# Patient Record
Sex: Male | Born: 1960 | Race: White | Hispanic: No | Smoking: Current every day smoker
Health system: Southern US, Community
[De-identification: ages and names within clinical notes are randomized; demographics above are authoritative.]

## PROBLEM LIST (undated history)

## (undated) DIAGNOSIS — A6920 Lyme disease, unspecified: Secondary | ICD-10-CM

## (undated) DIAGNOSIS — A770 Spotted fever due to Rickettsia rickettsii: Secondary | ICD-10-CM

## (undated) DIAGNOSIS — K7460 Unspecified cirrhosis of liver: Secondary | ICD-10-CM

## (undated) DIAGNOSIS — I8500 Esophageal varices without bleeding: Secondary | ICD-10-CM

## (undated) DIAGNOSIS — E119 Type 2 diabetes mellitus without complications: Secondary | ICD-10-CM

## (undated) DIAGNOSIS — F419 Anxiety disorder, unspecified: Secondary | ICD-10-CM

## (undated) DIAGNOSIS — E7800 Pure hypercholesterolemia, unspecified: Secondary | ICD-10-CM

## (undated) HISTORY — DX: Type 2 diabetes mellitus without complications: E11.9

## (undated) HISTORY — DX: Esophageal varices without bleeding: I85.00

## (undated) HISTORY — DX: Unspecified cirrhosis of liver: K74.60

## (undated) HISTORY — DX: Spotted fever due to Rickettsia rickettsii: A77.0

## (undated) HISTORY — DX: Anxiety disorder, unspecified: F41.9

## (undated) HISTORY — PX: HERNIA REPAIR: SHX51

## (undated) HISTORY — DX: Pure hypercholesterolemia, unspecified: E78.00

## (undated) HISTORY — DX: Lyme disease, unspecified: A69.20

## (undated) HISTORY — PX: ESOPHAGEAL VARICE LIGATION: SHX625

---

## 2013-06-09 ENCOUNTER — Telehealth: Payer: Self-pay | Admitting: Family Medicine

## 2013-06-09 NOTE — Telephone Encounter (Signed)
APPT MADE

## 2013-06-13 ENCOUNTER — Ambulatory Visit (INDEPENDENT_AMBULATORY_CARE_PROVIDER_SITE_OTHER): Payer: BC Managed Care – PPO | Admitting: Family Medicine

## 2013-06-13 ENCOUNTER — Encounter: Payer: Self-pay | Admitting: Family Medicine

## 2013-06-13 ENCOUNTER — Encounter (INDEPENDENT_AMBULATORY_CARE_PROVIDER_SITE_OTHER): Payer: Self-pay

## 2013-06-13 VITALS — BP 106/59 | HR 81 | Temp 97.7°F | Ht 69.0 in | Wt 183.0 lb

## 2013-06-13 DIAGNOSIS — K7460 Unspecified cirrhosis of liver: Secondary | ICD-10-CM

## 2013-06-13 NOTE — Patient Instructions (Signed)
Cirrhosis  Cirrhosis is a condition of scarring of the liver which is caused when the liver has tried repairing itself following damage. This damage may come from a previous infection such as one of the forms of hepatitis (usually hepatitis C), or the damage may come from being injured by toxins. The main toxin that causes this damage is alcohol. The scarring of the liver from use of alcohol is irreversible. That means the liver cannot return to normal even though alcohol is not used any more. The main danger of hepatitis C infection is that it may cause long-lasting (chronic) liver disease, and this also may lead to cirrhosis. This complication is progressive and irreversible.  CAUSES   Prior to available blood tests, hepatitis C could be contracted by blood transfusions. Since testing of blood has improved, this is now unlikely. This infection can also be contracted through intravenous drug use and the sharing of needles. It can also be contracted through sexual relationships. The injury caused by alcohol comes from too much use. It is not a few drinks that poison the liver, but years of misuse. Usually there will be some signs and symptoms early with scarring of the liver that suggest the development of better habits. Alcohol should never be used while using acetaminophen. A small dose of both taken together may cause irreversible damage to the liver.  HOME CARE INSTRUCTIONS   There is no specific treatment for cirrhosis. However, there are things you can do to avoid making the condition worse.  · Rest as needed.  · Eat a well-balanced diet. Your caregiver can help you with suggestions.  · Vitamin supplements including vitamins A, K, D, and thiamine can help.  · A low-salt diet, water restriction, or diuretic medicine may be needed to reduce fluid retention.  · Avoid alcohol. This can be extremely toxic if combined with acetaminophen.  · Avoid drugs which are toxic to the liver. Some of these include isoniazid,  methyldopa, acetaminophen, anabolic steroids (muscle-building drugs), erythromycin, and oral contraceptives (birth control pills). Check with your caregiver to make sure medicines you are presently taking will not be harmful.  · Periodic blood tests may be required. Follow your caregiver's advice regarding the timing of these.  · Milk thistle is an herbal remedy which does protect the liver against toxins. However, it will not help once the liver has been scarred.  SEEK MEDICAL CARE IF:  · You have increasing fatigue or weakness.  · You develop swelling of the hands, feet, legs, or face.  · You vomit bright red blood, or a coffee ground appearing material.  · You have blood in your stools, or the stools turn black and tarry.  · You have a fever.  · You develop loss of appetite, or have nausea and vomiting.  · You develop jaundice.  · You develop easy bruising or bleeding.  · You have worsening of any of the problems you are concerned about.  Document Released: 05/01/2005 Document Revised: 07/24/2011 Document Reviewed: 12/18/2007  ExitCare® Patient Information ©2014 [employer].

## 2013-06-13 NOTE — Progress Notes (Signed)
   Subjective:    Patient ID: Spencer Stanley, male    DOB: 08-03-1960, 53 y.o.   MRN: [id number]  HPI This 53 y.o. male presents for evaluation of establishment of care.  He has hx of cirrhosis. He was living in [place]. And has moved to [facility] and was being seen by GI in [place] and He states he was told to have his care with GI transferred to [facility].  He states his MELD numbers Are 8 and improved after doing TIPS surgery to bypass liver and decrease hepatic portal tension   Review of Systems No chest pain, SOB, HA, dizziness, vision change, N/V, diarrhea, constipation, dysuria, urinary urgency or frequency, myalgias, arthralgias or rash.     Objective:   Physical Exam Vital signs noted  Well developed well nourished male.  HEENT - Head atraumatic Normocephalic                Eyes - PERRLA, Conjuctiva - clear Sclera- Clear EOMI                Ears - EAC's Wnl TM's Wnl Gross Hearing WNL                Nose - Nares patent                 Throat - oropharanx wnl Respiratory - Lungs CTA bilateral Cardiac - RRR S1 and S2 without murmur GI - Abdomen soft Nontender and bowel sounds active x 4 Extremities - No edema. Neuro - Grossly intact.       Assessment & Plan:  Cirrhosis of liver without mention of alcohol - Plan: Ambulatory referral to Gastroenterology  Refer to GI at [facility] and follow up in 3 months  Lysbeth Penner FNP

## 2013-06-18 ENCOUNTER — Other Ambulatory Visit (INDEPENDENT_AMBULATORY_CARE_PROVIDER_SITE_OTHER): Payer: BC Managed Care – PPO

## 2013-06-18 DIAGNOSIS — K7200 Acute and subacute hepatic failure without coma: Secondary | ICD-10-CM

## 2013-06-18 NOTE — Progress Notes (Signed)
Fax results to [place] [facility]   [phone]

## 2013-06-19 LAB — CMP14+EGFR
ALT: 22 IU/L (ref 0–44)
AST: 30 IU/L (ref 0–40)
Albumin/Globulin Ratio: 1.4 (ref 1.1–2.5)
Albumin: 4 g/dL (ref 3.5–5.5)
Alkaline Phosphatase: 174 IU/L — ABNORMAL HIGH (ref 39–117)
BUN/Creatinine Ratio: 13 (ref 9–20)
BUN: 12 mg/dL (ref 6–24)
CO2: 20 mmol/L (ref 18–29)
Calcium: 9.5 mg/dL (ref 8.7–10.2)
Chloride: 105 mmol/L (ref 97–108)
Creatinine, Ser: 0.93 mg/dL (ref 0.76–1.27)
GFR calc Af Amer: 108 mL/min/{1.73_m2} (ref >59)
GFR calc non Af Amer: 93 mL/min/{1.73_m2} (ref >59)
Globulin, Total: 2.9 g/dL (ref 1.5–4.5)
Glucose: 111 mg/dL — ABNORMAL HIGH (ref 65–99)
Potassium: 4.5 mmol/L (ref 3.5–5.2)
Sodium: 139 mmol/L (ref 134–144)
Total Bilirubin: 0.9 mg/dL (ref 0.0–1.2)
Total Protein: 6.9 g/dL (ref 6.0–8.5)

## 2013-06-19 LAB — CBC WITH DIFFERENTIAL
Basophils Absolute: 0 10*3/uL (ref 0.0–0.2)
Basos: 1 %
Eos: 4 %
Eosinophils Absolute: 0.2 10*3/uL (ref 0.0–0.4)
HCT: 35.1 % — ABNORMAL LOW (ref 37.5–51.0)
Hemoglobin: 11.3 g/dL — ABNORMAL LOW (ref 12.6–17.7)
Immature Grans (Abs): 0 10*3/uL (ref 0.0–0.1)
Immature Granulocytes: 0 %
Lymphocytes Absolute: 1.2 10*3/uL (ref 0.7–3.1)
Lymphs: 25 %
MCH: 24.3 pg — ABNORMAL LOW (ref 26.6–33.0)
MCHC: 32.2 g/dL (ref 31.5–35.7)
MCV: 76 fL — ABNORMAL LOW (ref 79–97)
Monocytes Absolute: 0.5 10*3/uL (ref 0.1–0.9)
Monocytes: 10 %
Neutrophils Absolute: 3 10*3/uL (ref 1.4–7.0)
Neutrophils Relative %: 60 %
Platelets: 77 10*3/uL — CL (ref 150–379)
RBC: 4.65 x10E6/uL (ref 4.14–5.80)
RDW: 19.3 % — ABNORMAL HIGH (ref 12.3–15.4)
WBC: 4.9 10*3/uL (ref 3.4–10.8)

## 2013-06-19 LAB — PROTIME-INR
INR: 1.3 — ABNORMAL HIGH (ref 0.8–1.2)
Prothrombin Time: 12.8 s — ABNORMAL HIGH (ref 9.1–12.0)

## 2013-08-27 ENCOUNTER — Encounter: Payer: Self-pay | Admitting: *Deleted

## 2013-09-10 ENCOUNTER — Encounter: Payer: Self-pay | Admitting: Family Medicine

## 2013-09-10 ENCOUNTER — Ambulatory Visit (INDEPENDENT_AMBULATORY_CARE_PROVIDER_SITE_OTHER): Payer: BC Managed Care – PPO | Admitting: Family Medicine

## 2013-09-10 VITALS — BP 108/67 | HR 61 | Temp 97.2°F | Ht 69.0 in | Wt 191.0 lb

## 2013-09-10 DIAGNOSIS — G4700 Insomnia, unspecified: Secondary | ICD-10-CM

## 2013-09-10 DIAGNOSIS — F32A Depression, unspecified: Secondary | ICD-10-CM

## 2013-09-10 DIAGNOSIS — K7460 Unspecified cirrhosis of liver: Secondary | ICD-10-CM

## 2013-09-10 DIAGNOSIS — F3289 Other specified depressive episodes: Secondary | ICD-10-CM

## 2013-09-10 DIAGNOSIS — K219 Gastro-esophageal reflux disease without esophagitis: Secondary | ICD-10-CM

## 2013-09-10 DIAGNOSIS — M1990 Unspecified osteoarthritis, unspecified site: Secondary | ICD-10-CM

## 2013-09-10 DIAGNOSIS — F329 Major depressive disorder, single episode, unspecified: Secondary | ICD-10-CM

## 2013-09-10 MED ORDER — MELOXICAM 15 MG PO TABS: 15.0000 mg | ORAL_TABLET | Freq: Every day | ORAL | Status: DC

## 2013-09-10 MED ORDER — FLUOXETINE HCL 40 MG PO CAPS: 40.0000 mg | ORAL_CAPSULE | Freq: Every day | ORAL | Status: DC

## 2013-09-10 MED ORDER — ZOLPIDEM TARTRATE 10 MG PO TABS: 10.0000 mg | ORAL_TABLET | Freq: Every evening | ORAL | Status: DC | PRN

## 2013-09-10 MED ORDER — LACTULOSE 10 GM/15ML PO SOLN: 30.0000 g | Freq: Three times a day (TID) | ORAL | Status: DC

## 2013-09-10 MED ORDER — PANTOPRAZOLE SODIUM 40 MG PO TBEC: 40.0000 mg | DELAYED_RELEASE_TABLET | Freq: Every day | ORAL | Status: DC

## 2013-09-10 NOTE — Progress Notes (Signed)
   Subjective:    Patient ID: Spencer Stanley, male    DOB: 28-Jul-1960, 53 y.o.   MRN: [id number]  HPI This 53 y.o. male presents for evaluation of follow up.  He has hx of cirrhosis and he has an appointment with his hepatologist. He has hx of insomnia, gerd, and OA.  He needs refills.   Review of Systems    No chest pain, SOB, HA, dizziness, vision change, N/V, diarrhea, constipation, dysuria, urinary urgency or frequency, myalgias, arthralgias or rash.  Objective:   Physical Exam  Vital signs noted  Well developed well nourished male.  HEENT - Head atraumatic Normocephalic                Eyes - PERRLA, Conjuctiva - clear Sclera- Clear EOMI                Ears - EAC's Wnl TM's Wnl Gross Hearing WNL                Nose - Nares patent                 Throat - oropharanx wnl Respiratory - Lungs CTA bilateral Cardiac - RRR S1 and S2 without murmur GI - Abdomen soft Nontender and bowel sounds active x 4 Extremities - No edema. Neuro - Grossly intact.      Assessment & Plan:  Cirrhosis - Plan: lactulose (CHRONULAC) 10 GM/15ML solution, CMP14+EGFR, CBC With differential/Platelet, CANCELED: POCT CBC  Insomnia - Plan: zolpidem (AMBIEN) 10 MG tablet, CBC With differential/Platelet  GERD (gastroesophageal reflux disease) - Plan: pantoprazole (PROTONIX) 40 MG tablet, CBC With differential/Platelet  Depression - Plan: FLUoxetine (PROZAC) 40 MG capsule, CBC With differential/Platelet  OA (osteoarthritis) - Plan: meloxicam (MOBIC) 15 MG tablet, CBC With differential/Platelet  Lysbeth Penner FNP

## 2013-09-11 ENCOUNTER — Ambulatory Visit: Payer: BC Managed Care – PPO | Admitting: Family Medicine

## 2013-09-12 ENCOUNTER — Other Ambulatory Visit (INDEPENDENT_AMBULATORY_CARE_PROVIDER_SITE_OTHER): Payer: BC Managed Care – PPO

## 2013-09-12 DIAGNOSIS — R7989 Other specified abnormal findings of blood chemistry: Secondary | ICD-10-CM

## 2013-09-12 LAB — SPECIMEN STATUS REPORT

## 2013-09-12 NOTE — Progress Notes (Signed)
Pt came in for labs only 

## 2013-09-13 LAB — CBC WITH DIFFERENTIAL
Basophils Absolute: 0 10*3/uL (ref 0.0–0.2)
Basos: 1 %
Eos: 4 %
Eosinophils Absolute: 0.2 10*3/uL (ref 0.0–0.4)
HCT: 35.2 % — ABNORMAL LOW (ref 37.5–51.0)
Hemoglobin: 11 g/dL — ABNORMAL LOW (ref 12.6–17.7)
Immature Grans (Abs): 0 10*3/uL (ref 0.0–0.1)
Immature Granulocytes: 0 %
Lymphocytes Absolute: 0.9 10*3/uL (ref 0.7–3.1)
Lymphs: 19 %
MCH: 24.4 pg — ABNORMAL LOW (ref 26.6–33.0)
MCHC: 31.3 g/dL — ABNORMAL LOW (ref 31.5–35.7)
MCV: 78 fL — ABNORMAL LOW (ref 79–97)
Monocytes Absolute: 0.6 10*3/uL (ref 0.1–0.9)
Monocytes: 12 %
Neutrophils Absolute: 3 10*3/uL (ref 1.4–7.0)
Neutrophils Relative %: 64 %
Platelets: 91 10*3/uL — CL (ref 150–379)
RBC: 4.5 x10E6/uL (ref 4.14–5.80)
RDW: 18.1 % — ABNORMAL HIGH (ref 12.3–15.4)
WBC: 4.8 10*3/uL (ref 3.4–10.8)

## 2013-09-15 LAB — CBC WITH DIFFERENTIAL

## 2013-09-16 LAB — CMP14+EGFR
ALT: 19 IU/L (ref 0–44)
AST: 29 IU/L (ref 0–40)
Albumin/Globulin Ratio: 1.4 (ref 1.1–2.5)
Albumin: 3.8 g/dL (ref 3.5–5.5)
Alkaline Phosphatase: 172 IU/L — ABNORMAL HIGH (ref 39–117)
BUN/Creatinine Ratio: 16 (ref 9–20)
BUN: 13 mg/dL (ref 6–24)
CO2: 24 mmol/L (ref 18–29)
Calcium: 8.9 mg/dL (ref 8.7–10.2)
Chloride: 104 mmol/L (ref 97–108)
Creatinine, Ser: 0.83 mg/dL (ref 0.76–1.27)
GFR calc Af Amer: 116 mL/min/{1.73_m2} (ref >59)
GFR calc non Af Amer: 100 mL/min/{1.73_m2} (ref >59)
Globulin, Total: 2.8 g/dL (ref 1.5–4.5)
Glucose: 113 mg/dL — ABNORMAL HIGH (ref 65–99)
Potassium: 4.3 mmol/L (ref 3.5–5.2)
Sodium: 140 mmol/L (ref 134–144)
Total Bilirubin: 0.9 mg/dL (ref 0.0–1.2)
Total Protein: 6.6 g/dL (ref 6.0–8.5)

## 2013-09-16 LAB — SPECIMEN STATUS REPORT

## 2013-09-22 ENCOUNTER — Telehealth: Payer: Self-pay | Admitting: Family Medicine

## 2013-09-22 NOTE — Telephone Encounter (Signed)
Message copied by Waverly Ferrari on Mon Sep 22, 2013 10:11 AM ------      Message from: Lysbeth Penner      Created: Tue Sep 16, 2013  7:14 PM       Platelets low due to cirrhosis ------

## 2013-09-26 DIAGNOSIS — Z9889 Other specified postprocedural states: Secondary | ICD-10-CM | POA: Insufficient documentation

## 2013-09-26 DIAGNOSIS — K7460 Unspecified cirrhosis of liver: Secondary | ICD-10-CM | POA: Insufficient documentation

## 2013-10-14 ENCOUNTER — Telehealth: Payer: Self-pay | Admitting: Family Medicine

## 2013-10-14 DIAGNOSIS — K219 Gastro-esophageal reflux disease without esophagitis: Secondary | ICD-10-CM

## 2013-10-14 DIAGNOSIS — F329 Major depressive disorder, single episode, unspecified: Secondary | ICD-10-CM

## 2013-10-14 DIAGNOSIS — F32A Depression, unspecified: Secondary | ICD-10-CM

## 2013-10-14 MED ORDER — FLUOXETINE HCL 40 MG PO CAPS: 40.0000 mg | ORAL_CAPSULE | Freq: Every day | ORAL | Status: DC

## 2013-10-14 MED ORDER — PANTOPRAZOLE SODIUM 40 MG PO TBEC: 40.0000 mg | DELAYED_RELEASE_TABLET | Freq: Every day | ORAL | Status: DC

## 2013-10-14 NOTE — Telephone Encounter (Signed)
This has been done and pt aware

## 2013-11-17 ENCOUNTER — Telehealth: Payer: Self-pay | Admitting: Family Medicine

## 2013-11-18 ENCOUNTER — Other Ambulatory Visit: Payer: Self-pay | Admitting: Family Medicine

## 2013-11-18 MED ORDER — RIFAXIMIN 550 MG PO TABS: 550.0000 mg | ORAL_TABLET | Freq: Two times a day (BID) | ORAL | Status: DC

## 2013-11-18 MED ORDER — SPIRONOLACTONE 25 MG PO TABS: 25.0000 mg | ORAL_TABLET | Freq: Two times a day (BID) | ORAL | Status: DC

## 2013-11-18 NOTE — Telephone Encounter (Signed)
meds sent to pharm

## 2013-12-12 ENCOUNTER — Encounter: Payer: Self-pay | Admitting: Family Medicine

## 2013-12-12 ENCOUNTER — Ambulatory Visit (INDEPENDENT_AMBULATORY_CARE_PROVIDER_SITE_OTHER): Payer: BC Managed Care – PPO | Admitting: Family Medicine

## 2013-12-12 VITALS — BP 103/65 | HR 78 | Temp 97.5°F | Ht 69.0 in | Wt 193.4 lb

## 2013-12-12 DIAGNOSIS — K7460 Unspecified cirrhosis of liver: Secondary | ICD-10-CM

## 2013-12-12 DIAGNOSIS — R5383 Other fatigue: Secondary | ICD-10-CM

## 2013-12-12 DIAGNOSIS — T148 Other injury of unspecified body region: Secondary | ICD-10-CM

## 2013-12-12 DIAGNOSIS — R5381 Other malaise: Secondary | ICD-10-CM

## 2013-12-12 DIAGNOSIS — W57XXXA Bitten or stung by nonvenomous insect and other nonvenomous arthropods, initial encounter: Secondary | ICD-10-CM

## 2013-12-12 LAB — POCT CBC
Granulocyte percent: 64.6 %G (ref 37–80)
HCT, POC: 40.5 % — AB (ref 43.5–53.7)
Hemoglobin: 12.6 g/dL — AB (ref 14.1–18.1)
Lymph, poc: 1.3 (ref 0.6–3.4)
MCH, POC: 26.4 pg — AB (ref 27–31.2)
MCHC: 31.1 g/dL — AB (ref 31.8–35.4)
MCV: 84.9 fL (ref 80–97)
MPV: 8.8 fL (ref 0–99.8)
POC Granulocyte: 3.2 (ref 2–6.9)
POC LYMPH PERCENT: 26.2 %L (ref 10–50)
Platelet Count, POC: 80 10*3/uL — AB (ref 142–424)
RBC: 4.8 M/uL (ref 4.69–6.13)
RDW, POC: 18.5 %
WBC: 5 10*3/uL (ref 4.6–10.2)

## 2013-12-12 NOTE — Progress Notes (Signed)
   Subjective:    Patient ID: Spencer Stanley, male    DOB: [birth year], 53 y.o.   MRN: [id number]  HPI This 53 y.o. male presents for evaluation of routine follow up.  He has been seeing GI for cirrhosis and he was recently referred to [facility].  He has been fatigued.  He has been taking xifaxan and lactulose. He has recently quit smoking as advised so he can get liver transplant.  He has been bitten by a tick And does not have sx's.   Review of Systems C/o fatigue and tick bite No chest pain, SOB, HA, dizziness, vision change, N/V, diarrhea, constipation, dysuria, urinary urgency or frequency, myalgias, arthralgias or rash.     Objective:   Physical Exam  Vital signs noted  Well developed well nourished male.  HEENT - Head atraumatic Normocephalic                Eyes - PERRLA, Conjuctiva - clear Sclera- Clear EOMI                Ears - EAC's Wnl TM's Wnl Gross Hearing WNL                Throat - oropharanx wnl Respiratory - Lungs CTA bilateral Cardiac - RRR S1 and S2 without murmur GI - Abdomen soft Nontender and bowel sounds active x 4 Extremities - No edema. Neuro - Grossly intact.      Assessment & Plan:  Cirrhosis of liver without mention of alcohol - Plan: CMP14+EGFR, POCT CBC, Vitamin B12, TSH, Vit D  25 hydroxy (rtn osteoporosis monitoring)  Other malaise and fatigue - Plan: CMP14+EGFR, POCT CBC, Vitamin B12, TSH, Vit D  25 hydroxy (rtn osteoporosis monitoring)  Tick bite - Plan: Lyme Ab/Western Blot Reflex, [place] spotted fvr abs pnl(IgG+IgM)  Follow up in 4 months  Lysbeth Penner FNP

## 2013-12-17 ENCOUNTER — Other Ambulatory Visit: Payer: Self-pay | Admitting: Family Medicine

## 2013-12-17 LAB — LYME, WESTERN BLOT, SERUM (REFLEXED)
IgG P18 Ab.: ABSENT
IgG P23 Ab.: ABSENT
IgG P28 Ab.: ABSENT
IgG P30 Ab.: ABSENT
IgG P39 Ab.: ABSENT
IgG P41 Ab.: ABSENT
IgG P45 Ab.: ABSENT
IgG P58 Ab.: ABSENT
IgG P66 Ab.: ABSENT
IgG P93 Ab.: ABSENT
IgM P39 Ab.: ABSENT
IgM P41 Ab.: ABSENT
Lyme IgG Wb: NEGATIVE
Lyme IgM Wb: NEGATIVE

## 2013-12-17 LAB — CMP14+EGFR
ALT: 22 IU/L (ref 0–44)
AST: 31 IU/L (ref 0–40)
Albumin/Globulin Ratio: 1.4 (ref 1.1–2.5)
Albumin: 3.9 g/dL (ref 3.5–5.5)
Alkaline Phosphatase: 151 IU/L — ABNORMAL HIGH (ref 39–117)
BUN/Creatinine Ratio: 13 (ref 9–20)
BUN: 11 mg/dL (ref 6–24)
CO2: 24 mmol/L (ref 18–29)
Calcium: 9.4 mg/dL (ref 8.7–10.2)
Chloride: 103 mmol/L (ref 97–108)
Creatinine, Ser: 0.88 mg/dL (ref 0.76–1.27)
GFR calc Af Amer: 113 mL/min/{1.73_m2} (ref >59)
GFR calc non Af Amer: 97 mL/min/{1.73_m2} (ref >59)
Globulin, Total: 2.7 g/dL (ref 1.5–4.5)
Glucose: 162 mg/dL — ABNORMAL HIGH (ref 65–99)
Potassium: 4.2 mmol/L (ref 3.5–5.2)
Sodium: 140 mmol/L (ref 134–144)
Total Bilirubin: 0.8 mg/dL (ref 0.0–1.2)
Total Protein: 6.6 g/dL (ref 6.0–8.5)

## 2013-12-17 LAB — TSH: TSH: 1.89 u[IU]/mL (ref 0.450–4.500)

## 2013-12-17 LAB — ROCKY MTN SPOTTED FVR ABS PNL(IGG+IGM)
RMSF IgG: NEGATIVE
RMSF IgM: 1.2 index — ABNORMAL HIGH (ref 0.00–0.89)

## 2013-12-17 LAB — LYME AB/WESTERN BLOT REFLEX
LYME DISEASE AB, QUANT, IGM: 1.18 index — ABNORMAL HIGH (ref 0.00–0.79)
Lyme IgG/IgM Ab: <0.91 {ISR} (ref 0.00–0.90)

## 2013-12-17 LAB — VITAMIN D 25 HYDROXY (VIT D DEFICIENCY, FRACTURES): Vit D, 25-Hydroxy: 59.1 ng/mL (ref 30.0–100.0)

## 2013-12-17 LAB — VITAMIN B12: Vitamin B-12: 937 pg/mL (ref 211–946)

## 2013-12-17 MED ORDER — DOXYCYCLINE HYCLATE 100 MG PO TABS: 100.0000 mg | ORAL_TABLET | Freq: Two times a day (BID) | ORAL | Status: DC

## 2013-12-23 ENCOUNTER — Encounter: Payer: Self-pay | Admitting: Family Medicine

## 2014-04-15 ENCOUNTER — Telehealth: Payer: Self-pay | Admitting: Family Medicine

## 2014-04-17 NOTE — Telephone Encounter (Signed)
Please respond

## 2014-04-18 ENCOUNTER — Other Ambulatory Visit: Payer: Self-pay | Admitting: Family Medicine

## 2014-04-18 DIAGNOSIS — K769 Liver disease, unspecified: Secondary | ICD-10-CM

## 2014-04-18 NOTE — Telephone Encounter (Signed)
Referral put in.

## 2014-05-11 ENCOUNTER — Ambulatory Visit: Payer: BC Managed Care – PPO | Admitting: Family Medicine

## 2014-05-25 ENCOUNTER — Telehealth: Payer: Self-pay | Admitting: Family Medicine

## 2014-06-19 ENCOUNTER — Ambulatory Visit (INDEPENDENT_AMBULATORY_CARE_PROVIDER_SITE_OTHER): Payer: Commercial Managed Care - HMO | Admitting: Family Medicine

## 2014-06-19 ENCOUNTER — Encounter: Payer: Self-pay | Admitting: Family Medicine

## 2014-06-19 VITALS — BP 108/65 | HR 101 | Temp 97.0°F | Ht 69.0 in | Wt 193.0 lb

## 2014-06-19 DIAGNOSIS — R5382 Chronic fatigue, unspecified: Secondary | ICD-10-CM | POA: Diagnosis not present

## 2014-06-19 DIAGNOSIS — Z23 Encounter for immunization: Secondary | ICD-10-CM

## 2014-06-19 DIAGNOSIS — K7030 Alcoholic cirrhosis of liver without ascites: Secondary | ICD-10-CM

## 2014-06-19 MED ORDER — MINOCYCLINE HCL 100 MG PO CAPS: 100.0000 mg | ORAL_CAPSULE | Freq: Two times a day (BID) | ORAL | Status: DC

## 2014-06-19 NOTE — Progress Notes (Signed)
Patient ID: Spencer Stanley, male    DOB: 11-22-1960  Age: 54 y.o. MRN: [id number]    Subjective:   HPI Spencer Stanley presents for  Chief Complaint  Patient presents with  . Medication Management    approved for Medicare as of 06/15/14. Xifaxan is going to be too expensive under his new coverage.   . Abscess    multiple abscesses on back  . Tongue pain    Pain at base of tongue. It has improved some since he d/c smoking.    patient has been treated for cirrhosis and is trying to obtain referral to [facility] for evaluation for this for liver transplant. His condition is considered not severe enough for that at this time. 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 He suffers severe nausea and has been treated with Xifaxan but can no longer afford that due to insurance blocking payment. His wife tells me the cost of the Xifaxan alone is to be over $500. He has had multiple episodes of esophageal bleed due to varices and multiple transfusions from the bleeding. He suffers from chronic fatigue since his past infection with [facility] spotted fever and Lyme's disease. Due to all of the above he suffers now chronic anxiety which has been controlled well with current medication.  Review of Systems  Constitutional: Negative for fever, chills, diaphoresis and unexpected weight change.  HENT: Negative for congestion, hearing loss, rhinorrhea, sore throat and trouble swallowing.   Respiratory: Negative for cough, chest tightness, shortness of breath and wheezing.   Gastrointestinal: Negative for nausea, vomiting, abdominal pain, diarrhea, constipation and abdominal distention.  Endocrine: Negative for cold intolerance and heat intolerance.  Genitourinary: Negative for dysuria, hematuria and flank pain.  Musculoskeletal: Negative for joint swelling and arthralgias.  Skin: Negative for rash.    Neurological: Negative for dizziness and headaches.  Psychiatric/Behavioral: Negative for dysphoric mood, decreased concentration and agitation. The patient is not nervous/anxious.     History Spencer Stanley has a past medical history of Esophageal varices; Cirrhosis; Anxiety; Lyme disease; and [facility] spotted fever.   He has past surgical history that includes Hernia repair and Esophageal varice ligation.   Hisfamily history includes Aneurysm in his mother; Cancer in his father and mother; Dementia in his father; Diabetes in his sister; Heart failure in his father.He reports that he has been smoking.  He does not have any smokeless tobacco history on file. He reports that he does not drink alcohol or use illicit drugs.  Current Outpatient Prescriptions on File Prior to Visit  Medication Sig Dispense Refill  . albuterol (PROVENTIL HFA;VENTOLIN HFA) 108 (90 BASE) MCG/ACT inhaler Inhale 2 puffs into the lungs every 6 (six) hours as needed for wheezing or shortness of breath.    Marland Kitchen FLUoxetine (PROZAC) 40 MG capsule Take 1 capsule (40 mg total) by mouth daily. 30 capsule 5  . multivitamin (ONE-A-DAY MEN'S) TABS tablet Take 1 tablet by mouth daily.    . pantoprazole (PROTONIX) 40 MG tablet Take 1 tablet (40 mg total) by mouth daily. 30 tablet 5  . rifaximin (XIFAXAN) 550 MG TABS tablet Take 1 tablet (550 mg total) by mouth 2 (two) times daily. 60 tablet 5  . spironolactone (ALDACTONE) 25 MG tablet Take 1 tablet (25 mg total) by mouth 2 (two) times daily. 60 tablet 5  . zolpidem (AMBIEN) 10 MG tablet Take 1 tablet (10 mg total)  by mouth at bedtime as needed for sleep. 30 tablet 3  . lactulose (CHRONULAC) 10 GM/15ML solution Take 45 mLs (30 g total) by mouth 3 (three) times daily. 240 mL 11  . propranolol (INDERAL) 60 MG tablet Take 60 mg by mouth.      No current facility-administered medications on file prior to visit.    Objective:  Physical Exam  Constitutional: He is oriented to person,  place, and time. He appears well-developed and well-nourished. No distress.  HENT:  Head: Normocephalic and atraumatic.  Right Ear: External ear normal.  Left Ear: External ear normal.  Nose: Nose normal.  Mouth/Throat: Oropharynx is clear and moist.  Eyes: Conjunctivae and EOM are normal. Pupils are equal, round, and reactive to light.  Neck: Normal range of motion. Neck supple. No thyromegaly present.  Cardiovascular: Normal rate, regular rhythm and normal heart sounds.   No murmur heard. Pulmonary/Chest: Effort normal and breath sounds normal. No respiratory distress. He has no wheezes. He has no rales.  Abdominal: Soft. Bowel sounds are normal. He exhibits no distension. There is no tenderness.  Lymphadenopathy:    He has no cervical adenopathy.  Neurological: He is alert and oriented to person, place, and time. He has normal reflexes.  Skin: Skin is warm and dry.  Psychiatric: He has a normal mood and affect. His behavior is normal. Judgment and thought content normal.   BP 108/65 mmHg  Pulse 101  Temp(Src) 97 F (36.1 C) (Oral)  Ht '5\' 9"'  (1.753 m)  Wt 193 lb (87.544 kg)  BMI 28.49 kg/m2 Wt Readings from Last 3 Encounters:  06/19/14 193 lb (87.544 kg)  12/12/13 193 lb 6.4 oz (87.726 kg)  09/10/13 191 lb (86.637 kg)     Lab Results  Component Value Date   WBC 5.0 12/12/2013   HGB 12.6* 12/12/2013   HCT 40.5* 12/12/2013   PLT 91* 09/12/2013   GLUCOSE 162* 12/12/2013   ALT 22 12/12/2013   AST 31 12/12/2013   NA 140 12/12/2013   K 4.2 12/12/2013   CL 103 12/12/2013   CREATININE 0.88 12/12/2013   BUN 11 12/12/2013   CO2 24 12/12/2013   TSH 1.890 12/12/2013   INR 1.3* 06/18/2013    Patient was never admitted.  Assessment & Plan:   Spencer Stanley was seen today for medication management, abscess and tongue pain.  Diagnoses and associated orders for this visit:  Chronic fatigue - Lyme Ab/Western Blot Reflex - Thyroid Panel With TSH - Testosterone,Free and  Total  Alcoholic cirrhosis of liver without ascites - Ammonia - POCT CBC - CMP14+EGFR  Other Orders - Discontinue: minocycline (MINOCIN) 100 MG capsule; Take 1 capsule (100 mg total) by mouth 2 (two) times daily. - minocycline (MINOCIN) 100 MG capsule; Take 1 capsule (100 mg total) by mouth 2 (two) times daily.    I have discontinued Mr. Hurn Calcium Carb-Cholecalciferol and doxycycline. I have also changed his minocycline. Additionally, I am having him maintain his multivitamin, albuterol, lactulose, zolpidem, FLUoxetine, pantoprazole, rifaximin, spironolactone, propranolol, and CALCIUM-MAGNESIUM-ZINC PO.  Meds ordered this encounter  Medications  . CALCIUM-MAGNESIUM-ZINC PO    Sig: Take 1 capsule by mouth 2 (two) times daily.  Marland Kitchen DISCONTD: minocycline (MINOCIN) 100 MG capsule    Sig: Take 1 capsule (100 mg total) by mouth 2 (two) times daily.    Dispense:  60 capsule    Refill:  5  . minocycline (MINOCIN) 100 MG capsule    Sig: Take 1 capsule (100 mg total)  by mouth 2 (two) times daily.    Dispense:  60 capsule    Refill:  5    Follow-up: Return in about 6 months (around 12/18/2014) for cirrhosis.  Claretta Fraise, M.D.

## 2014-06-19 NOTE — Progress Notes (Signed)
Tdap cost is $62 today. He will wait on this.

## 2014-06-20 ENCOUNTER — Other Ambulatory Visit: Payer: Commercial Managed Care - HMO

## 2014-06-20 DIAGNOSIS — K7030 Alcoholic cirrhosis of liver without ascites: Secondary | ICD-10-CM | POA: Diagnosis not present

## 2014-06-20 DIAGNOSIS — R5382 Chronic fatigue, unspecified: Secondary | ICD-10-CM | POA: Diagnosis not present

## 2014-06-20 DIAGNOSIS — R5383 Other fatigue: Secondary | ICD-10-CM | POA: Diagnosis not present

## 2014-06-22 LAB — THYROID PANEL WITH TSH
Free Thyroxine Index: 1.6 (ref 1.2–4.9)
T3 UPTAKE RATIO: 26 % (ref 24–39)
T4, Total: 6 ug/dL (ref 4.5–12.0)
TSH: 1.26 u[IU]/mL (ref 0.450–4.500)

## 2014-06-22 LAB — CMP14+EGFR
A/G RATIO: 1.2 (ref 1.1–2.5)
ALBUMIN: 3.7 g/dL (ref 3.5–5.5)
ALT: 19 IU/L (ref 0–44)
AST: 29 IU/L (ref 0–40)
Alkaline Phosphatase: 152 IU/L — ABNORMAL HIGH (ref 39–117)
BILIRUBIN TOTAL: 0.9 mg/dL (ref 0.0–1.2)
BUN/Creatinine Ratio: 7 — ABNORMAL LOW (ref 9–20)
BUN: 5 mg/dL — ABNORMAL LOW (ref 6–24)
CALCIUM: 9.2 mg/dL (ref 8.7–10.2)
CHLORIDE: 101 mmol/L (ref 97–108)
CO2: 22 mmol/L (ref 18–29)
Creatinine, Ser: 0.69 mg/dL — ABNORMAL LOW (ref 0.76–1.27)
GFR calc Af Amer: 124 mL/min/{1.73_m2} (ref >59)
GFR calc non Af Amer: 108 mL/min/{1.73_m2} (ref >59)
Globulin, Total: 3.2 g/dL (ref 1.5–4.5)
Glucose: 293 mg/dL — ABNORMAL HIGH (ref 65–99)
Potassium: 3.6 mmol/L (ref 3.5–5.2)
SODIUM: 143 mmol/L (ref 134–144)
Total Protein: 6.9 g/dL (ref 6.0–8.5)

## 2014-06-22 LAB — LYME AB/WESTERN BLOT REFLEX: Lyme IgG/IgM Ab: <0.91 {ISR} (ref 0.00–0.90)

## 2014-06-22 LAB — AMMONIA: Ammonia: 149 ug/dL (ref 27–102)

## 2014-06-22 LAB — TESTOSTERONE,FREE AND TOTAL
TESTOSTERONE FREE: 9.6 pg/mL (ref 7.2–24.0)
TESTOSTERONE: 913 ng/dL (ref 348–1197)

## 2014-06-30 DIAGNOSIS — K7030 Alcoholic cirrhosis of liver without ascites: Secondary | ICD-10-CM | POA: Diagnosis not present

## 2014-06-30 DIAGNOSIS — I8390 Asymptomatic varicose veins of unspecified lower extremity: Secondary | ICD-10-CM | POA: Diagnosis not present

## 2014-06-30 DIAGNOSIS — Z716 Tobacco abuse counseling: Secondary | ICD-10-CM | POA: Diagnosis not present

## 2014-06-30 DIAGNOSIS — R609 Edema, unspecified: Secondary | ICD-10-CM | POA: Diagnosis not present

## 2014-06-30 DIAGNOSIS — Z9889 Other specified postprocedural states: Secondary | ICD-10-CM | POA: Diagnosis not present

## 2014-07-02 ENCOUNTER — Other Ambulatory Visit: Payer: Self-pay

## 2014-07-02 DIAGNOSIS — F329 Major depressive disorder, single episode, unspecified: Secondary | ICD-10-CM

## 2014-07-02 DIAGNOSIS — F32A Depression, unspecified: Secondary | ICD-10-CM

## 2014-07-02 MED ORDER — SPIRONOLACTONE 25 MG PO TABS: 25.0000 mg | ORAL_TABLET | Freq: Two times a day (BID) | ORAL | Status: DC

## 2014-07-02 MED ORDER — PANTOPRAZOLE SODIUM 40 MG PO TBEC: 40.0000 mg | DELAYED_RELEASE_TABLET | Freq: Every day | ORAL | Status: DC

## 2014-07-02 MED ORDER — FLUOXETINE HCL 40 MG PO CAPS: 40.0000 mg | ORAL_CAPSULE | Freq: Every day | ORAL | Status: DC

## 2014-07-03 ENCOUNTER — Other Ambulatory Visit: Payer: Self-pay | Admitting: *Deleted

## 2014-07-03 DIAGNOSIS — G4700 Insomnia, unspecified: Secondary | ICD-10-CM

## 2014-07-03 NOTE — Telephone Encounter (Signed)
Last seen 07/02/14, wants printed, he will pick up, call when ready

## 2014-07-08 ENCOUNTER — Other Ambulatory Visit: Payer: Self-pay | Admitting: *Deleted

## 2014-07-08 DIAGNOSIS — F329 Major depressive disorder, single episode, unspecified: Secondary | ICD-10-CM

## 2014-07-08 DIAGNOSIS — F32A Depression, unspecified: Secondary | ICD-10-CM

## 2014-07-08 MED ORDER — SPIRONOLACTONE 25 MG PO TABS: 25.0000 mg | ORAL_TABLET | Freq: Two times a day (BID) | ORAL | Status: DC

## 2014-07-08 MED ORDER — FLUOXETINE HCL 40 MG PO CAPS: 40.0000 mg | ORAL_CAPSULE | Freq: Every day | ORAL | Status: DC

## 2014-07-08 MED ORDER — PANTOPRAZOLE SODIUM 40 MG PO TBEC: 40.0000 mg | DELAYED_RELEASE_TABLET | Freq: Every day | ORAL | Status: DC

## 2014-07-08 NOTE — Telephone Encounter (Signed)
Please review and advise.

## 2014-07-09 MED ORDER — ZOLPIDEM TARTRATE 10 MG PO TABS: 10.0000 mg | ORAL_TABLET | Freq: Every evening | ORAL | Status: DC | PRN

## 2014-07-09 NOTE — Telephone Encounter (Signed)
Pt wants a 60 day written RX to mail in for mail order Please review and advise

## 2014-07-10 NOTE — Telephone Encounter (Signed)
Written prescription okay. I will sign it when I return on Monday or have the Saturday provider sign it

## 2014-07-13 MED ORDER — ZOLPIDEM TARTRATE 10 MG PO TABS
10.0000 mg | ORAL_TABLET | Freq: Every evening | ORAL | Status: DC | PRN
Start: 2014-07-13 — End: 2014-12-22

## 2014-07-13 NOTE — Telephone Encounter (Signed)
RX printed for Dr Livia Snellen to sign

## 2014-07-13 NOTE — Addendum Note (Signed)
Addended by: Marin Olp on: 07/13/2014 07:26 PM   Modules accepted: Orders

## 2014-07-21 DIAGNOSIS — K7030 Alcoholic cirrhosis of liver without ascites: Secondary | ICD-10-CM | POA: Diagnosis not present

## 2014-08-05 ENCOUNTER — Ambulatory Visit: Payer: Commercial Managed Care - HMO | Admitting: Family Medicine

## 2014-08-24 ENCOUNTER — Encounter: Payer: Self-pay | Admitting: Family Medicine

## 2014-08-24 ENCOUNTER — Ambulatory Visit (INDEPENDENT_AMBULATORY_CARE_PROVIDER_SITE_OTHER): Payer: Commercial Managed Care - HMO | Admitting: Family Medicine

## 2014-08-24 VITALS — BP 121/76 | HR 91 | Temp 98.3°F | Ht 69.0 in | Wt 190.0 lb

## 2014-08-24 DIAGNOSIS — R7309 Other abnormal glucose: Secondary | ICD-10-CM

## 2014-08-24 DIAGNOSIS — R739 Hyperglycemia, unspecified: Secondary | ICD-10-CM

## 2014-08-24 DIAGNOSIS — E785 Hyperlipidemia, unspecified: Secondary | ICD-10-CM | POA: Diagnosis not present

## 2014-08-24 LAB — GLUCOSE, POCT (MANUAL RESULT ENTRY): POC Glucose: 339 mg/dl — AB (ref 70–99)

## 2014-08-24 LAB — POCT GLYCOSYLATED HEMOGLOBIN (HGB A1C): Hemoglobin A1C: 12

## 2014-08-24 LAB — POCT UA - MICROALBUMIN: MICROALBUMIN (UR) POC: 100 mg/L

## 2014-08-24 MED ORDER — METFORMIN HCL ER 750 MG PO TB24: 1500.0000 mg | ORAL_TABLET | Freq: Every day | ORAL | Status: DC

## 2014-08-24 MED ORDER — BLOOD GLUCOSE MONITOR KIT: PACK | Status: DC

## 2014-08-24 NOTE — Progress Notes (Signed)
Subjective:  Patient ID: Spencer Stanley, male    DOB: 1960/08/22  Age: 54 y.o. MRN: [id number]  CC: Insomnia; increased thirst; and Urinary Frequency   HPI [name] presents for symptoms noted above. Of note is that he has not had a diagnosis of diabetes but has had elevated blood sugar on 2 occasions. He is having polyuria as well as polydipsia. No vision changes reported. Symptoms onset over the last 2-3 weeks.  History [name] has a past medical history of Esophageal varices; Cirrhosis; Anxiety; Lyme disease; and [facility] spotted fever.   He has past surgical history that includes Hernia repair and Esophageal varice ligation.   His family history includes Aneurysm in his mother; Cancer in his father and mother; Dementia in his father; Diabetes in his sister; Heart failure in his father.He reports that he has been smoking.  He does not have any smokeless tobacco history on file. He reports that he does not drink alcohol or use illicit drugs.  Current Outpatient Prescriptions on File Prior to Visit  Medication Sig Dispense Refill  . albuterol (PROVENTIL HFA;VENTOLIN HFA) 108 (90 BASE) MCG/ACT inhaler Inhale 2 puffs into the lungs every 6 (six) hours as needed for wheezing or shortness of breath.    Marland Kitchen CALCIUM-MAGNESIUM-ZINC PO Take 1 capsule by mouth 2 (two) times daily.    Marland Kitchen FLUoxetine (PROZAC) 40 MG capsule Take 1 capsule (40 mg total) by mouth daily. 90 capsule 1  . lactulose (CHRONULAC) 10 GM/15ML solution Take 45 mLs (30 g total) by mouth 3 (three) times daily. 240 mL 11  . minocycline (MINOCIN) 100 MG capsule Take 1 capsule (100 mg total) by mouth 2 (two) times daily. 60 capsule 5  . multivitamin (ONE-A-DAY MEN'S) TABS tablet Take 1 tablet by mouth daily.    . pantoprazole (PROTONIX) 40 MG tablet Take 1 tablet (40 mg total) by mouth daily. 90 tablet 1  . propranolol (INDERAL) 60 MG tablet Take 60 mg by mouth.     . rifaximin (XIFAXAN) 550 MG TABS tablet Take 1 tablet (550 mg  total) by mouth 2 (two) times daily. 60 tablet 5  . spironolactone (ALDACTONE) 25 MG tablet Take 1 tablet (25 mg total) by mouth 2 (two) times daily. 180 tablet 1  . zolpidem (AMBIEN) 10 MG tablet Take 1 tablet (10 mg total) by mouth at bedtime as needed for sleep. 90 tablet 1   No current facility-administered medications on file prior to visit.    ROS Review of Systems  Objective:  BP 121/76 mmHg  Pulse 91  Temp(Src) 98.3 F (36.8 C) (Oral)  Ht '5\' 9"'  (1.753 m)  Wt 190 lb (86.183 kg)  BMI 28.05 kg/m2  BP Readings from Last 3 Encounters:  08/24/14 121/76  06/19/14 108/65  12/12/13 103/65    Wt Readings from Last 3 Encounters:  08/24/14 190 lb (86.183 kg)  06/19/14 193 lb (87.544 kg)  12/12/13 193 lb 6.4 oz (87.726 kg)     Physical Exam  Lab Results  Component Value Date   HGBA1C 12.0% 08/24/2014    Lab Results  Component Value Date   WBC 5.0 12/12/2013   HGB 12.6* 12/12/2013   HCT 40.5* 12/12/2013   PLT 91* 09/12/2013   GLUCOSE 293* 06/20/2014   ALT 19 06/20/2014   AST 29 06/20/2014   NA 143 06/20/2014   K 3.6 06/20/2014   CL 101 06/20/2014   CREATININE 0.69* 06/20/2014   BUN 5* 06/20/2014   CO2 22 06/20/2014  TSH 1.260 06/20/2014   INR 1.3* 06/18/2013   HGBA1C 12.0% 08/24/2014    Patient was never admitted.  Assessment & Plan:   Mavrick was seen today for insomnia, increased thirst and urinary frequency.  Diagnoses and all orders for this visit:  Elevated blood sugar Orders: -     POCT glycosylated hemoglobin (Hb A1C); Standing -     POCT UA - Microalbumin -     CMP14+EGFR; Standing -     POCT glycosylated hemoglobin (Hb A1C) -     POCT glucose (manual entry) -     CMP14+EGFR -     Microalbumin, urine  Hyperlipemia Orders: -     Lipid panel; Standing -     NMR, lipoprofile -     Microalbumin, urine  Other orders -     blood glucose meter kit and supplies KIT; Dispense based on patient and insurance preference. Use up to four times  daily as directed. (FOR ICD-9 250.00, 250.01). -     metFORMIN (GLUCOPHAGE-XR) 750 MG 24 hr tablet; Take 2 tablets (1,500 mg total) by mouth daily with breakfast.   I am having Mr. Archuletta start on blood glucose meter kit and supplies and metFORMIN. I am also having him maintain his multivitamin, albuterol, lactulose, rifaximin, propranolol, CALCIUM-MAGNESIUM-ZINC PO, minocycline, FLUoxetine, pantoprazole, spironolactone, and zolpidem.  Meds ordered this encounter  Medications  . blood glucose meter kit and supplies KIT    Sig: Dispense based on patient and insurance preference. Use up to four times daily as directed. (FOR ICD-9 250.00, 250.01).    Dispense:  1 each    Refill:  0    Order Specific Question:  Number of strips    Answer:  125    Order Specific Question:  Number of lancets    Answer:  125  . metFORMIN (GLUCOPHAGE-XR) 750 MG 24 hr tablet    Sig: Take 2 tablets (1,500 mg total) by mouth daily with breakfast.    Dispense:  60 tablet    Refill:  5   Approximately 45 minutes was spent in this evaluation over half of which was in consultation regarding the diagnosis of diabetes monitoring blood sugar appropriate diet etc. Pt given handouts for the following topics: Carb counting for diabetes, hypoglycemia, diabetic foot care and diabetes and exercise.  A monitor was dispensed to the patient along with instruction and hands-on demonstration of proper blood sugar monitoring both fasting and postprandial. Follow-up: Return in about 1 month (around 09/23/2014) for diabetes.  Claretta Fraise, M.D.

## 2014-08-24 NOTE — Patient Instructions (Signed)
Hypoglycemia Hypoglycemia occurs when the glucose in your blood is too low. Glucose is a type of sugar that is your body's main energy source. Hormones, such as insulin and glucagon, control the level of glucose in the blood. Insulin lowers blood glucose and glucagon increases blood glucose. Having too much insulin in your blood stream, or not eating enough food containing sugar, can result in hypoglycemia. Hypoglycemia can happen to people with or without diabetes. It can develop quickly and can be a medical emergency.  CAUSES  Missing or delaying meals. Not eating enough carbohydrates at meals. Taking too much diabetes medicine. Not timing your oral diabetes medicine or insulin doses with meals, snacks, and exercise. Nausea and vomiting. Certain medicines. Severe illnesses, such as hepatitis, kidney disorders, and certain eating disorders. Increased activity or exercise without eating something extra or adjusting medicines. Drinking too much alcohol. A nerve disorder that affects body functions like your heart rate, blood pressure, and digestion (autonomic neuropathy). A condition where the stomach muscles do not function properly (gastroparesis). Therefore, medicines and food may not absorb properly. Rarely, a tumor of the pancreas can produce too much insulin. SYMPTOMS  Hunger. Sweating (diaphoresis). Change in body temperature. Shakiness. Headache. Anxiety. Lightheadedness. Irritability. Difficulty concentrating. Dry mouth. Tingling or numbness in the hands or feet. Restless sleep or sleep disturbances. Altered speech and coordination. Change in mental status. Seizures or prolonged convulsions. Combativeness. Drowsiness (lethargic). Weakness. Increased heart rate or palpitations. Confusion. Pale, gray skin color. Blurred or double vision. Fainting. DIAGNOSIS  A physical exam and medical history will be performed. Your caregiver may make a diagnosis based on your symptoms.  Blood tests and other lab tests may be performed to confirm a diagnosis. Once the diagnosis is made, your caregiver will see if your signs and symptoms go away once your blood glucose is raised.  TREATMENT  Usually, you can easily treat your hypoglycemia when you notice symptoms. Check your blood glucose. If it is less than 70 mg/dl, take one of the following:  3-4 glucose tablets.   cup juice.   cup regular soda.  1 cup skim milk.  -1 tube of glucose gel.  5-6 hard candies.  Avoid high-fat drinks or food that may delay a rise in blood glucose levels. Do not take more than the recommended amount of sugary foods, drinks, gel, or tablets. Doing so will cause your blood glucose to go too high.  Wait 10-15 minutes and recheck your blood glucose. If it is still less than 70 mg/dl or below your target range, repeat treatment.  Eat a snack if it is more than 1 hour until your next meal.  There may be a time when your blood glucose may go so low that you are unable to treat yourself at home when you start to notice symptoms. You may need someone to help you. You may even faint or be unable to swallow. If you cannot treat yourself, someone will need to bring you to the hospital.  HOME CARE INSTRUCTIONS If you have diabetes, follow your diabetes management plan by: Taking your medicines as directed. Following your exercise plan. Following your meal plan. Do not skip meals. Eat on time. Testing your blood glucose regularly. Check your blood glucose before and after exercise. If you exercise longer or different than usual, be sure to check blood glucose more frequently. Wearing your medical alert jewelry that says you have diabetes. Identify the cause of your hypoglycemia. Then, develop ways to prevent the recurrence of hypoglycemia.   Do not take a hot bath or shower right after an insulin shot. Always carry treatment with you. Glucose tablets are the easiest to carry. If you are going to  drink alcohol, drink it only with meals. Tell friends or family members ways to keep you safe during a seizure. This may include removing hard or sharp objects from the area or turning you on your side. Maintain a healthy weight. SEEK MEDICAL CARE IF:  You are having problems keeping your blood glucose in your target range. You are having frequent episodes of hypoglycemia. You feel you might be having side effects from your medicines. You are not sure why your blood glucose is dropping so low. You notice a change in vision or a new problem with your vision. SEEK IMMEDIATE MEDICAL CARE IF:  Confusion develops. A change in mental status occurs. The inability to swallow develops. Fainting occurs. Document Released: 05/01/2005 Document Revised: 05/06/2013 Document Reviewed: 08/28/2011 ExitCare Patient Information 2015 [employer]. This information is not intended to replace advice given to you by your health care provider. Make sure you discuss any questions you have with your health care provider. Diabetes and Foot Care Diabetes may cause you to have problems because of poor blood supply (circulation) to your feet and legs. This may cause the skin on your feet to become thinner, break easier, and heal more slowly. Your skin may become dry, and the skin may peel and crack. You may also have nerve damage in your legs and feet causing decreased feeling in them. You may not notice minor injuries to your feet that could lead to infections or more serious problems. Taking care of your feet is one of the most important things you can do for yourself.  HOME CARE INSTRUCTIONS  Wear shoes at all times, even in the house. Do not go barefoot. Bare feet are easily injured.  Check your feet daily for blisters, cuts, and redness. If you cannot see the bottom of your feet, use a mirror or ask someone for help.  Wash your feet with warm water (do not use hot water) and mild soap. Then pat your feet and the  areas between your toes until they are completely dry. Do not soak your feet as this can dry your skin.  Apply a moisturizing lotion or petroleum jelly (that does not contain alcohol and is unscented) to the skin on your feet and to dry, brittle toenails. Do not apply lotion between your toes.  Trim your toenails straight across. Do not dig under them or around the cuticle. File the edges of your nails with an emery board or nail file.  Do not cut corns or calluses or try to remove them with medicine.  Wear clean socks or stockings every day. Make sure they are not too tight. Do not wear knee-high stockings since they may decrease blood flow to your legs.  Wear shoes that fit properly and have enough cushioning. To break in new shoes, wear them for just a few hours a day. This prevents you from injuring your feet. Always look in your shoes before you put them on to be sure there are no objects inside.  Do not cross your legs. This may decrease the blood flow to your feet.  If you find a minor scrape, cut, or break in the skin on your feet, keep it and the skin around it clean and dry. These areas may be cleansed with mild soap and water. Do not cleanse the   area with peroxide, alcohol, or iodine.  When you remove an adhesive bandage, be sure not to damage the skin around it.  If you have a wound, look at it several times a day to make sure it is healing.  Do not use heating pads or hot water bottles. They may burn your skin. If you have lost feeling in your feet or legs, you may not know it is happening until it is too late.  Make sure your health care provider performs a complete foot exam at least annually or more often if you have foot problems. Report any cuts, sores, or bruises to your health care provider immediately. SEEK MEDICAL CARE IF:   You have an injury that is not healing.  You have cuts or breaks in the skin.  You have an ingrown nail.  You notice redness on your legs or  feet.  You feel burning or tingling in your legs or feet.  You have pain or cramps in your legs and feet.  Your legs or feet are numb.  Your feet always feel cold. SEEK IMMEDIATE MEDICAL CARE IF:   There is increasing redness, swelling, or pain in or around a wound.  There is a red line that goes up your leg.  Pus is coming from a wound.  You develop a fever or as directed by your health care provider.  You notice a bad smell coming from an ulcer or wound. Document Released: 04/28/2000 Document Revised: 01/01/2013 Document Reviewed: 10/08/2012 ExitCare Patient Information 2015 [employer]. This information is not intended to replace advice given to you by your health care provider. Make sure you discuss any questions you have with your health care provider. Diabetes and Exercise Exercising regularly is important. It is not just about losing weight. It has many health benefits, such as:  Improving your overall fitness, flexibility, and endurance.  Increasing your bone density.  Helping with weight control.  Decreasing your body fat.  Increasing your muscle strength.  Reducing stress and tension.  Improving your overall health. People with diabetes who exercise gain additional benefits because exercise:  Reduces appetite.  Improves the body's use of blood sugar (glucose).  Helps lower or control blood glucose.  Decreases blood pressure.  Helps control blood lipids (such as cholesterol and triglycerides).  Improves the body's use of the hormone insulin by:  Increasing the body's insulin sensitivity.  Reducing the body's insulin needs.  Decreases the risk for heart disease because exercising:  Lowers cholesterol and triglycerides levels.  Increases the levels of good cholesterol (such as high-density lipoproteins [HDL]) in the body.  Lowers blood glucose levels. YOUR ACTIVITY PLAN  Choose an activity that you enjoy and set realistic goals. Your health  care provider or diabetes educator can help you make an activity plan that works for you. Exercise regularly as directed by your health care provider. This includes:  Performing resistance training twice a week such as push-ups, sit-ups, lifting weights, or using resistance bands.  Performing 150 minutes of cardio exercises each week such as walking, running, or playing sports.  Staying active and spending no more than 90 minutes at one time being inactive. Even short bursts of exercise are good for you. Three 10-minute sessions spread throughout the day are just as beneficial as a single 30-minute session. Some exercise ideas include:  Taking the dog for a walk.  Taking the stairs instead of the elevator.  Dancing to your favorite song.  Doing an exercise video.    Doing your favorite exercise with a friend. RECOMMENDATIONS FOR EXERCISING WITH TYPE 1 OR TYPE 2 DIABETES   Check your blood glucose before exercising. If blood glucose levels are greater than 240 mg/dL, check for urine ketones. Do not exercise if ketones are present.  Avoid injecting insulin into areas of the body that are going to be exercised. For example, avoid injecting insulin into:  The arms when playing tennis.  The legs when jogging.  Keep a record of:  Food intake before and after you exercise.  Expected peak times of insulin action.  Blood glucose levels before and after you exercise.  The type and amount of exercise you have done.  Review your records with your health care provider. Your health care provider will help you to develop guidelines for adjusting food intake and insulin amounts before and after exercising.  If you take insulin or oral hypoglycemic agents, watch for signs and symptoms of hypoglycemia. They include:  Dizziness.  Shaking.  Sweating.  Chills.  Confusion.  Drink plenty of water while you exercise to prevent dehydration or heat stroke. Body water is lost during exercise  and must be replaced.  Talk to your health care provider before starting an exercise program to make sure it is safe for you. Remember, almost any type of activity is better than none. Document Released: 07/22/2003 Document Revised: 09/15/2013 Document Reviewed: 10/08/2012 ExitCare Patient Information 2015 [employer]. This information is not intended to replace advice given to you by your health care provider. Make sure you discuss any questions you have with your health care provider. Basic Carbohydrate Counting for Diabetes Mellitus Carbohydrate counting is a method for keeping track of the amount of carbohydrates you eat. Eating carbohydrates naturally increases the level of sugar (glucose) in your blood, so it is important for you to know the amount that is okay for you to have in every meal. Carbohydrate counting helps keep the level of glucose in your blood within normal limits. The amount of carbohydrates allowed is different for every person. A dietitian can help you calculate the amount that is right for you. Once you know the amount of carbohydrates you can have, you can count the carbohydrates in the foods you want to eat. Carbohydrates are found in the following foods:  Grains, such as breads and cereals.  Dried beans and soy products.  Starchy vegetables, such as potatoes, peas, and corn.  Fruit and fruit juices.  Milk and yogurt.  Sweets and snack foods, such as cake, cookies, candy, chips, soft drinks, and fruit drinks. CARBOHYDRATE COUNTING There are two ways to count the carbohydrates in your food. You can use either of the methods or a combination of both. Reading the "Nutrition Facts" on Packaged Food The "Nutrition Facts" is an area that is included on the labels of almost all packaged food and beverages in the [place]. It includes the serving size of that food or beverage and information about the nutrients in each serving of the food, including the grams (g) of  carbohydrate per serving.  Decide the number of servings of this food or beverage that you will be able to eat or drink. Multiply that number of servings by the number of grams of carbohydrate that is listed on the label for that serving. The total will be the amount of carbohydrates you will be having when you eat or drink this food or beverage. Learning Standard Serving Sizes of Food When you eat food that is not packaged   or does not include "Nutrition Facts" on the label, you need to measure the servings in order to count the amount of carbohydrates.A serving of most carbohydrate-rich foods contains about 15 g of carbohydrates. The following list includes serving sizes of carbohydrate-rich foods that provide 15 g ofcarbohydrate per serving:   1 slice of bread (1 oz) or 1 six-inch tortilla.    of a hamburger bun or English muffin.  4-6 crackers.   cup unsweetened dry cereal.    cup hot cereal.   cup rice or pasta.    cup mashed potatoes or  of a large baked potato.  1 cup fresh fruit or one small piece of fruit.    cup canned or frozen fruit or fruit juice.  1 cup milk.   cup plain fat-free yogurt or yogurt sweetened with artificial sweeteners.   cup cooked dried beans or starchy vegetable, such as peas, corn, or potatoes.  Decide the number of standard-size servings that you will eat. Multiply that number of servings by 15 (the grams of carbohydrates in that serving). For example, if you eat 2 cups of strawberries, you will have eaten 2 servings and 30 g of carbohydrates (2 servings x 15 g = 30 g). For foods such as soups and casseroles, in which more than one food is mixed in, you will need to count the carbohydrates in each food that is included. EXAMPLE OF CARBOHYDRATE COUNTING Sample Dinner  3 oz chicken breast.   cup of brown rice.   cup of corn.  1 cup milk.   1 cup strawberries with sugar-free whipped topping.  Carbohydrate Calculation Step 1:  Identify the foods that contain carbohydrates:   Rice.   Corn.   Milk.   Strawberries. Step 2:Calculate the number of servings eaten of each:   2 servings of rice.   1 serving of corn.   1 serving of milk.   1 serving of strawberries. Step 3: Multiply each of those number of servings by 15 g:   2 servings of rice x 15 g = 30 g.   1 serving of corn x 15 g = 15 g.   1 serving of milk x 15 g = 15 g.   1 serving of strawberries x 15 g = 15 g. Step 4: Add together all of the amounts to find the total grams of carbohydrates eaten: 30 g + 15 g + 15 g + 15 g = 75 g. Document Released: 05/01/2005 Document Revised: 09/15/2013 Document Reviewed: 03/28/2013 ExitCare Patient Information 2015 [employer]. This information is not intended to replace advice given to you by your health care provider. Make sure you discuss any questions you have with your health care provider.  

## 2014-08-25 ENCOUNTER — Other Ambulatory Visit: Payer: Self-pay | Admitting: Family Medicine

## 2014-08-25 ENCOUNTER — Telehealth: Payer: Self-pay | Admitting: Family Medicine

## 2014-08-25 LAB — NMR, LIPOPROFILE
Cholesterol: 164 mg/dL (ref 100–199)
HDL CHOLESTEROL BY NMR: 25 mg/dL — AB (ref >39)
HDL PARTICLE NUMBER: 17.5 umol/L — AB (ref >=30.5)
LDL Particle Number: 1877 nmol/L — ABNORMAL HIGH (ref <1000)
LDL Size: 19.6 nm (ref >20.5)
LDL-C: 95 mg/dL (ref 0–99)
LP-IR SCORE: 67 — AB (ref <=45)
SMALL LDL PARTICLE NUMBER: 1651 nmol/L — AB (ref <=527)
Triglycerides by NMR: 222 mg/dL — ABNORMAL HIGH (ref 0–149)

## 2014-08-25 LAB — CMP14+EGFR
ALT: 23 IU/L (ref 0–44)
AST: 30 IU/L (ref 0–40)
Albumin/Globulin Ratio: 1.2 (ref 1.1–2.5)
Albumin: 3.8 g/dL (ref 3.5–5.5)
Alkaline Phosphatase: 176 IU/L — ABNORMAL HIGH (ref 39–117)
BUN/Creatinine Ratio: 11 (ref 9–20)
BUN: 10 mg/dL (ref 6–24)
Bilirubin Total: 1.4 mg/dL — ABNORMAL HIGH (ref 0.0–1.2)
CO2: 24 mmol/L (ref 18–29)
CREATININE: 0.91 mg/dL (ref 0.76–1.27)
Calcium: 9 mg/dL (ref 8.7–10.2)
Chloride: 94 mmol/L — ABNORMAL LOW (ref 97–108)
GFR calc Af Amer: 110 mL/min/{1.73_m2} (ref >59)
GFR calc non Af Amer: 95 mL/min/{1.73_m2} (ref >59)
GLOBULIN, TOTAL: 3.1 g/dL (ref 1.5–4.5)
Glucose: 349 mg/dL — ABNORMAL HIGH (ref 65–99)
POTASSIUM: 4.6 mmol/L (ref 3.5–5.2)
Sodium: 134 mmol/L (ref 134–144)
Total Protein: 6.9 g/dL (ref 6.0–8.5)

## 2014-08-25 LAB — MICROALBUMIN, URINE: Microalbumin, Urine: 43.7 ug/mL — ABNORMAL HIGH (ref 0.0–17.0)

## 2014-08-25 MED ORDER — ATORVASTATIN CALCIUM 40 MG PO TABS: 40.0000 mg | ORAL_TABLET | Freq: Every day | ORAL | Status: DC

## 2014-08-25 MED ORDER — LISINOPRIL 20 MG PO TABS: 20.0000 mg | ORAL_TABLET | Freq: Every day | ORAL | Status: DC

## 2014-08-26 NOTE — Telephone Encounter (Signed)
Labs faxed electronically today 08/26/14 per patient's request.

## 2014-08-28 ENCOUNTER — Other Ambulatory Visit: Payer: Self-pay | Admitting: *Deleted

## 2014-08-28 MED ORDER — ATORVASTATIN CALCIUM 40 MG PO TABS: 40.0000 mg | ORAL_TABLET | Freq: Every day | ORAL | Status: DC

## 2014-08-28 MED ORDER — LISINOPRIL 20 MG PO TABS: 20.0000 mg | ORAL_TABLET | Freq: Every day | ORAL | Status: DC

## 2014-08-28 NOTE — Telephone Encounter (Signed)
Patient needed his lipitor and atorvastatin switched to mail order. These rx's have been sent to [facility].

## 2014-09-01 ENCOUNTER — Telehealth: Payer: Self-pay | Admitting: Family Medicine

## 2014-09-02 NOTE — Telephone Encounter (Signed)
Samples given and order cleared

## 2014-09-23 ENCOUNTER — Ambulatory Visit (INDEPENDENT_AMBULATORY_CARE_PROVIDER_SITE_OTHER): Payer: Commercial Managed Care - HMO | Admitting: Family Medicine

## 2014-09-23 ENCOUNTER — Encounter: Payer: Self-pay | Admitting: Family Medicine

## 2014-09-23 VITALS — BP 97/55 | HR 81 | Temp 97.1°F | Ht 69.0 in | Wt 185.0 lb

## 2014-09-23 DIAGNOSIS — E1165 Type 2 diabetes mellitus with hyperglycemia: Secondary | ICD-10-CM

## 2014-09-23 DIAGNOSIS — K7030 Alcoholic cirrhosis of liver without ascites: Secondary | ICD-10-CM | POA: Diagnosis not present

## 2014-09-23 DIAGNOSIS — E785 Hyperlipidemia, unspecified: Secondary | ICD-10-CM

## 2014-09-23 DIAGNOSIS — IMO0001 Reserved for inherently not codable concepts without codable children: Secondary | ICD-10-CM

## 2014-09-23 MED ORDER — METFORMIN HCL ER 750 MG PO TB24: 1500.0000 mg | ORAL_TABLET | Freq: Every day | ORAL | Status: DC

## 2014-09-23 NOTE — Progress Notes (Signed)
Subjective:  Patient ID: Spencer Stanley, male    DOB: 1961/05/11  Age: 54 y.o. MRN: [id number]  CC: Diabetes   HPI Jerrod Damiano presents for follow-up of elevated cholesterol. Doing well without complaints on current medication. Denies side effects of statin including myalgia and arthralgia and nausea. Also in today for liver function testing. Currently no chest pain, shortness of breath or other cardiovascular related symptoms noted.  Follow-up of diabetes. Patient does check blood sugar at home. Log reviewed showing that the sugar has dropped from averaging in the mid 200s to 300 at times to in the last 2 weeks it has been in the 100-125 range most days fasting and in the 130 to 150 range most days postprandial. See attached log sheet. Patient denies symptoms such as polyuria, polydipsia, excessive hunger, nausea No significant hypoglycemic spells noted. Medications as noted below. Taking them regularly without complication/adverse reaction being reported today.   Patient has been able to get his Xifaxan. His co-pay is $500 a month. He is working on a plan with the company that makes it to help provided but in the meantime he continues to have significant diarrhea. He is seeing a GI specialist. They are monitoring his liver functions. He is asking today that we do the blood work and send it to GI at [place] has a past medical history of Esophageal varices; Cirrhosis; Anxiety; Lyme disease; and [facility] spotted fever.   He has past surgical history that includes Hernia repair and Esophageal varice ligation.   His family history includes Aneurysm in his mother; Cancer in his father and mother; Dementia in his father; Diabetes in his sister; Heart failure in his father.He reports that he has been smoking.  He does not have any smokeless tobacco history on file. He reports that he does not drink alcohol or use illicit drugs.  Current Outpatient Prescriptions on File Prior to Visit   Medication Sig Dispense Refill  . albuterol (PROVENTIL HFA;VENTOLIN HFA) 108 (90 BASE) MCG/ACT inhaler Inhale 2 puffs into the lungs every 6 (six) hours as needed for wheezing or shortness of breath.    Marland Kitchen atorvastatin (LIPITOR) 40 MG tablet Take 1 tablet (40 mg total) by mouth daily. For cholesterol 90 tablet 3  . blood glucose meter kit and supplies KIT Dispense based on patient and insurance preference. Use up to four times daily as directed. (FOR ICD-9 250.00, 250.01). 1 each 0  . CALCIUM-MAGNESIUM-ZINC PO Take 1 capsule by mouth 2 (two) times daily.    Marland Kitchen FLUoxetine (PROZAC) 40 MG capsule Take 1 capsule (40 mg total) by mouth daily. 90 capsule 1  . lactulose (CHRONULAC) 10 GM/15ML solution Take 45 mLs (30 g total) by mouth 3 (three) times daily. 240 mL 11  . lisinopril (PRINIVIL,ZESTRIL) 20 MG tablet Take 1 tablet (20 mg total) by mouth daily. To protect kidneys from potential diabetes damage. 90 tablet 3  . minocycline (MINOCIN) 100 MG capsule Take 1 capsule (100 mg total) by mouth 2 (two) times daily. 60 capsule 5  . multivitamin (ONE-A-DAY MEN'S) TABS tablet Take 1 tablet by mouth daily.    . pantoprazole (PROTONIX) 40 MG tablet Take 1 tablet (40 mg total) by mouth daily. 90 tablet 1  . rifaximin (XIFAXAN) 550 MG TABS tablet Take 1 tablet (550 mg total) by mouth 2 (two) times daily. 60 tablet 5  . spironolactone (ALDACTONE) 25 MG tablet Take 1 tablet (25 mg total) by mouth 2 (two) times daily. 180 tablet  1  . zolpidem (AMBIEN) 10 MG tablet Take 1 tablet (10 mg total) by mouth at bedtime as needed for sleep. 90 tablet 1   No current facility-administered medications on file prior to visit.    ROS Review of Systems  Constitutional: Negative for fever, chills and diaphoresis.  HENT: Negative for congestion, rhinorrhea and sore throat.   Respiratory: Negative for cough, shortness of breath and wheezing.   Cardiovascular: Negative for chest pain.  Gastrointestinal: Positive for nausea,  abdominal pain and diarrhea. Negative for vomiting, constipation and abdominal distention.  Genitourinary: Negative for dysuria and frequency.  Musculoskeletal: Negative for joint swelling and arthralgias.  Skin: Negative for rash.  Neurological: Negative for headaches.    Objective:  BP 97/55 mmHg  Pulse 81  Temp(Src) 97.1 F (36.2 C) (Oral)  Ht _0  (1.753 m)  Wt 185 lb (83.915 kg)  BMI 27.31 kg/m2  BP Readings from Last 3 Encounters:  09/23/14 97/55  08/24/14 121/76  06/19/14 108/65    Wt Readings from Last 3 Encounters:  09/23/14 185 lb (83.915 kg)  08/24/14 190 lb (86.183 kg)  06/19/14 193 lb (87.544 kg)     Physical Exam  Constitutional: He is oriented to person, place, and time. He appears well-developed and well-nourished. No distress.  HENT:  Head: Normocephalic and atraumatic.  Right Ear: External ear normal.  Left Ear: External ear normal.  Nose: Nose normal.  Mouth/Throat: Oropharynx is clear and moist.  Eyes: Conjunctivae and EOM are normal. Pupils are equal, round, and reactive to light.  Neck: Normal range of motion. Neck supple. No thyromegaly present.  Cardiovascular: Normal rate, regular rhythm and normal heart sounds.   No murmur heard. Pulmonary/Chest: Effort normal and breath sounds normal. No respiratory distress. He has no wheezes. He has no rales.  Abdominal: Soft. Bowel sounds are normal. He exhibits distension. He exhibits no mass. There is tenderness. There is no rebound and no guarding.  Lymphadenopathy:    He has no cervical adenopathy.  Neurological: He is alert and oriented to person, place, and time. He has normal reflexes.  Skin: Skin is warm and dry.  Psychiatric: He has a normal mood and affect. His behavior is normal. Judgment and thought content normal.    Lab Results  Component Value Date   HGBA1C 12.0% 08/24/2014    Lab Results  Component Value Date   WBC 5.0 12/12/2013   HGB 12.6* 12/12/2013   HCT 40.5* 12/12/2013    PLT 91* 09/12/2013   GLUCOSE 349* 08/24/2014   CHOL 164 08/24/2014   TRIG 222* 08/24/2014   HDL 25* 08/24/2014   ALT 23 08/24/2014   AST 30 08/24/2014   NA 134 08/24/2014   K 4.6 08/24/2014   CL 94* 08/24/2014   CREATININE 0.91 08/24/2014   BUN 10 08/24/2014   CO2 24 08/24/2014   TSH 1.260 06/20/2014   INR 1.3* 06/18/2013   HGBA1C 12.0% 08/24/2014    Patient was never admitted.  Assessment & Plan:   Danial was seen today for diabetes.  Diagnoses and all orders for this visit:  Hyperlipemia Orders: -     Lipid panel -     IPJ82+NKNL  Alcoholic cirrhosis of liver without ascites Orders: -     CMP14+EGFR -     Ammonia  Diabetes mellitus type 2, uncontrolled, without complications Orders: -     CMP14+EGFR  Other orders -     metFORMIN (GLUCOPHAGE-XR) 750 MG 24 hr tablet; Take 2 tablets (1,500  mg total) by mouth daily with breakfast.   I have discontinued Mr. Dorko propranolol. I am also having him maintain his multivitamin, albuterol, lactulose, rifaximin, CALCIUM-MAGNESIUM-ZINC PO, minocycline, FLUoxetine, pantoprazole, spironolactone, zolpidem, blood glucose meter kit and supplies, lisinopril, atorvastatin, and metFORMIN.  Meds ordered this encounter  Medications  . metFORMIN (GLUCOPHAGE-XR) 750 MG 24 hr tablet    Sig: Take 2 tablets (1,500 mg total) by mouth daily with breakfast.    Dispense:  180 tablet    Refill:  3     Follow-up: Return in about 3 months (around 12/24/2014).  Claretta Fraise, M.D.

## 2014-09-24 LAB — CMP14+EGFR
A/G RATIO: 1.4 (ref 1.1–2.5)
ALBUMIN: 3.8 g/dL (ref 3.5–5.5)
ALT: 23 IU/L (ref 0–44)
AST: 35 IU/L (ref 0–40)
Alkaline Phosphatase: 142 IU/L — ABNORMAL HIGH (ref 39–117)
BUN / CREAT RATIO: 14 (ref 9–20)
BUN: 11 mg/dL (ref 6–24)
Bilirubin Total: 1.2 mg/dL (ref 0.0–1.2)
CALCIUM: 9.5 mg/dL (ref 8.7–10.2)
CHLORIDE: 103 mmol/L (ref 97–108)
CO2: 22 mmol/L (ref 18–29)
Creatinine, Ser: 0.78 mg/dL (ref 0.76–1.27)
GFR, EST AFRICAN AMERICAN: 118 mL/min/{1.73_m2} (ref >59)
GFR, EST NON AFRICAN AMERICAN: 102 mL/min/{1.73_m2} (ref >59)
GLOBULIN, TOTAL: 2.8 g/dL (ref 1.5–4.5)
Glucose: 112 mg/dL — ABNORMAL HIGH (ref 65–99)
POTASSIUM: 4.6 mmol/L (ref 3.5–5.2)
SODIUM: 140 mmol/L (ref 134–144)
TOTAL PROTEIN: 6.6 g/dL (ref 6.0–8.5)

## 2014-09-24 LAB — LIPID PANEL
CHOLESTEROL TOTAL: 91 mg/dL — AB (ref 100–199)
Chol/HDL Ratio: 2.4 ratio units (ref 0.0–5.0)
HDL: 38 mg/dL — AB (ref >39)
LDL CALC: 35 mg/dL (ref 0–99)
TRIGLYCERIDES: 91 mg/dL (ref 0–149)
VLDL Cholesterol Cal: 18 mg/dL (ref 5–40)

## 2014-09-24 LAB — AMMONIA: Ammonia: 148 ug/dL (ref 27–102)

## 2014-10-06 DIAGNOSIS — Z79899 Other long term (current) drug therapy: Secondary | ICD-10-CM | POA: Diagnosis not present

## 2014-10-06 DIAGNOSIS — K7460 Unspecified cirrhosis of liver: Secondary | ICD-10-CM | POA: Diagnosis not present

## 2014-10-06 DIAGNOSIS — R600 Localized edema: Secondary | ICD-10-CM | POA: Diagnosis not present

## 2014-10-09 ENCOUNTER — Other Ambulatory Visit: Payer: Self-pay | Admitting: Family Medicine

## 2014-10-09 DIAGNOSIS — E119 Type 2 diabetes mellitus without complications: Secondary | ICD-10-CM

## 2014-10-09 MED ORDER — GLUCOSE BLOOD VI STRP: ORAL_STRIP | Status: DC

## 2014-10-09 NOTE — Telephone Encounter (Signed)
Rx sent in to pharmacy. 

## 2014-12-14 ENCOUNTER — Telehealth: Payer: Self-pay | Admitting: Family Medicine

## 2014-12-14 ENCOUNTER — Other Ambulatory Visit: Payer: Self-pay | Admitting: Family Medicine

## 2014-12-15 MED ORDER — ATORVASTATIN CALCIUM 40 MG PO TABS
40.0000 mg | ORAL_TABLET | Freq: Every day | ORAL | Status: DC
Start: 2014-12-15 — End: 2015-03-26

## 2014-12-15 MED ORDER — LISINOPRIL 20 MG PO TABS: 20.0000 mg | ORAL_TABLET | Freq: Every day | ORAL | Status: DC

## 2014-12-15 NOTE — Telephone Encounter (Signed)
done

## 2014-12-22 ENCOUNTER — Ambulatory Visit (INDEPENDENT_AMBULATORY_CARE_PROVIDER_SITE_OTHER): Payer: Commercial Managed Care - HMO | Admitting: Family Medicine

## 2014-12-22 ENCOUNTER — Encounter: Payer: Self-pay | Admitting: Family Medicine

## 2014-12-22 VITALS — BP 83/54 | HR 75 | Temp 98.8°F | Ht 69.0 in | Wt 182.0 lb

## 2014-12-22 DIAGNOSIS — R7309 Other abnormal glucose: Secondary | ICD-10-CM | POA: Diagnosis not present

## 2014-12-22 DIAGNOSIS — R739 Hyperglycemia, unspecified: Secondary | ICD-10-CM | POA: Insufficient documentation

## 2014-12-22 DIAGNOSIS — I952 Hypotension due to drugs: Secondary | ICD-10-CM

## 2014-12-22 DIAGNOSIS — E785 Hyperlipidemia, unspecified: Secondary | ICD-10-CM | POA: Insufficient documentation

## 2014-12-22 DIAGNOSIS — E119 Type 2 diabetes mellitus without complications: Secondary | ICD-10-CM

## 2014-12-22 DIAGNOSIS — I951 Orthostatic hypotension: Secondary | ICD-10-CM

## 2014-12-22 DIAGNOSIS — K7030 Alcoholic cirrhosis of liver without ascites: Secondary | ICD-10-CM

## 2014-12-22 LAB — POCT GLYCOSYLATED HEMOGLOBIN (HGB A1C): Hemoglobin A1C: 5.7

## 2014-12-22 MED ORDER — PANTOPRAZOLE SODIUM 40 MG PO TBEC: 40.0000 mg | DELAYED_RELEASE_TABLET | Freq: Every day | ORAL | Status: DC

## 2014-12-22 MED ORDER — FLUOXETINE HCL 40 MG PO CAPS: 40.0000 mg | ORAL_CAPSULE | Freq: Every day | ORAL | Status: DC

## 2014-12-22 MED ORDER — SPIRONOLACTONE 25 MG PO TABS: 25.0000 mg | ORAL_TABLET | Freq: Two times a day (BID) | ORAL | Status: DC

## 2014-12-22 MED ORDER — MINOCYCLINE HCL 100 MG PO CAPS: 100.0000 mg | ORAL_CAPSULE | Freq: Two times a day (BID) | ORAL | Status: DC

## 2014-12-22 MED ORDER — ZOLPIDEM TARTRATE 10 MG PO TABS: 10.0000 mg | ORAL_TABLET | Freq: Every evening | ORAL | Status: DC | PRN

## 2014-12-22 NOTE — Progress Notes (Signed)
Subjective:  Patient ID: Spencer Stanley, male    DOB: 02-Mar-1961  Age: 54 y.o. MRN: [id number]  CC: Diabetes; Hyperlipidemia; Gastrophageal Reflux; and Insomnia   HPI Spencer Stanley presents forFollow-up of diabetes. Patient does  check blood sugar at home Patient denies symptoms such as polyuria, polydipsia, excessive hunger, nausea No significant hypoglycemic spells noted. Medications as noted below. Taking them regularly without complication/adverse reaction being reported today.  Patient has had a recentbout of what his wife describes as a combination of dementia and psychosisthatover the years she has discovered is because his ammonia level goes up. She states that it is not being checked by the liver specialist at [facility]. She would like to have that checked She had increased his lactulose and there has been a significant improvement Today he isback to normal.He does state that the source of his cirrhosis is alcoholism However he quit drinking at diagnosis 4 years ago. His chronic diarrhea has been significantly improved with using the Xifaxan.  Patient in for follow-up of elevated cholesterol. Doing well without complaints on current medication. Denies side effects of statin including myalgia and arthralgia and nausea. Also in today for liver function testing. Currently no chest pain, shortness of breath or other cardiovascular related symptoms noted.  History Spencer Stanley has a past medical history of Esophageal varices; Cirrhosis; Anxiety; Lyme disease; and [facility] spotted fever.   He has past surgical history that includes Hernia repair and Esophageal varice ligation.   His family history includes Aneurysm in his mother; Cancer in his father and mother; Dementia in his father; Diabetes in his sister; Heart failure in his father.He reports that he has been smoking.  He does not have any smokeless tobacco history on file. He reports that he does not drink alcohol or use illicit  drugs.  Current Outpatient Prescriptions on File Prior to Visit  Medication Sig Dispense Refill  . atorvastatin (LIPITOR) 40 MG tablet Take 1 tablet (40 mg total) by mouth daily. For cholesterol 90 tablet 1  . CALCIUM-MAGNESIUM-ZINC PO Take 1 capsule by mouth 2 (two) times daily.    Marland Kitchen lactulose (CHRONULAC) 10 GM/15ML solution Take 45 mLs (30 g total) by mouth 3 (three) times daily. 240 mL 11  . lisinopril (PRINIVIL,ZESTRIL) 20 MG tablet Take 1 tablet (20 mg total) by mouth daily. To protect kidneys from potential diabetes damage. 90 tablet 1  . metFORMIN (GLUCOPHAGE-XR) 750 MG 24 hr tablet Take 2 tablets (1,500 mg total) by mouth daily with breakfast. 180 tablet 3  . multivitamin (ONE-A-DAY MEN'S) TABS tablet Take 1 tablet by mouth daily.    . rifaximin (XIFAXAN) 550 MG TABS tablet Take 1 tablet (550 mg total) by mouth 2 (two) times daily. 60 tablet 5  . albuterol (PROVENTIL HFA;VENTOLIN HFA) 108 (90 BASE) MCG/ACT inhaler Inhale 2 puffs into the lungs every 6 (six) hours as needed for wheezing or shortness of breath.     No current facility-administered medications on file prior to visit.    ROS Review of Systems  Constitutional: Negative for fever, chills and diaphoresis.  HENT: Negative for congestion, rhinorrhea and sore throat.   Respiratory: Negative for cough, shortness of breath and wheezing.   Cardiovascular: Negative for chest pain.  Gastrointestinal: Negative for nausea, vomiting, abdominal pain, diarrhea, constipation and abdominal distention.  Genitourinary: Negative for dysuria and frequency.  Musculoskeletal: Negative for joint swelling and arthralgias.  Skin: Negative for rash.  Neurological: Positive for dizziness (Describes orthostasis.Goes dark when he bends over or stands  up). Negative for seizures, numbness and headaches.  Hematological: Does not bruise/bleed easily.  Psychiatric/Behavioral:       See history o present illness    Objective:  BP 83/54 mmHg  Pulse  75  Temp(Src) 98.8 F (37.1 C) (Oral)  Ht '5\' 9"'  (1.753 m)  Wt 182 lb (82.555 kg)  BMI 26.86 kg/m2  BP Readings from Last 3 Encounters:  12/22/14 83/54  09/23/14 97/55  08/24/14 121/76    Wt Readings from Last 3 Encounters:  12/22/14 182 lb (82.555 kg)  09/23/14 185 lb (83.915 kg)  08/24/14 190 lb (86.183 kg)     Physical Exam  Constitutional: He is oriented to person, place, and time. He appears well-developed and well-nourished. No distress.  HENT:  Head: Normocephalic and atraumatic.  Right Ear: External ear normal.  Left Ear: External ear normal.  Nose: Nose normal.  Mouth/Throat: Oropharynx is clear and moist.  Eyes: Conjunctivae and EOM are normal. Pupils are equal, round, and reactive to light.  Neck: Normal range of motion. Neck supple. No thyromegaly present.  Cardiovascular: Normal rate, regular rhythm and normal heart sounds.   No murmur heard. Pulmonary/Chest: Effort normal and breath sounds normal. No respiratory distress. He has no wheezes. He has no rales.  Abdominal: Soft. Bowel sounds are normal. He exhibits no distension. There is no tenderness.  Lymphadenopathy:    He has no cervical adenopathy.  Neurological: He is alert and oriented to person, place, and time. He has normal reflexes.  Skin: Skin is warm and dry.  Psychiatric: He has a normal mood and affect. His behavior is normal. Judgment and thought content normal.    Lab Results  Component Value Date   HGBA1C 5.7 12/22/2014   HGBA1C 12.0% 08/24/2014    Lab Results  Component Value Date   WBC 5.0 12/12/2013   HGB 12.6* 12/12/2013   HCT 40.5* 12/12/2013   PLT 91* 09/12/2013   GLUCOSE 111* 12/22/2014   CHOL 146 12/22/2014   TRIG 151* 12/22/2014   HDL 38* 12/22/2014   LDLCALC 78 12/22/2014   ALT 25 12/22/2014   AST 32 12/22/2014   NA 141 12/22/2014   K 4.5 12/22/2014   CL 106 12/22/2014   CREATININE 0.72* 12/22/2014   BUN 7 12/22/2014   CO2 25 12/22/2014   TSH 0.760 12/22/2014    INR 1.3* 06/18/2013   HGBA1C 5.7 12/22/2014     Assessment & Plan:   Spencer Stanley was seen today for diabetes, hyperlipidemia, gastrophageal reflux and insomnia.  Diagnoses and all orders for this visit:  Alcoholic cirrhosis of liver without ascites -     Ammonia -     Ammonia; Future -     TSH  Elevated blood sugar -     POCT glycosylated hemoglobin (Hb A1C) -     CMP14+EGFR -     TSH  Hyperlipemia -     Lipid panel -     TSH  Type 2 diabetes mellitus without complication -     TSH  Other orders -     FLUoxetine (PROZAC) 40 MG capsule; Take 1 capsule (40 mg total) by mouth daily. -     pantoprazole (PROTONIX) 40 MG tablet; Take 1 tablet (40 mg total) by mouth daily. -     spironolactone (ALDACTONE) 25 MG tablet; Take 1 tablet (25 mg total) by mouth 2 (two) times daily. -     minocycline (MINOCIN) 100 MG capsule; Take 1 capsule (100 mg total) by mouth  2 (two) times daily. -     zolpidem (AMBIEN) 10 MG tablet; Take 1 tablet (10 mg total) by mouth at bedtime as needed for sleep.   I have discontinued Spencer Stanley blood glucose meter kit and supplies and glucose blood. I have also changed his FLUoxetine and pantoprazole. Additionally, I am having him maintain his multivitamin, albuterol, lactulose, rifaximin, CALCIUM-MAGNESIUM-ZINC PO, metFORMIN, atorvastatin, lisinopril, spironolactone, minocycline, and zolpidem.  Meds ordered this encounter  Medications  . FLUoxetine (PROZAC) 40 MG capsule    Sig: Take 1 capsule (40 mg total) by mouth daily.    Dispense:  90 capsule    Refill:  1  . pantoprazole (PROTONIX) 40 MG tablet    Sig: Take 1 tablet (40 mg total) by mouth daily.    Dispense:  90 tablet    Refill:  3  . spironolactone (ALDACTONE) 25 MG tablet    Sig: Take 1 tablet (25 mg total) by mouth 2 (two) times daily.    Dispense:  180 tablet    Refill:  1  . minocycline (MINOCIN) 100 MG capsule    Sig: Take 1 capsule (100 mg total) by mouth 2 (two) times daily.    Dispense:   180 capsule    Refill:  1  . zolpidem (AMBIEN) 10 MG tablet    Sig: Take 1 tablet (10 mg total) by mouth at bedtime as needed for sleep.    Dispense:  90 tablet    Refill:  1   Due to orthostasis consider cutting back on fluid medicines. However consultation with his wife and the patient indicates that his liver specialists have determined his pressure should stay 219 systolic to avoid ruptured varices. The hypotension is tolerable for him at this time. He was reminded to get up slowly and have a support and if he has a syncopal episode he should return for reconsideration.  Follow-up: Return in about 3 months (around 03/24/2015).  Claretta Fraise, M.D.

## 2014-12-23 LAB — CMP14+EGFR
A/G RATIO: 1.4 (ref 1.1–2.5)
ALBUMIN: 3.6 g/dL (ref 3.5–5.5)
ALK PHOS: 125 IU/L — AB (ref 39–117)
ALT: 25 IU/L (ref 0–44)
AST: 32 IU/L (ref 0–40)
BILIRUBIN TOTAL: 0.9 mg/dL (ref 0.0–1.2)
BUN / CREAT RATIO: 10 (ref 9–20)
BUN: 7 mg/dL (ref 6–24)
CO2: 25 mmol/L (ref 18–29)
Calcium: 8.7 mg/dL (ref 8.7–10.2)
Chloride: 106 mmol/L (ref 97–108)
Creatinine, Ser: 0.72 mg/dL — ABNORMAL LOW (ref 0.76–1.27)
GFR calc non Af Amer: 105 mL/min/{1.73_m2} (ref >59)
GFR, EST AFRICAN AMERICAN: 121 mL/min/{1.73_m2} (ref >59)
GLOBULIN, TOTAL: 2.5 g/dL (ref 1.5–4.5)
Glucose: 111 mg/dL — ABNORMAL HIGH (ref 65–99)
Potassium: 4.5 mmol/L (ref 3.5–5.2)
Sodium: 141 mmol/L (ref 134–144)
Total Protein: 6.1 g/dL (ref 6.0–8.5)

## 2014-12-23 LAB — LIPID PANEL
CHOLESTEROL TOTAL: 146 mg/dL (ref 100–199)
Chol/HDL Ratio: 3.8 ratio units (ref 0.0–5.0)
HDL: 38 mg/dL — ABNORMAL LOW (ref >39)
LDL Calculated: 78 mg/dL (ref 0–99)
Triglycerides: 151 mg/dL — ABNORMAL HIGH (ref 0–149)
VLDL CHOLESTEROL CAL: 30 mg/dL (ref 5–40)

## 2014-12-23 LAB — TSH: TSH: 0.76 u[IU]/mL (ref 0.450–4.500)

## 2014-12-28 ENCOUNTER — Telehealth: Payer: Self-pay | Admitting: Family Medicine

## 2015-01-12 DIAGNOSIS — F17210 Nicotine dependence, cigarettes, uncomplicated: Secondary | ICD-10-CM | POA: Diagnosis not present

## 2015-01-12 DIAGNOSIS — R609 Edema, unspecified: Secondary | ICD-10-CM | POA: Diagnosis not present

## 2015-01-12 DIAGNOSIS — K7030 Alcoholic cirrhosis of liver without ascites: Secondary | ICD-10-CM | POA: Diagnosis not present

## 2015-01-12 DIAGNOSIS — K7290 Hepatic failure, unspecified without coma: Secondary | ICD-10-CM | POA: Diagnosis not present

## 2015-02-06 ENCOUNTER — Other Ambulatory Visit: Payer: Self-pay | Admitting: Family Medicine

## 2015-02-11 DIAGNOSIS — K7030 Alcoholic cirrhosis of liver without ascites: Secondary | ICD-10-CM | POA: Diagnosis not present

## 2015-03-26 ENCOUNTER — Ambulatory Visit (INDEPENDENT_AMBULATORY_CARE_PROVIDER_SITE_OTHER): Payer: Commercial Managed Care - HMO | Admitting: Family Medicine

## 2015-03-26 ENCOUNTER — Encounter: Payer: Self-pay | Admitting: Family Medicine

## 2015-03-26 VITALS — BP 92/58 | HR 85 | Temp 97.7°F | Ht 69.0 in | Wt 182.6 lb

## 2015-03-26 DIAGNOSIS — K7030 Alcoholic cirrhosis of liver without ascites: Secondary | ICD-10-CM | POA: Diagnosis not present

## 2015-03-26 DIAGNOSIS — E119 Type 2 diabetes mellitus without complications: Secondary | ICD-10-CM | POA: Diagnosis not present

## 2015-03-26 DIAGNOSIS — E785 Hyperlipidemia, unspecified: Secondary | ICD-10-CM | POA: Diagnosis not present

## 2015-03-26 DIAGNOSIS — Z23 Encounter for immunization: Secondary | ICD-10-CM | POA: Diagnosis not present

## 2015-03-26 DIAGNOSIS — R7309 Other abnormal glucose: Secondary | ICD-10-CM

## 2015-03-26 DIAGNOSIS — R739 Hyperglycemia, unspecified: Secondary | ICD-10-CM

## 2015-03-26 LAB — POCT GLYCOSYLATED HEMOGLOBIN (HGB A1C): HEMOGLOBIN A1C: 6.3

## 2015-03-26 MED ORDER — ZOLPIDEM TARTRATE 10 MG PO TABS: 10.0000 mg | ORAL_TABLET | Freq: Every evening | ORAL | Status: DC | PRN

## 2015-03-26 MED ORDER — SPIRONOLACTONE 25 MG PO TABS: 25.0000 mg | ORAL_TABLET | Freq: Every day | ORAL | Status: DC

## 2015-03-26 MED ORDER — SPIRONOLACTONE 25 MG PO TABS: 25.0000 mg | ORAL_TABLET | Freq: Two times a day (BID) | ORAL | Status: DC

## 2015-03-26 MED ORDER — PANTOPRAZOLE SODIUM 40 MG PO TBEC: 40.0000 mg | DELAYED_RELEASE_TABLET | Freq: Every day | ORAL | Status: DC

## 2015-03-26 MED ORDER — LISINOPRIL 20 MG PO TABS: 20.0000 mg | ORAL_TABLET | Freq: Every day | ORAL | Status: DC

## 2015-03-26 MED ORDER — ATORVASTATIN CALCIUM 40 MG PO TABS: 40.0000 mg | ORAL_TABLET | Freq: Every day | ORAL | Status: DC

## 2015-03-26 MED ORDER — FLUOXETINE HCL 40 MG PO CAPS: 40.0000 mg | ORAL_CAPSULE | Freq: Every day | ORAL | Status: DC

## 2015-03-26 MED ORDER — ALBUTEROL SULFATE HFA 108 (90 BASE) MCG/ACT IN AERS: 2.0000 | INHALATION_SPRAY | Freq: Four times a day (QID) | RESPIRATORY_TRACT | Status: DC | PRN

## 2015-03-26 MED ORDER — MINOCYCLINE HCL 100 MG PO CAPS: 100.0000 mg | ORAL_CAPSULE | Freq: Two times a day (BID) | ORAL | Status: DC

## 2015-03-26 NOTE — Patient Instructions (Signed)

## 2015-03-26 NOTE — Progress Notes (Signed)
Subjective:  Patient ID: Spencer Stanley, male    DOB: [birth year]  Age: 54 y.o. MRN: [id number]  CC: Hyperlipidemia; Diabetes; Insomnia; and Depression   HPI Spencer Stanley presents for  follow-up of hypertension. Patient has no history of headache chest pain or shortness of breath or recent cough.  Patient also  in for Patient in for follow-up of elevated cholesterol. Doing well without complaints on current medication. Denies side effects of statin including myalgia and arthralgia and nausea. Also in today for liver function testing. Currently no chest pain, shortness of breath or other cardiovascular related symptoms noted.  Has hx of cirrhosis and followed by hepatology at [facility]. Trying to get on transplant list. Has chronic diarrhea. Takes xifaxan when he can get it. Lactulose tends to cause diarrhea, but has to take it to control ammonia. Follow-up of diabetes. Patient does not check blood sugar at home. Patient denies symptoms such as polyuria, polydipsia, excessive hunger, nausea No significant hypoglycemic spells noted.  Not sure what to look for. Medications as noted below. Taking them regularly without complication/adverse reaction being reported today.    History Spencer Stanley has a past medical history of Esophageal varices ([place]); Cirrhosis ([place]); Anxiety; Lyme disease; and [facility] spotted fever.   He has past surgical history that includes Hernia repair and Esophageal varice ligation.   His family history includes Aneurysm in his mother; Cancer in his father and mother; Dementia in his father; Diabetes in his sister; Heart failure in his father.He reports that he has been smoking.  He does not have any smokeless tobacco history on file. He reports that he does not drink alcohol or use illicit drugs.  Current Outpatient Prescriptions on File Prior to Visit  Medication Sig Dispense Refill  . CALCIUM-MAGNESIUM-ZINC PO Take 1 capsule by mouth 2 (two) times daily.    Marland Kitchen lactulose  (CHRONULAC) 10 GM/15ML solution Take 45 mLs (30 g total) by mouth 3 (three) times daily. 240 mL 11  . metFORMIN (GLUCOPHAGE-XR) 750 MG 24 hr tablet Take 2 tablets (1,500 mg total) by mouth daily with breakfast. 180 tablet 3  . multivitamin (ONE-A-DAY MEN'S) TABS tablet Take 1 tablet by mouth daily.    . rifaximin (XIFAXAN) 550 MG TABS tablet Take 1 tablet (550 mg total) by mouth 2 (two) times daily. 60 tablet 5   No current facility-administered medications on file prior to visit.    ROS Review of Systems  Constitutional: Negative for fever, chills and diaphoresis.  HENT: Negative for congestion, rhinorrhea and sore throat.   Respiratory: Negative for cough, shortness of breath and wheezing.   Cardiovascular: Negative for chest pain.  Gastrointestinal: Positive for nausea, diarrhea and abdominal distention. Negative for vomiting, abdominal pain and constipation.  Genitourinary: Negative for dysuria and frequency.  Musculoskeletal: Negative for joint swelling and arthralgias.  Skin: Negative for color change, pallor and rash.  Neurological: Negative for headaches.    Objective:  BP 92/58 mmHg  Pulse 85  Temp(Src) 97.7 F (36.5 C) (Oral)  Ht '5\' 9"'  (1.753 m)  Wt 182 lb 9.6 oz (82.827 kg)  BMI 26.95 kg/m2  SpO2 99%  BP Readings from Last 3 Encounters:  03/26/15 92/58  12/22/14 83/54  09/23/14 97/55    Wt Readings from Last 3 Encounters:  03/26/15 182 lb 9.6 oz (82.827 kg)  12/22/14 182 lb (82.555 kg)  09/23/14 185 lb (83.915 kg)     Physical Exam  Constitutional: He is oriented to person, place, and time. He appears well-developed  and well-nourished. No distress.  HENT:  Head: Normocephalic and atraumatic.  Right Ear: External ear normal.  Left Ear: External ear normal.  Nose: Nose normal.  Mouth/Throat: Oropharynx is clear and moist.  Eyes: Conjunctivae and EOM are normal. Pupils are equal, round, and reactive to light.  Neck: Normal range of motion. Neck supple. No  thyromegaly present.  Cardiovascular: Normal rate, regular rhythm and normal heart sounds.   No murmur heard. Pulmonary/Chest: Effort normal and breath sounds normal. No respiratory distress. He has no wheezes. He has no rales.  Abdominal: Soft. Bowel sounds are normal. He exhibits no distension. There is no tenderness.  Lymphadenopathy:    He has no cervical adenopathy.  Neurological: He is alert and oriented to person, place, and time. He has normal reflexes.  Skin: Skin is warm and dry. No rash noted. No pallor.  No jaundice   Psychiatric: He has a normal mood and affect. His behavior is normal. Judgment and thought content normal.    Lab Results  Component Value Date   HGBA1C 6.3 03/26/2015   HGBA1C 5.7 12/22/2014   HGBA1C 12.0% 08/24/2014    Lab Results  Component Value Date   WBC CANCELED 03/26/2015   HGB 12.6* 12/12/2013   HCT CANCELED 03/26/2015   PLT 91* 09/12/2013   GLUCOSE 174* 03/26/2015   CHOL 130 03/26/2015   TRIG 85 03/26/2015   HDL 49 03/26/2015   LDLCALC 64 03/26/2015   ALT 27 03/26/2015   AST 43* 03/26/2015   NA 136 03/26/2015   K 4.5 03/26/2015   CL 102 03/26/2015   CREATININE 0.77 03/26/2015   BUN 8 03/26/2015   CO2 16* 03/26/2015   TSH 0.760 12/22/2014   INR 1.3* 06/18/2013   HGBA1C 6.3 03/26/2015    Patient was never admitted.  Assessment & Plan:   Spencer Stanley was seen today for hyperlipidemia, diabetes, insomnia and depression.  Diagnoses and all orders for this visit:  Type 2 diabetes mellitus without complication, without long-term current use of insulin (HCC) -     CMP14+EGFR; Future -     Ammonia  Elevated blood sugar -     POCT glycosylated hemoglobin (Hb A1C) -     CMP14+EGFR -     CBC with Differential/Platelet -     CMP14+EGFR; Future -     Ammonia  Hyperlipemia -     Lipid panel -     CBC with Differential/Platelet -     CMP14+EGFR; Future -     Ammonia  Encounter for immunization  Alcoholic cirrhosis of liver without  ascites ([place]) -     CMP14+EGFR; Future -     Ammonia  Other orders -     FLUoxetine (PROZAC) 40 MG capsule; Take 1 capsule (40 mg total) by mouth daily. -     lisinopril (PRINIVIL,ZESTRIL) 20 MG tablet; Take 1 tablet (20 mg total) by mouth daily. To protect kidneys from potential diabetes damage. -     atorvastatin (LIPITOR) 40 MG tablet; Take 1 tablet (40 mg total) by mouth daily. For cholesterol -     minocycline (MINOCIN) 100 MG capsule; Take 1 capsule (100 mg total) by mouth 2 (two) times daily. -     pantoprazole (PROTONIX) 40 MG tablet; Take 1 tablet (40 mg total) by mouth daily. -     Discontinue: spironolactone (ALDACTONE) 25 MG tablet; Take 1 tablet (25 mg total) by mouth 2 (two) times daily. -     zolpidem (AMBIEN) 10 MG tablet;  Take 1 tablet (10 mg total) by mouth at bedtime as needed for sleep. -     albuterol (PROVENTIL HFA;VENTOLIN HFA) 108 (90 BASE) MCG/ACT inhaler; Inhale 2 puffs into the lungs every 6 (six) hours as needed for wheezing or shortness of breath. -     Flu Vaccine QUAD 36+ mos IM -     Discontinue: spironolactone (ALDACTONE) 25 MG tablet; Take 1 tablet (25 mg total) by mouth daily. -     spironolactone (ALDACTONE) 25 MG tablet; Take 1 tablet (25 mg total) by mouth daily.   I have changed Mr. Yom FLUoxetine and pantoprazole. I am also having him maintain his multivitamin, lactulose, rifaximin, CALCIUM-MAGNESIUM-ZINC PO, metFORMIN, lisinopril, atorvastatin, minocycline, zolpidem, albuterol, and spironolactone.  Meds ordered this encounter  Medications  . FLUoxetine (PROZAC) 40 MG capsule    Sig: Take 1 capsule (40 mg total) by mouth daily.    Dispense:  90 capsule    Refill:  3  . lisinopril (PRINIVIL,ZESTRIL) 20 MG tablet    Sig: Take 1 tablet (20 mg total) by mouth daily. To protect kidneys from potential diabetes damage.    Dispense:  90 tablet    Refill:  1  . atorvastatin (LIPITOR) 40 MG tablet    Sig: Take 1 tablet (40 mg total) by mouth daily. For  cholesterol    Dispense:  90 tablet    Refill:  3  . minocycline (MINOCIN) 100 MG capsule    Sig: Take 1 capsule (100 mg total) by mouth 2 (two) times daily.    Dispense:  180 capsule    Refill:  1  . pantoprazole (PROTONIX) 40 MG tablet    Sig: Take 1 tablet (40 mg total) by mouth daily.    Dispense:  90 tablet    Refill:  4  . DISCONTD: spironolactone (ALDACTONE) 25 MG tablet    Sig: Take 1 tablet (25 mg total) by mouth 2 (two) times daily.    Dispense:  180 tablet    Refill:  1  . zolpidem (AMBIEN) 10 MG tablet    Sig: Take 1 tablet (10 mg total) by mouth at bedtime as needed for sleep.    Dispense:  90 tablet    Refill:  1  . albuterol (PROVENTIL HFA;VENTOLIN HFA) 108 (90 BASE) MCG/ACT inhaler    Sig: Inhale 2 puffs into the lungs every 6 (six) hours as needed for wheezing or shortness of breath.    Dispense:  18 g    Refill:  3  . DISCONTD: spironolactone (ALDACTONE) 25 MG tablet    Sig: Take 1 tablet (25 mg total) by mouth daily.    Dispense:  90 tablet    Refill:  1  . spironolactone (ALDACTONE) 25 MG tablet    Sig: Take 1 tablet (25 mg total) by mouth daily.    Dispense:  90 tablet    Refill:  1   Reviewed sx of hypoglycemia. Handout given.  Follow-up: Return in about 3 months (around 06/26/2015), or if symptoms worsen or fail to improve.  Claretta Fraise, M.D.

## 2015-03-27 LAB — CMP14+EGFR
ALBUMIN: 3.7 g/dL (ref 3.5–5.5)
ALT: 27 IU/L (ref 0–44)
AST: 43 IU/L — ABNORMAL HIGH (ref 0–40)
Albumin/Globulin Ratio: 1.5 (ref 1.1–2.5)
Alkaline Phosphatase: 117 IU/L (ref 39–117)
BILIRUBIN TOTAL: 0.9 mg/dL (ref 0.0–1.2)
BUN / CREAT RATIO: 10 (ref 9–20)
BUN: 8 mg/dL (ref 6–24)
CHLORIDE: 102 mmol/L (ref 97–106)
CO2: 16 mmol/L — ABNORMAL LOW (ref 18–29)
Calcium: 9 mg/dL (ref 8.7–10.2)
Creatinine, Ser: 0.77 mg/dL (ref 0.76–1.27)
GFR, EST AFRICAN AMERICAN: 118 mL/min/{1.73_m2} (ref >59)
GFR, EST NON AFRICAN AMERICAN: 102 mL/min/{1.73_m2} (ref >59)
GLUCOSE: 174 mg/dL — AB (ref 65–99)
Globulin, Total: 2.5 g/dL (ref 1.5–4.5)
Potassium: 4.5 mmol/L (ref 3.5–5.2)
Sodium: 136 mmol/L (ref 136–144)
TOTAL PROTEIN: 6.2 g/dL (ref 6.0–8.5)

## 2015-03-27 LAB — CBC WITH DIFFERENTIAL/PLATELET

## 2015-03-27 LAB — LIPID PANEL
CHOLESTEROL TOTAL: 130 mg/dL (ref 100–199)
Chol/HDL Ratio: 2.7 ratio units (ref 0.0–5.0)
HDL: 49 mg/dL (ref >39)
LDL Calculated: 64 mg/dL (ref 0–99)
Triglycerides: 85 mg/dL (ref 0–149)
VLDL Cholesterol Cal: 17 mg/dL (ref 5–40)

## 2015-04-06 ENCOUNTER — Telehealth: Payer: Self-pay | Admitting: *Deleted

## 2015-04-06 NOTE — Telephone Encounter (Signed)
Patient needs diabetic eye exam this year. If he has had one then we need the records to scan into the EMR.

## 2015-05-21 ENCOUNTER — Encounter: Payer: Self-pay | Admitting: Family Medicine

## 2015-05-21 ENCOUNTER — Other Ambulatory Visit: Payer: Self-pay | Admitting: Family Medicine

## 2015-05-21 ENCOUNTER — Ambulatory Visit (INDEPENDENT_AMBULATORY_CARE_PROVIDER_SITE_OTHER): Payer: Commercial Managed Care - HMO | Admitting: Family Medicine

## 2015-05-21 VITALS — BP 111/64 | HR 60 | Temp 97.0°F | Ht 69.0 in | Wt 185.0 lb

## 2015-05-21 DIAGNOSIS — B351 Tinea unguium: Secondary | ICD-10-CM | POA: Diagnosis not present

## 2015-05-21 DIAGNOSIS — K7030 Alcoholic cirrhosis of liver without ascites: Secondary | ICD-10-CM

## 2015-05-21 DIAGNOSIS — R7309 Other abnormal glucose: Secondary | ICD-10-CM | POA: Diagnosis not present

## 2015-05-21 DIAGNOSIS — E785 Hyperlipidemia, unspecified: Secondary | ICD-10-CM | POA: Diagnosis not present

## 2015-05-21 DIAGNOSIS — R739 Hyperglycemia, unspecified: Secondary | ICD-10-CM

## 2015-05-21 DIAGNOSIS — G4700 Insomnia, unspecified: Secondary | ICD-10-CM

## 2015-05-21 LAB — POCT GLYCOSYLATED HEMOGLOBIN (HGB A1C): HEMOGLOBIN A1C: 6.3

## 2015-05-21 MED ORDER — CICLOPIROX 8 % EX SOLN: Freq: Every day | CUTANEOUS | Status: DC

## 2015-05-21 MED ORDER — ZOLPIDEM TARTRATE 10 MG PO TABS: 10.0000 mg | ORAL_TABLET | Freq: Every evening | ORAL | Status: DC | PRN

## 2015-05-21 MED ORDER — RIFAXIMIN 550 MG PO TABS: 550.0000 mg | ORAL_TABLET | Freq: Two times a day (BID) | ORAL | Status: AC

## 2015-05-21 NOTE — Progress Notes (Signed)
Subjective:  Patient ID: Spencer Stanley, male    DOB: [birth year]  Age: 55 y.o. MRN: [id number]  CC: Hypertension; Diabetes; Hyperlipidemia; Insomnia; and Back Pain   HPI [name] presents for  follow-up of hypertension. Patient has no history of headache chest pain or shortness of breath or recent cough. Patient also denies symptoms of TIA such as numbness weakness lateralizing. Patient checks  blood pressure at home and has not had any elevated readings recently. Patient denies side effects from his medication. States taking it regularly.  Patient also  in for follow-up of elevated cholesterol. Doing well without complaints on current medication. Denies side effects of statin including myalgia and arthralgia and nausea. Also in today for liver function testing. Currently no chest pain, shortness of breath or other cardiovascular related symptoms noted.  Follow-up of diabetes. Patient does check blood sugar at home. Readings run between 100  and 150 Patient denies symptoms such as polyuria, polydipsia, excessive hunger, nausea No significant hypoglycemic spells noted. Medications as noted below. Taking them regularly without complication/adverse reaction being reported today.   Followed by GI for cirrhosis. Still has loose BMs but better with xifaxan. Can't afford but getting through pt. Assistance program. No abd sx of pain. Denies jaundice.   History [name] has a past medical history of Esophageal varices ([place]); Cirrhosis ([place]); Anxiety; Lyme disease; and [facility] spotted fever.   He has past surgical history that includes Hernia repair and Esophageal varice ligation.   His family history includes Aneurysm in his mother; Cancer in his father and mother; Dementia in his father; Diabetes in his sister; Heart failure in his father.He reports that he has been smoking.  He does not have any smokeless tobacco history on file. He reports that he does not drink alcohol or use illicit  drugs.  Current Outpatient Prescriptions on File Prior to Visit  Medication Sig Dispense Refill  . albuterol (PROVENTIL HFA;VENTOLIN HFA) 108 (90 BASE) MCG/ACT inhaler Inhale 2 puffs into the lungs every 6 (six) hours as needed for wheezing or shortness of breath. 18 g 3  . atorvastatin (LIPITOR) 40 MG tablet Take 1 tablet (40 mg total) by mouth daily. For cholesterol 90 tablet 3  . CALCIUM-MAGNESIUM-ZINC PO Take 1 capsule by mouth 2 (two) times daily.    Marland Kitchen FLUoxetine (PROZAC) 40 MG capsule Take 1 capsule (40 mg total) by mouth daily. 90 capsule 3  . lactulose (CHRONULAC) 10 GM/15ML solution Take 45 mLs (30 g total) by mouth 3 (three) times daily. 240 mL 11  . lisinopril (PRINIVIL,ZESTRIL) 20 MG tablet Take 1 tablet (20 mg total) by mouth daily. To protect kidneys from potential diabetes damage. 90 tablet 1  . minocycline (MINOCIN) 100 MG capsule Take 1 capsule (100 mg total) by mouth 2 (two) times daily. 180 capsule 1  . multivitamin (ONE-A-DAY MEN'S) TABS tablet Take 1 tablet by mouth daily.    . pantoprazole (PROTONIX) 40 MG tablet Take 1 tablet (40 mg total) by mouth daily. 90 tablet 4  . spironolactone (ALDACTONE) 25 MG tablet Take 1 tablet (25 mg total) by mouth daily. 90 tablet 1   No current facility-administered medications on file prior to visit.    ROS Review of Systems  Constitutional: Negative for fever, chills, diaphoresis and unexpected weight change.  HENT: Negative for congestion, hearing loss, rhinorrhea and sore throat.   Eyes: Negative for visual disturbance.  Respiratory: Negative for cough and shortness of breath.   Cardiovascular: Negative for chest pain.  Gastrointestinal: Negative for abdominal pain, diarrhea and constipation.  Genitourinary: Negative for dysuria and flank pain.  Musculoskeletal: Negative for joint swelling and arthralgias.  Skin: Negative for rash.  Neurological: Negative for dizziness and headaches.  Psychiatric/Behavioral: Negative for sleep  disturbance and dysphoric mood.    Objective:  BP 111/64 mmHg  Pulse 60  Temp(Src) 97 F (36.1 C) (Oral)  Ht '5\' 9"'  (1.753 m)  Wt 185 lb (83.915 kg)  BMI 27.31 kg/m2  SpO2 98%  BP Readings from Last 3 Encounters:  05/21/15 111/64  03/26/15 92/58  12/22/14 83/54    Wt Readings from Last 3 Encounters:  05/21/15 185 lb (83.915 kg)  03/26/15 182 lb 9.6 oz (82.827 kg)  12/22/14 182 lb (82.555 kg)     Physical Exam  Constitutional: He is oriented to person, place, and time. He appears well-developed and well-nourished. No distress.  HENT:  Head: Normocephalic and atraumatic.  Right Ear: External ear normal.  Left Ear: External ear normal.  Nose: Nose normal.  Mouth/Throat: Oropharynx is clear and moist.  Eyes: Conjunctivae and EOM are normal. Pupils are equal, round, and reactive to light.  Neck: Normal range of motion. Neck supple. No thyromegaly present.  Cardiovascular: Normal rate, regular rhythm and normal heart sounds.   No murmur heard. Pulmonary/Chest: Effort normal and breath sounds normal. No respiratory distress. He has no wheezes. He has no rales.  Abdominal: Soft. Bowel sounds are normal. He exhibits distension. He exhibits no mass. There is no tenderness. There is no rebound and no guarding.  Lymphadenopathy:    He has no cervical adenopathy.  Neurological: He is alert and oriented to person, place, and time. He has normal reflexes.  Skin: Skin is warm and dry.  Psychiatric: He has a normal mood and affect. His behavior is normal. Judgment and thought content normal.   Diabetic Foot Exam - Simple   Simple Foot Form  Visual Inspection  No deformities, no ulcerations, no other skin breakdown bilaterally:  Yes  See comments:  Yes  Sensation Testing  Intact to touch and monofilament testing bilaterally:  Yes  Pulse Check  Posterior Tibialis and Dorsalis pulse intact bilaterally:  Yes  Comments       Lab Results  Component Value Date   HGBA1C 6.3  05/21/2015   HGBA1C 6.3 03/26/2015   HGBA1C 5.7 12/22/2014    Lab Results  Component Value Date   WBC CANCELED 03/26/2015   HGB 12.6* 12/12/2013   HCT CANCELED 03/26/2015   PLT CANCELED 03/26/2015   GLUCOSE 115* 05/21/2015   CHOL 105 05/21/2015   TRIG 90 05/21/2015   HDL 37* 05/21/2015   LDLCALC 50 05/21/2015   ALT 21 05/21/2015   AST 27 05/21/2015   NA 140 05/21/2015   K 4.3 05/21/2015   CL 107* 05/21/2015   CREATININE 0.79 05/21/2015   BUN 13 05/21/2015   CO2 20 05/21/2015   TSH 0.760 12/22/2014   INR 1.3* 06/18/2013   HGBA1C 6.3 05/21/2015    Patient was never admitted.  Assessment & Plan:   Tyeler was seen today for hypertension, diabetes, hyperlipidemia, insomnia and back pain.  Diagnoses and all orders for this visit:  Hyperlipemia -     Cancel: Lipid panel -     Cancel: Lipid panel -     CMP14+EGFR -     Ammonia  Elevated blood sugar -     Cancel: CMP14+EGFR -     POCT glycosylated hemoglobin (Hb A1C) -  CMP14+EGFR -     Ammonia  Alcoholic cirrhosis of liver without ascites (HCC) -     Cancel: Ammonia -     CMP14+EGFR -     Ammonia  Other orders -     zolpidem (AMBIEN) 10 MG tablet; Take 1 tablet (10 mg total) by mouth at bedtime as needed for sleep. -     rifaximin (XIFAXAN) 550 MG TABS tablet; Take 1 tablet (550 mg total) by mouth 2 (two) times daily. -     ciclopirox (PENLAC) 8 % solution; Apply topically at bedtime. Apply over nail and surrounding skin. Apply daily over previous coat. After seven (7) days, may remove with alcohol and continue cycle. -     Lipid panel   I am having Mr. Gulley start on ciclopirox. I am also having him maintain his multivitamin, lactulose, CALCIUM-MAGNESIUM-ZINC PO, FLUoxetine, lisinopril, atorvastatin, minocycline, pantoprazole, albuterol, spironolactone, zolpidem, and rifaximin.  Meds ordered this encounter  Medications  . zolpidem (AMBIEN) 10 MG tablet    Sig: Take 1 tablet (10 mg total) by mouth at bedtime  as needed for sleep.    Dispense:  90 tablet    Refill:  1  . rifaximin (XIFAXAN) 550 MG TABS tablet    Sig: Take 1 tablet (550 mg total) by mouth 2 (two) times daily.    Dispense:  60 tablet    Refill:  5  . ciclopirox (PENLAC) 8 % solution    Sig: Apply topically at bedtime. Apply over nail and surrounding skin. Apply daily over previous coat. After seven (7) days, may remove with alcohol and continue cycle.    Dispense:  6.6 mL    Refill:  5   Foot care handout given  Follow-up: Return in about 3 months (around 08/19/2015).  Claretta Fraise, M.D.

## 2015-05-21 NOTE — Patient Instructions (Signed)
Diabetes and Foot Care Diabetes may cause you to have problems because of poor blood supply (circulation) to your feet and legs. This may cause the skin on your feet to become thinner, break easier, and heal more slowly. Your skin may become dry, and the skin may peel and crack. You may also have nerve damage in your legs and feet causing decreased feeling in them. You may not notice minor injuries to your feet that could lead to infections or more serious problems. Taking care of your feet is one of the most important things you can do for yourself.  HOME CARE INSTRUCTIONS  Wear shoes at all times, even in the house. Do not go barefoot. Bare feet are easily injured.  Check your feet daily for blisters, cuts, and redness. If you cannot see the bottom of your feet, use a mirror or ask someone for help.  Wash your feet with warm water (do not use hot water) and mild soap. Then pat your feet and the areas between your toes until they are completely dry. Do not soak your feet as this can dry your skin.  Apply a moisturizing lotion or petroleum jelly (that does not contain alcohol and is unscented) to the skin on your feet and to dry, brittle toenails. Do not apply lotion between your toes.  Trim your toenails straight across. Do not dig under them or around the cuticle. File the edges of your nails with an emery board or nail file.  Do not cut corns or calluses or try to remove them with medicine.  Wear clean socks or stockings every day. Make sure they are not too tight. Do not wear knee-high stockings since they may decrease blood flow to your legs.  Wear shoes that fit properly and have enough cushioning. To break in new shoes, wear them for just a few hours a day. This prevents you from injuring your feet. Always look in your shoes before you put them on to be sure there are no objects inside.  Do not cross your legs. This may decrease the blood flow to your feet.  If you find a minor scrape,  cut, or break in the skin on your feet, keep it and the skin around it clean and dry. These areas may be cleansed with mild soap and water. Do not cleanse the area with peroxide, alcohol, or iodine.  When you remove an adhesive bandage, be sure not to damage the skin around it.  If you have a wound, look at it several times a day to make sure it is healing.  Do not use heating pads or hot water bottles. They may burn your skin. If you have lost feeling in your feet or legs, you may not know it is happening until it is too late.  Make sure your health care provider performs a complete foot exam at least annually or more often if you have foot problems. Report any cuts, sores, or bruises to your health care provider immediately. SEEK MEDICAL CARE IF:   You have an injury that is not healing.  You have cuts or breaks in the skin.  You have an ingrown nail.  You notice redness on your legs or feet.  You feel burning or tingling in your legs or feet.  You have pain or cramps in your legs and feet.  Your legs or feet are numb.  Your feet always feel cold. SEEK IMMEDIATE MEDICAL CARE IF:   There is increasing redness,   swelling, or pain in or around a wound.  There is a red line that goes up your leg.  Pus is coming from a wound.  You develop a fever or as directed by your health care provider.  You notice a bad smell coming from an ulcer or wound.   This information is not intended to replace advice given to you by your health care provider. Make sure you discuss any questions you have with your health care provider.   Document Released: 04/28/2000 Document Revised: 01/01/2013 Document Reviewed: 10/08/2012 Elsevier Interactive Patient Education 2016 [employer]  

## 2015-05-22 LAB — CMP14+EGFR
A/G RATIO: 1.5 (ref 1.1–2.5)
ALBUMIN: 3.7 g/dL (ref 3.5–5.5)
ALT: 21 IU/L (ref 0–44)
AST: 27 IU/L (ref 0–40)
Alkaline Phosphatase: 114 IU/L (ref 39–117)
BILIRUBIN TOTAL: 1 mg/dL (ref 0.0–1.2)
BUN / CREAT RATIO: 16 (ref 9–20)
BUN: 13 mg/dL (ref 6–24)
CHLORIDE: 107 mmol/L — AB (ref 96–106)
CO2: 20 mmol/L (ref 18–29)
Calcium: 9.5 mg/dL (ref 8.7–10.2)
Creatinine, Ser: 0.79 mg/dL (ref 0.76–1.27)
GFR calc non Af Amer: 101 mL/min/{1.73_m2} (ref >59)
GFR, EST AFRICAN AMERICAN: 117 mL/min/{1.73_m2} (ref >59)
GLOBULIN, TOTAL: 2.5 g/dL (ref 1.5–4.5)
Glucose: 115 mg/dL — ABNORMAL HIGH (ref 65–99)
POTASSIUM: 4.3 mmol/L (ref 3.5–5.2)
SODIUM: 140 mmol/L (ref 134–144)
TOTAL PROTEIN: 6.2 g/dL (ref 6.0–8.5)

## 2015-05-22 LAB — LIPID PANEL
CHOL/HDL RATIO: 2.8 ratio (ref 0.0–5.0)
Cholesterol, Total: 105 mg/dL (ref 100–199)
HDL: 37 mg/dL — ABNORMAL LOW (ref >39)
LDL CALC: 50 mg/dL (ref 0–99)
Triglycerides: 90 mg/dL (ref 0–149)
VLDL CHOLESTEROL CAL: 18 mg/dL (ref 5–40)

## 2015-05-23 LAB — AMMONIA: AMMONIA: 269 ug/dL — AB (ref 27–102)

## 2015-05-24 ENCOUNTER — Telehealth: Payer: Self-pay | Admitting: Family Medicine

## 2015-05-24 MED ORDER — CICLOPIROX OLAMINE 0.77 % EX CREA: TOPICAL_CREAM | Freq: Two times a day (BID) | CUTANEOUS | Status: DC

## 2015-05-24 MED ORDER — DAKINS (1/2 STRENGTH) 0.25 % EX SOLN: CUTANEOUS | Status: DC

## 2015-05-24 NOTE — Telephone Encounter (Signed)
Patient aware of medication sent to pharmacy.

## 2015-05-24 NOTE — Telephone Encounter (Signed)
Have him soak his feet in dakin's solution daily. (See order. Apply this cream after that as well as one other time daily.

## 2015-06-04 DIAGNOSIS — F17210 Nicotine dependence, cigarettes, uncomplicated: Secondary | ICD-10-CM | POA: Diagnosis not present

## 2015-06-04 DIAGNOSIS — R609 Edema, unspecified: Secondary | ICD-10-CM | POA: Diagnosis not present

## 2015-06-04 DIAGNOSIS — R1310 Dysphagia, unspecified: Secondary | ICD-10-CM | POA: Diagnosis not present

## 2015-06-04 DIAGNOSIS — K7290 Hepatic failure, unspecified without coma: Secondary | ICD-10-CM | POA: Diagnosis not present

## 2015-06-04 DIAGNOSIS — E785 Hyperlipidemia, unspecified: Secondary | ICD-10-CM | POA: Diagnosis not present

## 2015-06-04 DIAGNOSIS — Z79899 Other long term (current) drug therapy: Secondary | ICD-10-CM | POA: Diagnosis not present

## 2015-06-04 DIAGNOSIS — K7030 Alcoholic cirrhosis of liver without ascites: Secondary | ICD-10-CM | POA: Diagnosis not present

## 2015-06-04 DIAGNOSIS — E119 Type 2 diabetes mellitus without complications: Secondary | ICD-10-CM | POA: Diagnosis not present

## 2015-06-04 DIAGNOSIS — Z7984 Long term (current) use of oral hypoglycemic drugs: Secondary | ICD-10-CM | POA: Diagnosis not present

## 2015-06-06 DIAGNOSIS — K7460 Unspecified cirrhosis of liver: Secondary | ICD-10-CM | POA: Insufficient documentation

## 2015-06-06 DIAGNOSIS — K7290 Hepatic failure, unspecified without coma: Secondary | ICD-10-CM | POA: Insufficient documentation

## 2015-06-06 DIAGNOSIS — K7682 Hepatic encephalopathy: Secondary | ICD-10-CM | POA: Insufficient documentation

## 2015-06-16 DIAGNOSIS — H5210 Myopia, unspecified eye: Secondary | ICD-10-CM | POA: Diagnosis not present

## 2015-06-16 DIAGNOSIS — E109 Type 1 diabetes mellitus without complications: Secondary | ICD-10-CM | POA: Diagnosis not present

## 2015-06-16 DIAGNOSIS — H5200 Hypermetropia, unspecified eye: Secondary | ICD-10-CM | POA: Diagnosis not present

## 2015-06-16 LAB — HM DIABETES EYE EXAM

## 2015-06-17 ENCOUNTER — Encounter: Payer: Self-pay | Admitting: *Deleted

## 2015-07-05 ENCOUNTER — Other Ambulatory Visit: Payer: Self-pay | Admitting: Family Medicine

## 2015-07-16 DIAGNOSIS — R7989 Other specified abnormal findings of blood chemistry: Secondary | ICD-10-CM | POA: Diagnosis not present

## 2015-07-16 DIAGNOSIS — F32A Depression, unspecified: Secondary | ICD-10-CM | POA: Insufficient documentation

## 2015-07-16 DIAGNOSIS — F17210 Nicotine dependence, cigarettes, uncomplicated: Secondary | ICD-10-CM | POA: Diagnosis not present

## 2015-07-16 DIAGNOSIS — E860 Dehydration: Secondary | ICD-10-CM | POA: Diagnosis not present

## 2015-07-16 DIAGNOSIS — K219 Gastro-esophageal reflux disease without esophagitis: Secondary | ICD-10-CM | POA: Insufficient documentation

## 2015-07-16 DIAGNOSIS — Z8679 Personal history of other diseases of the circulatory system: Secondary | ICD-10-CM | POA: Diagnosis not present

## 2015-07-16 DIAGNOSIS — R5383 Other fatigue: Secondary | ICD-10-CM | POA: Diagnosis not present

## 2015-07-16 DIAGNOSIS — F329 Major depressive disorder, single episode, unspecified: Secondary | ICD-10-CM | POA: Insufficient documentation

## 2015-07-16 DIAGNOSIS — I950 Idiopathic hypotension: Secondary | ICD-10-CM | POA: Diagnosis not present

## 2015-07-16 DIAGNOSIS — Z8719 Personal history of other diseases of the digestive system: Secondary | ICD-10-CM | POA: Diagnosis not present

## 2015-07-16 DIAGNOSIS — E7220 Disorder of urea cycle metabolism, unspecified: Secondary | ICD-10-CM | POA: Diagnosis not present

## 2015-07-16 DIAGNOSIS — Z8639 Personal history of other endocrine, nutritional and metabolic disease: Secondary | ICD-10-CM | POA: Diagnosis not present

## 2015-07-16 DIAGNOSIS — E119 Type 2 diabetes mellitus without complications: Secondary | ICD-10-CM | POA: Insufficient documentation

## 2015-07-21 DIAGNOSIS — R609 Edema, unspecified: Secondary | ICD-10-CM | POA: Diagnosis not present

## 2015-07-21 DIAGNOSIS — K3189 Other diseases of stomach and duodenum: Secondary | ICD-10-CM | POA: Diagnosis not present

## 2015-07-21 DIAGNOSIS — K2950 Unspecified chronic gastritis without bleeding: Secondary | ICD-10-CM | POA: Diagnosis not present

## 2015-07-21 DIAGNOSIS — E119 Type 2 diabetes mellitus without complications: Secondary | ICD-10-CM | POA: Diagnosis not present

## 2015-07-21 DIAGNOSIS — K7290 Hepatic failure, unspecified without coma: Secondary | ICD-10-CM | POA: Diagnosis not present

## 2015-07-21 DIAGNOSIS — E785 Hyperlipidemia, unspecified: Secondary | ICD-10-CM | POA: Diagnosis not present

## 2015-07-21 DIAGNOSIS — K317 Polyp of stomach and duodenum: Secondary | ICD-10-CM | POA: Diagnosis not present

## 2015-07-21 DIAGNOSIS — K766 Portal hypertension: Secondary | ICD-10-CM | POA: Diagnosis not present

## 2015-07-21 DIAGNOSIS — K210 Gastro-esophageal reflux disease with esophagitis: Secondary | ICD-10-CM | POA: Diagnosis not present

## 2015-07-21 DIAGNOSIS — R1310 Dysphagia, unspecified: Secondary | ICD-10-CM | POA: Diagnosis not present

## 2015-07-21 DIAGNOSIS — K7460 Unspecified cirrhosis of liver: Secondary | ICD-10-CM | POA: Diagnosis not present

## 2015-08-02 DIAGNOSIS — K7290 Hepatic failure, unspecified without coma: Secondary | ICD-10-CM | POA: Diagnosis not present

## 2015-08-02 DIAGNOSIS — F17210 Nicotine dependence, cigarettes, uncomplicated: Secondary | ICD-10-CM | POA: Diagnosis not present

## 2015-08-02 DIAGNOSIS — R1314 Dysphagia, pharyngoesophageal phase: Secondary | ICD-10-CM | POA: Diagnosis not present

## 2015-08-02 DIAGNOSIS — E119 Type 2 diabetes mellitus without complications: Secondary | ICD-10-CM | POA: Diagnosis not present

## 2015-08-02 DIAGNOSIS — K7040 Alcoholic hepatic failure without coma: Secondary | ICD-10-CM | POA: Diagnosis not present

## 2015-08-02 DIAGNOSIS — K7460 Unspecified cirrhosis of liver: Secondary | ICD-10-CM | POA: Diagnosis not present

## 2015-08-02 DIAGNOSIS — K7030 Alcoholic cirrhosis of liver without ascites: Secondary | ICD-10-CM | POA: Diagnosis not present

## 2015-08-02 DIAGNOSIS — Z7984 Long term (current) use of oral hypoglycemic drugs: Secondary | ICD-10-CM | POA: Diagnosis not present

## 2015-08-02 DIAGNOSIS — I8390 Asymptomatic varicose veins of unspecified lower extremity: Secondary | ICD-10-CM | POA: Diagnosis not present

## 2015-08-02 DIAGNOSIS — K449 Diaphragmatic hernia without obstruction or gangrene: Secondary | ICD-10-CM | POA: Diagnosis not present

## 2015-08-02 DIAGNOSIS — E785 Hyperlipidemia, unspecified: Secondary | ICD-10-CM | POA: Diagnosis not present

## 2015-08-18 ENCOUNTER — Other Ambulatory Visit: Payer: Commercial Managed Care - HMO

## 2015-08-18 DIAGNOSIS — K7030 Alcoholic cirrhosis of liver without ascites: Secondary | ICD-10-CM

## 2015-08-18 DIAGNOSIS — R7309 Other abnormal glucose: Secondary | ICD-10-CM | POA: Diagnosis not present

## 2015-08-18 DIAGNOSIS — E119 Type 2 diabetes mellitus without complications: Secondary | ICD-10-CM | POA: Diagnosis not present

## 2015-08-18 DIAGNOSIS — R739 Hyperglycemia, unspecified: Secondary | ICD-10-CM

## 2015-08-18 DIAGNOSIS — E785 Hyperlipidemia, unspecified: Secondary | ICD-10-CM

## 2015-08-19 LAB — CMP14+EGFR
A/G RATIO: 1.7 (ref 1.2–2.2)
ALK PHOS: 125 IU/L — AB (ref 39–117)
ALT: 26 IU/L (ref 0–44)
AST: 33 IU/L (ref 0–40)
Albumin: 3.8 g/dL (ref 3.5–5.5)
BILIRUBIN TOTAL: 1.2 mg/dL (ref 0.0–1.2)
BUN/Creatinine Ratio: 12 (ref 9–20)
BUN: 8 mg/dL (ref 6–24)
CHLORIDE: 104 mmol/L (ref 96–106)
CO2: 19 mmol/L (ref 18–29)
Calcium: 9 mg/dL (ref 8.7–10.2)
Creatinine, Ser: 0.69 mg/dL — ABNORMAL LOW (ref 0.76–1.27)
GFR calc non Af Amer: 107 mL/min/{1.73_m2} (ref >59)
GFR, EST AFRICAN AMERICAN: 124 mL/min/{1.73_m2} (ref >59)
GLUCOSE: 105 mg/dL — AB (ref 65–99)
Globulin, Total: 2.3 g/dL (ref 1.5–4.5)
POTASSIUM: 4 mmol/L (ref 3.5–5.2)
Sodium: 140 mmol/L (ref 134–144)
Total Protein: 6.1 g/dL (ref 6.0–8.5)

## 2015-08-19 LAB — AMMONIA: AMMONIA: 277 ug/dL — AB (ref 27–102)

## 2015-08-20 ENCOUNTER — Other Ambulatory Visit: Payer: Commercial Managed Care - HMO

## 2015-08-25 ENCOUNTER — Ambulatory Visit (INDEPENDENT_AMBULATORY_CARE_PROVIDER_SITE_OTHER): Payer: Commercial Managed Care - HMO | Admitting: Family Medicine

## 2015-08-25 ENCOUNTER — Encounter: Payer: Self-pay | Admitting: Family Medicine

## 2015-08-25 VITALS — BP 85/57 | HR 83 | Temp 97.6°F | Ht 69.0 in | Wt 182.2 lb

## 2015-08-25 DIAGNOSIS — K7031 Alcoholic cirrhosis of liver with ascites: Secondary | ICD-10-CM | POA: Diagnosis not present

## 2015-08-25 DIAGNOSIS — E1165 Type 2 diabetes mellitus with hyperglycemia: Secondary | ICD-10-CM

## 2015-08-25 DIAGNOSIS — E785 Hyperlipidemia, unspecified: Secondary | ICD-10-CM | POA: Diagnosis not present

## 2015-08-25 DIAGNOSIS — I952 Hypotension due to drugs: Secondary | ICD-10-CM

## 2015-08-25 DIAGNOSIS — IMO0001 Reserved for inherently not codable concepts without codable children: Secondary | ICD-10-CM

## 2015-08-25 LAB — BAYER DCA HB A1C WAIVED: HB A1C: 6.2 % (ref <7.0)

## 2015-08-25 MED ORDER — TADALAFIL 20 MG PO TABS
10.0000 mg | ORAL_TABLET | ORAL | Status: DC | PRN
Start: 2015-08-25 — End: 2015-12-24

## 2015-08-25 MED ORDER — MINOCYCLINE HCL 100 MG PO CAPS: 100.0000 mg | ORAL_CAPSULE | Freq: Two times a day (BID) | ORAL | Status: DC

## 2015-08-25 MED ORDER — SPIRONOLACTONE 25 MG PO TABS: 25.0000 mg | ORAL_TABLET | Freq: Every day | ORAL | Status: DC

## 2015-08-25 MED ORDER — LACTULOSE 10 GM/15ML PO SOLN: 30.0000 g | Freq: Three times a day (TID) | ORAL | Status: DC

## 2015-08-25 MED ORDER — LISINOPRIL 20 MG PO TABS: 10.0000 mg | ORAL_TABLET | Freq: Every day | ORAL | Status: DC

## 2015-08-25 NOTE — Progress Notes (Signed)
Subjective:  Patient ID: Spencer Stanley, male    DOB: 12-19-1960  Age: 55 y.o. MRN: [id number]  CC: Diabetes and Hyperlipidemia   HPI Qwanell Pelegrin presents for  follow-up of hypertension. Patient has no history of headache chest pain or shortness of breath or recent cough. Patient also denies symptoms of TIA such as numbness weakness lateralizing. Patient checks  blood pressure at home and has not had any elevated readings recently. Patient denies side effects from his medication. States taking it regularly.  Patient also  in for follow-up of elevated cholesterol. Doing well without complaints on current medication. Denies side effects of statin including myalgia and arthralgia and nausea. Also in today for liver function testing. Currently no chest pain, shortness of breath or other cardiovascular related symptoms noted.  Follow-up of diabetes. Patient does check blood sugar at home. Readings run between 120 and 170 Patient denies symptoms such as polyuria, polydipsia, excessive hunger, nausea No significant hypoglycemic spells noted. Medications as noted below. Taking them regularly without complication/adverse reaction being reported today.    History Michaeljames has a past medical history of Esophageal varices ([place]); Cirrhosis ([place]); Anxiety; Lyme disease; and [facility] spotted fever.   He has past surgical history that includes Hernia repair and Esophageal varice ligation.   His family history includes Aneurysm in his mother; Cancer in his father and mother; Dementia in his father; Diabetes in his sister; Heart failure in his father.He reports that he has been smoking.  He does not have any smokeless tobacco history on file. He reports that he does not drink alcohol or use illicit drugs.  Current Outpatient Prescriptions on File Prior to Visit  Medication Sig Dispense Refill  . albuterol (PROVENTIL HFA;VENTOLIN HFA) 108 (90 BASE) MCG/ACT inhaler Inhale 2 puffs into the lungs every 6 (six)  hours as needed for wheezing or shortness of breath. 18 g 3  . atorvastatin (LIPITOR) 40 MG tablet Take 1 tablet (40 mg total) by mouth daily. For cholesterol 90 tablet 3  . CALCIUM-MAGNESIUM-ZINC PO Take 1 capsule by mouth 2 (two) times daily.    . ciclopirox (CICLODAN) 0.77 % cream Apply topically 2 (two) times daily. 90 g 2  . FLUoxetine (PROZAC) 40 MG capsule Take 1 capsule (40 mg total) by mouth daily. 90 capsule 3  . metFORMIN (GLUCOPHAGE-XR) 750 MG 24 hr tablet TAKE 2 TABLETS EVERY DAY WITH BREAKFAST 180 tablet 1  . multivitamin (ONE-A-DAY MEN'S) TABS tablet Take 1 tablet by mouth daily.    . pantoprazole (PROTONIX) 40 MG tablet Take 1 tablet (40 mg total) by mouth daily. 90 tablet 4  . rifaximin (XIFAXAN) 550 MG TABS tablet Take 1 tablet (550 mg total) by mouth 2 (two) times daily. 60 tablet 5  . sodium hypochlorite (DAKIN'S 1/2 STRENGTH) external solution Soak feet in a weak solution  With warm water daily for 10 min. Then gently scrape the underside of the nails to remove the loose debris. (Patient not taking: Reported on 08/25/2015) 473 mL 2   No current facility-administered medications on file prior to visit.    ROS Review of Systems  Constitutional: Negative for fever, chills, diaphoresis and unexpected weight change.  HENT: Negative for congestion, hearing loss, rhinorrhea and sore throat.   Eyes: Negative for visual disturbance.  Respiratory: Negative for cough and shortness of breath.   Cardiovascular: Negative for chest pain.  Gastrointestinal: Negative for abdominal pain, diarrhea and constipation.  Genitourinary: Negative for dysuria and flank pain.  Musculoskeletal: Negative for  joint swelling and arthralgias.  Skin: Negative for rash.  Neurological: Negative for dizziness and headaches.  Psychiatric/Behavioral: Negative for sleep disturbance and dysphoric mood.    Objective:  BP 85/57 mmHg  Pulse 83  Temp(Src) 97.6 F (36.4 C) (Oral)  Ht 5\' 9"  (1.753 m)  Wt 182  lb 3.2 oz (82.645 kg)  BMI 26.89 kg/m2  SpO2 99%  BP Readings from Last 3 Encounters:  08/25/15 85/57  05/21/15 111/64  03/26/15 92/58    Wt Readings from Last 3 Encounters:  08/25/15 182 lb 3.2 oz (82.645 kg)  05/21/15 185 lb (83.915 kg)  03/26/15 182 lb 9.6 oz (82.827 kg)     Physical Exam  Constitutional: He is oriented to person, place, and time. He appears well-developed and well-nourished. No distress.  HENT:  Head: Normocephalic and atraumatic.  Right Ear: External ear normal.  Left Ear: External ear normal.  Nose: Nose normal.  Mouth/Throat: Oropharynx is clear and moist.  Eyes: Conjunctivae and EOM are normal. Pupils are equal, round, and reactive to light.  Neck: Normal range of motion. Neck supple. No thyromegaly present.  Cardiovascular: Normal rate, regular rhythm and normal heart sounds.   No murmur heard. Pulmonary/Chest: Effort normal and breath sounds normal. No respiratory distress. He has no wheezes. He has no rales.  Abdominal: Soft. Bowel sounds are normal. He exhibits no distension. There is no tenderness.  Lymphadenopathy:    He has no cervical adenopathy.  Neurological: He is alert and oriented to person, place, and time. He has normal reflexes.  Skin: Skin is warm and dry.  Psychiatric: He has a normal mood and affect. His behavior is normal. Judgment and thought content normal.    Lab Results  Component Value Date   HGBA1C 6.3 05/21/2015   HGBA1C 6.3 03/26/2015   HGBA1C 5.7 12/22/2014    Lab Results  Component Value Date   WBC CANCELED 03/26/2015   HGB 12.6* 12/12/2013   HCT CANCELED 03/26/2015   PLT CANCELED 03/26/2015   GLUCOSE 105* 08/18/2015   CHOL 105 05/21/2015   TRIG 90 05/21/2015   HDL 37* 05/21/2015   LDLCALC 50 05/21/2015   ALT 26 08/18/2015   AST 33 08/18/2015   NA 140 08/18/2015   K 4.0 08/18/2015   CL 104 08/18/2015   CREATININE 0.69* 08/18/2015   BUN 8 08/18/2015   CO2 19 08/18/2015   TSH 0.760 12/22/2014   INR  1.3* 06/18/2013   HGBA1C 6.3 05/21/2015    Patient was never admitted.  Assessment & Plan:   Powell was seen today for diabetes and hyperlipidemia.  Diagnoses and all orders for this visit:  Alcoholic cirrhosis of liver with ascites ([place]) -     lactulose (CHRONULAC) 10 GM/15ML solution; Take 45 mLs (30 g total) by mouth 3 (three) times daily. -     Ammonia; Standing  Hypotension due to drugs  Uncontrolled type 2 diabetes mellitus without complication, without long-term current use of insulin (HCC) -     Bayer DCA Hb A1c Waived  Hyperlipemia  Other orders -     lisinopril (PRINIVIL,ZESTRIL) 20 MG tablet; Take 0.5 tablets (10 mg total) by mouth daily. -     tadalafil (CIALIS) 20 MG tablet; Take 0.5-1 tablets (10-20 mg total) by mouth every other day as needed for erectile dysfunction. -     minocycline (MINOCIN) 100 MG capsule; Take 1 capsule (100 mg total) by mouth 2 (two) times daily. -     spironolactone (ALDACTONE) 25  MG tablet; Take 1 tablet (25 mg total) by mouth daily.   I have discontinued Mr. Parlapiano zolpidem. I have also changed his lisinopril. Additionally, I am having him start on tadalafil. Lastly, I am having him maintain his multivitamin, CALCIUM-MAGNESIUM-ZINC PO, FLUoxetine, atorvastatin, pantoprazole, albuterol, rifaximin, metFORMIN, sodium hypochlorite, ciclopirox, lactulose, minocycline, and spironolactone.  Meds ordered this encounter  Medications  . lactulose (CHRONULAC) 10 GM/15ML solution    Sig: Take 45 mLs (30 g total) by mouth 3 (three) times daily.    Dispense:  240 mL    Refill:  11  . lisinopril (PRINIVIL,ZESTRIL) 20 MG tablet    Sig: Take 0.5 tablets (10 mg total) by mouth daily.    Dispense:  90 tablet    Refill:  1  . tadalafil (CIALIS) 20 MG tablet    Sig: Take 0.5-1 tablets (10-20 mg total) by mouth every other day as needed for erectile dysfunction.    Dispense:  15 tablet    Refill:  4  . minocycline (MINOCIN) 100 MG capsule    Sig: Take  1 capsule (100 mg total) by mouth 2 (two) times daily.    Dispense:  180 capsule    Refill:  1  . spironolactone (ALDACTONE) 25 MG tablet    Sig: Take 1 tablet (25 mg total) by mouth daily.    Dispense:  90 tablet    Refill:  1     Follow-up: Return in about 3 months (around 11/24/2015).  Claretta Fraise, M.D.

## 2015-10-08 ENCOUNTER — Telehealth: Payer: Self-pay | Admitting: Family Medicine

## 2015-10-08 DIAGNOSIS — M545 Low back pain, unspecified: Secondary | ICD-10-CM

## 2015-11-04 DIAGNOSIS — I8390 Asymptomatic varicose veins of unspecified lower extremity: Secondary | ICD-10-CM | POA: Diagnosis not present

## 2015-11-04 DIAGNOSIS — K7030 Alcoholic cirrhosis of liver without ascites: Secondary | ICD-10-CM | POA: Diagnosis not present

## 2015-11-04 DIAGNOSIS — R609 Edema, unspecified: Secondary | ICD-10-CM | POA: Diagnosis not present

## 2015-11-04 DIAGNOSIS — K7290 Hepatic failure, unspecified without coma: Secondary | ICD-10-CM | POA: Diagnosis not present

## 2015-11-04 DIAGNOSIS — F17210 Nicotine dependence, cigarettes, uncomplicated: Secondary | ICD-10-CM | POA: Diagnosis not present

## 2015-11-04 DIAGNOSIS — E119 Type 2 diabetes mellitus without complications: Secondary | ICD-10-CM | POA: Diagnosis not present

## 2015-11-04 DIAGNOSIS — E785 Hyperlipidemia, unspecified: Secondary | ICD-10-CM | POA: Diagnosis not present

## 2015-11-04 DIAGNOSIS — Z79899 Other long term (current) drug therapy: Secondary | ICD-10-CM | POA: Diagnosis not present

## 2015-11-04 DIAGNOSIS — Z7984 Long term (current) use of oral hypoglycemic drugs: Secondary | ICD-10-CM | POA: Diagnosis not present

## 2015-11-11 DIAGNOSIS — I8510 Secondary esophageal varices without bleeding: Secondary | ICD-10-CM | POA: Diagnosis not present

## 2015-11-11 DIAGNOSIS — R748 Abnormal levels of other serum enzymes: Secondary | ICD-10-CM | POA: Diagnosis not present

## 2015-11-11 DIAGNOSIS — D649 Anemia, unspecified: Secondary | ICD-10-CM | POA: Diagnosis not present

## 2015-11-11 DIAGNOSIS — E119 Type 2 diabetes mellitus without complications: Secondary | ICD-10-CM | POA: Diagnosis not present

## 2015-11-11 DIAGNOSIS — K7030 Alcoholic cirrhosis of liver without ascites: Secondary | ICD-10-CM | POA: Diagnosis not present

## 2015-11-11 DIAGNOSIS — R112 Nausea with vomiting, unspecified: Secondary | ICD-10-CM | POA: Diagnosis not present

## 2015-11-11 DIAGNOSIS — D696 Thrombocytopenia, unspecified: Secondary | ICD-10-CM | POA: Insufficient documentation

## 2015-11-11 DIAGNOSIS — K219 Gastro-esophageal reflux disease without esophagitis: Secondary | ICD-10-CM | POA: Diagnosis not present

## 2015-11-11 DIAGNOSIS — E7220 Disorder of urea cycle metabolism, unspecified: Secondary | ICD-10-CM | POA: Insufficient documentation

## 2015-11-11 DIAGNOSIS — K7290 Hepatic failure, unspecified without coma: Secondary | ICD-10-CM | POA: Diagnosis not present

## 2015-11-11 DIAGNOSIS — E784 Other hyperlipidemia: Secondary | ICD-10-CM | POA: Diagnosis not present

## 2015-11-11 DIAGNOSIS — F329 Major depressive disorder, single episode, unspecified: Secondary | ICD-10-CM | POA: Diagnosis not present

## 2015-11-11 DIAGNOSIS — R269 Unspecified abnormalities of gait and mobility: Secondary | ICD-10-CM | POA: Diagnosis not present

## 2015-11-11 DIAGNOSIS — K8020 Calculus of gallbladder without cholecystitis without obstruction: Secondary | ICD-10-CM | POA: Diagnosis not present

## 2015-11-11 DIAGNOSIS — E785 Hyperlipidemia, unspecified: Secondary | ICD-10-CM | POA: Diagnosis not present

## 2015-11-11 DIAGNOSIS — K859 Acute pancreatitis without necrosis or infection, unspecified: Secondary | ICD-10-CM | POA: Diagnosis not present

## 2015-11-15 ENCOUNTER — Other Ambulatory Visit: Payer: Self-pay | Admitting: Family Medicine

## 2015-11-15 NOTE — Telephone Encounter (Signed)
This is a Dr.Stacks pt.

## 2015-11-17 NOTE — Telephone Encounter (Signed)
RX called into [facility]

## 2015-11-23 ENCOUNTER — Ambulatory Visit (INDEPENDENT_AMBULATORY_CARE_PROVIDER_SITE_OTHER): Payer: Commercial Managed Care - HMO | Admitting: Family Medicine

## 2015-11-23 ENCOUNTER — Encounter: Payer: Self-pay | Admitting: Family Medicine

## 2015-11-23 VITALS — BP 106/71 | HR 91 | Temp 98.4°F | Ht 69.0 in | Wt 180.8 lb

## 2015-11-23 DIAGNOSIS — K7682 Hepatic encephalopathy: Secondary | ICD-10-CM

## 2015-11-23 DIAGNOSIS — K7290 Hepatic failure, unspecified without coma: Secondary | ICD-10-CM

## 2015-11-23 NOTE — Progress Notes (Signed)
Subjective:    Patient ID: Spencer Stanley, male    DOB: [birth year], 55 y.o.   MRN: [id number]  HPI Patient is here today for a hospital follow up. He was admitted to [facility] for elevated ammonia and low BS. Patient was admitted for 2 days.    Review of Systems  Constitutional: Negative.   HENT: Negative.   Eyes: Negative.   Respiratory: Negative.   Cardiovascular: Negative.   Gastrointestinal: Negative.   Endocrine: Negative.   Genitourinary: Negative.   Musculoskeletal: Negative.   Skin: Negative.   Allergic/Immunologic: Negative.   Neurological: Negative.   Hematological: Negative.   Psychiatric/Behavioral: Negative.     Depression screen [facility] 2/9 11/23/2015 08/25/2015 05/21/2015 12/22/2014 08/24/2014  Decreased Interest 2 2 0 2 1  Down, Depressed, Hopeless 2 2 1 1 2   PHQ - 2 Score 4 4 1 3 3   Altered sleeping 1 2 - 0 2  Tired, decreased energy 2 3 - 2 2  Change in appetite 1 1 - 1 1  Feeling bad or failure about yourself  2 2 - 1 1  Trouble concentrating 1 1 - 1 1  Moving slowly or fidgety/restless 0 1 - 1 2  Suicidal thoughts 0 0 - 0 0  PHQ-9 Score 11 14 - 9 12  Difficult doing work/chores Somewhat difficult Not difficult at all - - -           Patient Active Problem List   Diagnosis Date Noted  . Type 2 diabetes mellitus without complication ([place]) [id number]  . Hyperlipemia 12/22/2014  . Cirrhosis ([place]) 09/26/2013   Outpatient Encounter Prescriptions as of 11/23/2015  Medication Sig  . albuterol (PROVENTIL HFA;VENTOLIN HFA) 108 (90 BASE) MCG/ACT inhaler Inhale 2 puffs into the lungs every 6 (six) hours as needed for wheezing or shortness of breath.  Marland Kitchen atorvastatin (LIPITOR) 40 MG tablet Take 1 tablet (40 mg total) by mouth daily. For cholesterol  . CALCIUM-MAGNESIUM-ZINC PO Take 1 capsule by mouth 2 (two) times daily.  . ciclopirox (CICLODAN) 0.77 % cream Apply topically 2 (two) times daily.  Marland Kitchen FLUoxetine (PROZAC) 40 MG capsule Take 1 capsule (40 mg total)  by mouth daily.  Marland Kitchen lactulose (CHRONULAC) 10 GM/15ML solution TAKE 45 ML (30 GM) BY MOUTH 3 (THREE) TIMES DAILY.  Marland Kitchen lisinopril (PRINIVIL,ZESTRIL) 20 MG tablet Take 0.5 tablets (10 mg total) by mouth daily.  . metFORMIN (GLUCOPHAGE-XR) 750 MG 24 hr tablet TAKE 2 TABLETS EVERY DAY WITH BREAKFAST  . minocycline (MINOCIN) 100 MG capsule Take 1 capsule (100 mg total) by mouth 2 (two) times daily.  . multivitamin (ONE-A-DAY MEN'S) TABS tablet Take 1 tablet by mouth daily.  . pantoprazole (PROTONIX) 40 MG tablet Take 1 tablet (40 mg total) by mouth daily.  . rifaximin (XIFAXAN) 550 MG TABS tablet Take 1 tablet (550 mg total) by mouth 2 (two) times daily.  Marland Kitchen spironolactone (ALDACTONE) 25 MG tablet Take 1 tablet (25 mg total) by mouth daily.  . tadalafil (CIALIS) 20 MG tablet Take 0.5-1 tablets (10-20 mg total) by mouth every other day as needed for erectile dysfunction.  . [DISCONTINUED] sodium hypochlorite (DAKIN'S 1/2 STRENGTH) external solution Soak feet in a weak solution  With warm water daily for 10 min. Then gently scrape the underside of the nails to remove the loose debris. (Patient not taking: Reported on 08/25/2015)  . [DISCONTINUED] zolpidem (AMBIEN) 10 MG tablet TAKE 1 TABLET AT BEDTIME AS NEEDED FOR SLEEP   No facility-administered encounter medications on  file as of 11/23/2015.       Objective:   Physical Exam  Constitutional: He is oriented to person, place, and time. He appears well-developed and well-nourished.  Cardiovascular: Normal rate, regular rhythm and normal heart sounds.   Pulmonary/Chest: Effort normal and breath sounds normal.  Abdominal: Soft. He exhibits no distension. There is no guarding.  Neurological: He is alert and oriented to person, place, and time.  Psychiatric: He has a normal mood and affect. His behavior is normal.     BP 106/71 mmHg  Pulse 91  Temp(Src) 98.4 F (36.9 C) (Oral)  Ht 5\' 9"  (1.753 m)  Wt 180 lb 12.8 oz (82.01 kg)  BMI 26.69 kg/m2        Assessment & Plan:  1. Encephalopathy, hepatic ([place]cer) Encouraged to take lactulose and Xifaxan regularly he only has symptoms when he neglected to take the medicine for 5 days. A new issue is gallstones will keep watch on that but if he becomes more symptomatic and needs to see surgeon .Wardell Honour MD - [place]

## 2015-11-24 ENCOUNTER — Other Ambulatory Visit: Payer: Commercial Managed Care - HMO

## 2015-11-24 DIAGNOSIS — K7031 Alcoholic cirrhosis of liver with ascites: Secondary | ICD-10-CM

## 2015-11-25 LAB — AMMONIA: AMMONIA: 217 ug/dL — AB (ref 27–102)

## 2015-11-26 DIAGNOSIS — M5136 Other intervertebral disc degeneration, lumbar region: Secondary | ICD-10-CM | POA: Diagnosis not present

## 2015-12-08 ENCOUNTER — Ambulatory Visit: Payer: Commercial Managed Care - HMO | Attending: Physical Medicine and Rehabilitation | Admitting: Physical Therapy

## 2015-12-08 DIAGNOSIS — M545 Low back pain, unspecified: Secondary | ICD-10-CM

## 2015-12-08 DIAGNOSIS — R293 Abnormal posture: Secondary | ICD-10-CM | POA: Diagnosis not present

## 2015-12-08 NOTE — Therapy (Signed)
[facility] [place], [place], [address] Phone: [phone]   Fax:  [phone]  Physical Therapy Evaluation  Patient Details  Name: Spencer Stanley MRN: [id number] Date of Birth: [birth year] Referring Provider: Suella Broad MD  Encounter Date: 12/08/2015      PT End of Session - 12/08/15 1233    Visit Number 1   Number of Visits 12   Date for PT Re-Evaluation 01/19/16   PT Start Time 1038   PT Stop Time 1128   PT Time Calculation (min) 50 min   Activity Tolerance Patient tolerated treatment well   Behavior During Therapy [facility] for tasks assessed/performed      Past Medical History:  Diagnosis Date  . Anxiety   . Cirrhosis ([place])   . Esophageal varices ([place])   . Lyme disease   . 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 ESOPHAGEAL VARICE LIGATION     TIPS  . HERNIA REPAIR      There were no vitals filed for this visit.       Subjective Assessment - 12/08/15 1235    Subjective Having a good day but somedays my pain is horrible.   Limitations --  Transitory movements.   Patient Stated Goals Get out of pain.   Currently in Pain? Yes   Pain Score 2    Pain Location Back   Pain Orientation Lower;Right;Left   Pain Descriptors / Indicators Aching   Pain Type Chronic pain   Pain Onset More than a month ago   Pain Frequency Constant   Aggravating Factors  Transitory movements.   Pain Relieving Factors Rest.            [facility] PT Assessment - 12/08/15 0001      Assessment   Medical Diagnosis Degenerative lumbar disc.   Referring Provider Suella Broad MD   Onset Date/Surgical Date --  6-8 months.     Precautions   Precautions None     Restrictions   Weight Bearing Restrictions No     Balance Screen   Has the patient fallen in the past 6 months No   Has the patient had a decrease in activity level because of a fear of falling?  No   Is the patient  reluctant to leave their home because of a fear of falling?  No     Home Ecologist residence     Prior Function   Level of Independence Independent     Posture/Postural Control   Posture/Postural Control Postural limitations   Postural Limitations Decreased lumbar lordosis     ROM / Strength   AROM / PROM / Strength AROM;Strength     AROM   Overall AROM Comments Normal intervertebral movement into active lumbar flexion and active lumbar extension= 20 degrees.     Strength   Overall Strength Comments Normal bilateral LE strength.     Palpation   Palpation comment Tender with overpresure at L5-S1 and radiation of pain on either side of this level.     Special Tests    Special Tests --  Norm LE DTR's;(-) SLR;(=)leg lengths;(-)FABER testing.     Ambulation/Gait   Gait Comments WNL.                   [facility] Adult PT Treatment/Exercise - 12/08/15 0001      Modalities   Modalities Electrical Stimulation;Moist Heat  Moist Heat Therapy   Number Minutes Moist Heat 20 Minutes   Moist Heat Location Lumbar Spine     Electrical Stimulation   Electrical Stimulation Location Lower lumbar.   Electrical Stimulation Action 80-150 Hz at 100% scan x 20 minutes.   Electrical Stimulation Goals Pain                  PT Short Term Goals - 12/08/15 1254      PT SHORT TERM GOAL #1   Title Ind with a HEP.   Time 4   Period Weeks   Status New     PT SHORT TERM GOAL #2   Title Perform ADL's with pain not > 2-3/10.   Time 4   Period Weeks   Status New     PT SHORT TERM GOAL #3   Title Perform transitory movements with pain not > 2-3/10.   Time 4   Period Weeks   Status New                  Plan - 12/08/15 1239    Clinical Impression Statement The patient reports the onset of low back pain approximately 6-8 months ago.  Today he reports pain is low at a 2/10 but he has times of extreme pain (near 10/10) for no  apparent reason.  His pain is reported across his low back.  DTR's are intact.  He demonstrates negative SLR testing.   Rehab Potential Excellent   PT Frequency 3x / week   PT Duration 4 weeks  or 12 visits.   PT Treatment/Interventions ADLs/Self Care Home Management;Electrical Stimulation;Ultrasound;Therapeutic exercise;Therapeutic activities;Patient/family education;Manual techniques   PT Next Visit Plan Electrical stimulation and moist heat; STW/M; core exercises.   Consulted and Agree with Plan of Care Patient      Patient will benefit from skilled therapeutic intervention in order to improve the following deficits and impairments:  Abnormal gait, Pain, Decreased activity tolerance  Visit Diagnosis: Midline low back pain without sciatica - Plan: PT plan of care cert/re-cert  Abnormal posture - Plan: PT plan of care cert/re-cert     Problem List Patient Active Problem List   Diagnosis Date Noted  . Type 2 diabetes mellitus without complication ([place]) [id number]  . Hyperlipemia 12/22/2014  . Cirrhosis ([place]) 09/26/2013    Zackery Brine, [place] MPT 12/08/2015, 12:57 PM  [facility] [address] [place], [place], [address] Phone: [phone]   Fax:  [phone]  Name: Spencer Stanley MRN: [id number] Date of Birth: 08/20/1960

## 2015-12-15 ENCOUNTER — Encounter: Payer: Self-pay | Admitting: Physical Therapy

## 2015-12-15 ENCOUNTER — Ambulatory Visit: Payer: Commercial Managed Care - HMO | Attending: Physical Medicine and Rehabilitation | Admitting: Physical Therapy

## 2015-12-15 DIAGNOSIS — M545 Low back pain, unspecified: Secondary | ICD-10-CM

## 2015-12-15 DIAGNOSIS — R293 Abnormal posture: Secondary | ICD-10-CM | POA: Diagnosis not present

## 2015-12-15 NOTE — Therapy (Signed)
[facility] [place], [place], [address] Phone: [phone]   Fax:  [phone]  Physical Therapy Treatment  Patient Details  Name: Spencer Stanley MRN: [id number] Date of Birth: 06-09-1960 Referring Provider: Suella Broad MD  Encounter Date: 12/15/2015      PT End of Session - 12/15/15 1436    Visit Number 2   Number of Visits 12   Date for PT Re-Evaluation 01/19/16   PT Start Time [id number]   PT Stop Time 1530   PT Time Calculation (min) 55 min   Activity Tolerance Patient tolerated treatment well   Behavior During Therapy [facility] for tasks assessed/performed      Past Medical History:  Diagnosis Date  . Anxiety   . Cirrhosis ([place])   . Esophageal varices ([place])   . Lyme disease   . 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 ESOPHAGEAL VARICE LIGATION     TIPS  . HERNIA REPAIR      There were no vitals filed for this visit.      Subjective Assessment - 12/15/15 1436    Subjective Reports that low back pain isn't bad today. The worst the pain gets is to about 8/10 per patient report. Pain varies day by day per patient report.   Patient Stated Goals Get out of pain.   Currently in Pain? Yes   Pain Score 2    Pain Location Back   Pain Orientation Lower   Pain Descriptors / Indicators Sharp  when sitting and standing    Pain Type Chronic pain   Pain Onset More than a month ago   Pain Frequency Intermittent   Aggravating Factors  Transition movements   Pain Relieving Factors Resting            [facility] PT Assessment - 12/15/15 0001      Assessment   Medical Diagnosis Degenerative lumbar disc.     Precautions   Precautions None     Restrictions   Weight Bearing Restrictions No                     [facility] Adult PT Treatment/Exercise - 12/15/15 0001      Exercises   Exercises Lumbar     Lumbar Exercises: Stretches   Active Hamstring Stretch 2  reps;30 seconds  BLE   Single Knee to Chest Stretch 2 reps;30 seconds  BLE     Lumbar Exercises: Aerobic   Stationary Bike NuStep L5 x11 min with core activation     Lumbar Exercises: Supine   Ab Set 20 reps;5 seconds   Bent Knee Raise 20 reps;3 seconds   Bridge 20 reps;5 seconds   Straight Leg Raise 10 reps;Other (comment)  BLE   Straight Leg Raises Limitations       Modalities   Modalities Electrical Stimulation;Moist Heat     Moist Heat Therapy   Number Minutes Moist Heat 15 Minutes   Moist Heat Location Lumbar Spine     Electrical Stimulation   Electrical Stimulation Location B lumbar paraspinals   Electrical Stimulation Action Pre-Mod   Electrical Stimulation Parameters 80-150 hz x15 min   Electrical Stimulation Goals Pain                  PT Short Term Goals - 12/08/15 1254      PT SHORT TERM GOAL #1   Title Ind with a HEP.   Time  4   Period Weeks   Status New     PT SHORT TERM GOAL #2   Title Perform ADL's with pain not > 2-3/10.   Time 4   Period Weeks   Status New     PT SHORT TERM GOAL #3   Title Perform transitory movements with pain not > 2-3/10.   Time 4   Period Weeks   Status New                  Plan - 12/15/15 1521    Clinical Impression Statement Patient presented in clinic with low level low back discomfort and experiences sharp pains with transition movements per patient report. Patient had no complaints with any of the stretches or core strengthening exercises completed. Patient required minimal to moderate multimodal cueing for proper exercise technique as well as proper core activation. Normal modalities response noted following removal of the modalities. Goals remain on-going as this was patient's second visit for this episode. Patient experienced low back feeling "good" following today's treatment.   Rehab Potential Excellent   PT Frequency 3x / week   PT Duration 4 weeks   PT Treatment/Interventions ADLs/Self Care  Home Management;Electrical Stimulation;Ultrasound;Therapeutic exercise;Therapeutic activities;Patient/family education;Manual techniques   PT Next Visit Plan Continue core strengthening with modalities PRN for pain control per MPT POC.   Consulted and Agree with Plan of Care Patient      Patient will benefit from skilled therapeutic intervention in order to improve the following deficits and impairments:  Abnormal gait, Pain, Decreased activity tolerance  Visit Diagnosis: Midline low back pain without sciatica  Abnormal posture     Problem List Patient Active Problem List   Diagnosis Date Noted  . Type 2 diabetes mellitus without complication ([place]) [id number]  . Hyperlipemia 12/22/2014  . Cirrhosis ([place]) 09/26/2013    Wynelle Fanny, PTA 12/15/2015, 3:38 PM  [facility] [address] [place], [place], [address] Phone: [phone]   Fax:  [phone]  Name: Spencer Stanley MRN: [id number] Date of Birth: June 16, 1960

## 2015-12-16 ENCOUNTER — Encounter: Payer: Self-pay | Admitting: Physical Therapy

## 2015-12-16 ENCOUNTER — Ambulatory Visit: Payer: Commercial Managed Care - HMO | Admitting: Physical Therapy

## 2015-12-16 DIAGNOSIS — R293 Abnormal posture: Secondary | ICD-10-CM

## 2015-12-16 DIAGNOSIS — M545 Low back pain, unspecified: Secondary | ICD-10-CM

## 2015-12-16 NOTE — Therapy (Signed)
[facility] [place], [place], [address] Phone: [phone]   Fax:  [phone]  Physical Therapy Treatment  Patient Details  Name: Spencer Stanley MRN: [id number] Date of Birth: 10/14/1960 Referring Provider: Suella Broad MD  Encounter Date: 12/16/2015      PT End of Session - 12/16/15 1510    Visit Number 3   Number of Visits 12   Date for PT Re-Evaluation 01/19/16   PT Start Time 1504  patient 53min late today   PT Stop Time 1530   PT Time Calculation (min) 26 min   Activity Tolerance Patient tolerated treatment well   Behavior During Therapy [facility] for tasks assessed/performed      Past Medical History:  Diagnosis Date  . Anxiety   . Cirrhosis ([place])   . Esophageal varices ([place])   . Lyme disease   . 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 ESOPHAGEAL VARICE LIGATION     TIPS  . HERNIA REPAIR      There were no vitals filed for this visit.      Subjective Assessment - 12/16/15 1505    Subjective Tolerated treatment well last visit per patient   Limitations --  transitional movements   Patient Stated Goals Get out of pain.   Currently in Pain? Yes   Pain Score 2    Pain Location Back   Pain Orientation Lower   Pain Descriptors / Indicators Sharp   Pain Type Chronic pain   Pain Onset More than a month ago   Pain Frequency Intermittent   Aggravating Factors  transitional movements   Pain Relieving Factors rest                         [facility] Adult PT Treatment/Exercise - 12/16/15 0001      Lumbar Exercises: Aerobic   Stationary Bike NuStep L5 x9 min with core activation, monitored and educated patient with posture focus     Moist Heat Therapy   Number Minutes Moist Heat 15 Minutes   Moist Heat Location Lumbar Spine     Electrical Stimulation   Electrical Stimulation Location B lumbar paraspinals   Electrical Stimulation Action premod   Electrical Stimulation Parameters 80-150hz    Electrical Stimulation Goals Pain                PT Education - 12/16/15 1516    Education provided Yes   Education Details HEP   Person(s) Educated Patient   Methods Explanation;Demonstration;Handout   Comprehension Verbalized understanding;Returned demonstration          PT Short Term Goals - 12/16/15 1527      PT SHORT TERM GOAL #1   Title Ind with a HEP.   Time 4   Period Weeks   Status On-going     PT SHORT TERM GOAL #2   Title Perform ADL's with pain not > 2-3/10.   Time 4   Period Weeks   Status On-going     PT SHORT TERM GOAL #3   Title Perform transitory movements with pain not > 2-3/10.   Time 4   Period Weeks   Status On-going                  Plan - 12/16/15 1517    Clinical Impression Statement Patient has reported progress thus far and understands core activation and importance of exercises to strengthen  back. Patient unable to progress with exercises due to arriving late today. HEP given today for gentle core activation. Patient unable to meet any further goals today due to pain deficits.    Rehab Potential Excellent   PT Frequency 3x / week   PT Duration 4 weeks   PT Treatment/Interventions ADLs/Self Care Home Management;Electrical Stimulation;Ultrasound;Therapeutic exercise;Therapeutic activities;Patient/family education;Manual techniques   PT Next Visit Plan Continue core strengthening with modalities PRN for pain control per MPT POC.   Consulted and Agree with Plan of Care Patient      Patient will benefit from skilled therapeutic intervention in order to improve the following deficits and impairments:  Abnormal gait, Pain, Decreased activity tolerance  Visit Diagnosis: Midline low back pain without sciatica  Abnormal posture     Problem List Patient Active Problem List   Diagnosis Date Noted  . Type 2 diabetes mellitus without complication ([place]) [id number]  . Hyperlipemia  12/22/2014  . Cirrhosis ([place]) 09/26/2013    Rhianna Raulerson P, PTA 12/16/2015, 3:30 PM  [facility] [address] [place], [place], [address] Phone: [phone]   Fax:  [phone]  Name: Spencer Stanley MRN: [id number] Date of Birth: [birth year]

## 2015-12-16 NOTE — Therapy (Signed)
[facility] [place], [place], [address] Phone: [phone]   Fax:  [phone]  Physical Therapy Treatment  Patient Details  Name: Spencer Stanley MRN: [id number] Date of Birth: 08-14-1960 Referring Provider: Suella Broad MD  Encounter Date: 12/16/2015      PT End of Session - 12/16/15 1510    Visit Number 3   Number of Visits 12   Date for PT Re-Evaluation 01/19/16   PT Start Time 1504  patient 51min late today   PT Stop Time 1530   PT Time Calculation (min) 26 min   Activity Tolerance Patient tolerated treatment well   Behavior During Therapy [facility] for tasks assessed/performed      Past Medical History:  Diagnosis Date  . Anxiety   . Cirrhosis ([place])   . Esophageal varices ([place])   . Lyme disease   . 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 ESOPHAGEAL VARICE LIGATION     TIPS  . HERNIA REPAIR      There were no vitals filed for this visit.      Subjective Assessment - 12/16/15 1505    Subjective Tolerated treatment well last visit per patient   Limitations --  transitional movements   Patient Stated Goals Get out of pain.   Currently in Pain? Yes   Pain Score 2    Pain Location Back   Pain Orientation Lower   Pain Descriptors / Indicators Sharp   Pain Type Chronic pain   Pain Onset More than a month ago   Pain Frequency Intermittent   Aggravating Factors  transitional movements   Pain Relieving Factors rest                         [facility] Adult PT Treatment/Exercise - 12/16/15 0001      Lumbar Exercises: Aerobic   Stationary Bike NuStep L5 x9 min with core activation     Moist Heat Therapy   Number Minutes Moist Heat 15 Minutes   Moist Heat Location Lumbar Spine     Electrical Stimulation   Electrical Stimulation Location B lumbar paraspinals   Electrical Stimulation Action premod   Electrical Stimulation Parameters 80-150hz    Electrical Stimulation Goals Pain                PT Education - 12/16/15 1516    Education provided Yes   Education Details HEP   Person(s) Educated Patient   Methods Explanation;Demonstration;Handout   Comprehension Verbalized understanding;Returned demonstration          PT Short Term Goals - 12/08/15 1254      PT SHORT TERM GOAL #1   Title Ind with a HEP.   Time 4   Period Weeks   Status New     PT SHORT TERM GOAL #2   Title Perform ADL's with pain not > 2-3/10.   Time 4   Period Weeks   Status New     PT SHORT TERM GOAL #3   Title Perform transitory movements with pain not > 2-3/10.   Time 4   Period Weeks   Status New                  Plan - 12/16/15 1517    Clinical Impression Statement Patient has reported progress thus far and understands core activation and importance of exercises to strengthen back. Patient unable to progress with exercises  due to arriving late today. HEP given today for gentle core activation. Patient unable to meet any further goals today due to pain deficits.    Rehab Potential Excellent   PT Frequency 3x / week   PT Duration 4 weeks   PT Treatment/Interventions ADLs/Self Care Home Management;Electrical Stimulation;Ultrasound;Therapeutic exercise;Therapeutic activities;Patient/family education;Manual techniques   PT Next Visit Plan Continue core strengthening with modalities PRN for pain control per MPT POC.   Consulted and Agree with Plan of Care Patient      Patient will benefit from skilled therapeutic intervention in order to improve the following deficits and impairments:  Abnormal gait, Pain, Decreased activity tolerance  Visit Diagnosis: Midline low back pain without sciatica  Abnormal posture     Problem List Patient Active Problem List   Diagnosis Date Noted  . Type 2 diabetes mellitus without complication ([place]) [id number]  . Hyperlipemia 12/22/2014  . Cirrhosis ([place]) 09/26/2013    Spencer Stanley,  Spencer Stanley P , PTA 12/16/2015, 3:20 PM  [facility] [place], [place], [address] Phone: [phone]   Fax:  [phone]  Name: Spencer Stanley MRN: [id number] Date of Birth: Feb 09, 1961

## 2015-12-16 NOTE — Patient Instructions (Signed)
Pelvic Tilt: Posterior - Legs Bent (Supine)   Tighten stomach and flatten back by rolling pelvis down. Hold _10___ seconds. Relax. Repeat _10-30___ times per set. Do __2__ sets per session. Do _2___ sessions per day.   Bent Leg Lift (Hook-Lying)   Tighten stomach and slowly raise right leg _5___ inches from floor. Keep trunk rigid. Hold _3___ seconds. Repeat _10___ times per set. Do ___2-3_ sets per session. Do __2__ sessions per day.    

## 2015-12-22 ENCOUNTER — Ambulatory Visit: Payer: Commercial Managed Care - HMO | Admitting: Physical Therapy

## 2015-12-22 DIAGNOSIS — M545 Low back pain, unspecified: Secondary | ICD-10-CM

## 2015-12-22 DIAGNOSIS — R293 Abnormal posture: Secondary | ICD-10-CM

## 2015-12-22 NOTE — Therapy (Signed)
[facility] [place], [place], [address] Phone: [phone]   Fax:  [phone]  Physical Therapy Treatment  Patient Details  Name: Spencer Stanley MRN: [id number] Date of Birth: 22-Jun-1960 Referring Provider: Suella Broad MD  Encounter Date: 12/22/2015      PT End of Session - 12/22/15 1457    Visit Number 4   Number of Visits 12   Date for PT Re-Evaluation 01/19/16   PT Start Time [id number]   PT Stop Time 1513   PT Time Calculation (min) 58 min   Activity Tolerance Patient tolerated treatment well   Behavior During Therapy [facility] for tasks assessed/performed      Past Medical History:  Diagnosis Date  . Anxiety   . Cirrhosis ([place])   . Esophageal varices ([place])   . Lyme disease   . 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 ESOPHAGEAL VARICE LIGATION     TIPS  . HERNIA REPAIR      There were no vitals filed for this visit.      Subjective Assessment - 12/22/15 1420    Subjective back is a little sore today; maybe a "3 or a 4"   Patient Stated Goals Get out of pain.   Currently in Pain? Yes   Pain Score 4    Pain Location Back   Pain Orientation Lower   Pain Descriptors / Indicators Sore   Pain Type Chronic pain   Pain Onset More than a month ago   Pain Frequency Intermittent   Aggravating Factors  transitional movements   Pain Relieving Factors rest                         [facility] Adult PT Treatment/Exercise - 12/22/15 1421      Lumbar Exercises: Stretches   Single Knee to Chest Stretch 3 reps;30 seconds   Single Knee to Chest Stretch Limitations bil   Double Knee to Chest Stretch 3 reps;30 seconds   Lower Trunk Rotation 3 reps;30 seconds   Lower Trunk Rotation Limitations bil     Lumbar Exercises: Aerobic   Stationary Bike NuStep L5 x 10 min with core activation, monitored and educated patient with posture focus     Lumbar Exercises: Supine   Ab Set 20 reps;5 seconds   AB Set Limitations mod cues for technique   Bent Knee Raise 10 reps   Bent Knee Raise Limitations min cues for technique     Moist Heat Therapy   Number Minutes Moist Heat 15 Minutes   Moist Heat Location Lumbar Spine     Electrical Stimulation   Electrical Stimulation Location B lumbar paraspinals   Electrical Stimulation Action IFC   Electrical Stimulation Parameters to tolerance x15 min   Electrical Stimulation Goals Pain                  PT Short Term Goals - 12/16/15 1527      PT SHORT TERM GOAL #1   Title Ind with a HEP.   Time 4   Period Weeks   Status On-going     PT SHORT TERM GOAL #2   Title Perform ADL's with pain not > 2-3/10.   Time 4   Period Weeks   Status On-going     PT SHORT TERM GOAL #3   Title Perform transitory movements with pain not > 2-3/10.   Time 4  Period Weeks   Status On-going                  Plan - 12/22/15 1458    Clinical Impression Statement Pt tolerated exercises well needing mod cues for core stabilization exercises.  Will continue to benefit from PT to maximize function and maximize goals.   PT Treatment/Interventions ADLs/Self Care Home Management;Electrical Stimulation;Ultrasound;Therapeutic exercise;Therapeutic activities;Patient/family education;Manual techniques   PT Next Visit Plan Continue core strengthening with modalities PRN for pain control per MPT POC.   Consulted and Agree with Plan of Care Patient      Patient will benefit from skilled therapeutic intervention in order to improve the following deficits and impairments:  Abnormal gait, Pain, Decreased activity tolerance  Visit Diagnosis: Midline low back pain without sciatica  Abnormal posture     Problem List Patient Active Problem List   Diagnosis Date Noted  . Type 2 diabetes mellitus without complication ([place]) [id number]  . Hyperlipemia 12/22/2014  . Cirrhosis ([place]) 09/26/2013      Laureen Abrahams, PT, DPT 12/22/15 3:00 PM   [facility] [place], [place], [address] Phone: [phone]   Fax:  [phone]  Name: Jossiel Gotthardt MRN: [id number] Date of Birth: [birth year]

## 2015-12-23 ENCOUNTER — Ambulatory Visit: Payer: Commercial Managed Care - HMO | Admitting: Physical Therapy

## 2015-12-23 ENCOUNTER — Telehealth: Payer: Self-pay | Admitting: Family Medicine

## 2015-12-23 DIAGNOSIS — M545 Low back pain, unspecified: Secondary | ICD-10-CM

## 2015-12-23 DIAGNOSIS — R293 Abnormal posture: Secondary | ICD-10-CM

## 2015-12-23 NOTE — Patient Instructions (Signed)
Knee to Chest (Flexion)    Pull knee toward chest. Feel stretch in lower back or buttock area. Breathing deeply, Hold __30__ seconds. Repeat with other knee. Repeat _3___ times. Do __2__ sessions per day.  http://gt2.exer.us/225   Copyright  VHI. All rights reserved.    Double Knee to Chest (Flexion)   Gently pull both knees toward chest. Feel stretch in lower back or buttock area. Breathing deeply, Hold ___30_ seconds. Repeat __3__ times. Do _2___ sessions per day.  http://gt2.exer.us/227   Copyright  VHI. All rights reserved.    Piriformis (Supine)   Cross legs, right on top. Gently pull other knee toward chest until stretch is felt in buttock/hip of top leg. Hold _30___ seconds. Repeat ___3_ times per set.  Do __2__ sessions per day.  http://orth.exer.us/676   Copyright  VHI. All rights reserved.     Lower Trunk Rotation Stretch   Keeping back flat and feet together, rotate knees to left side. Hold ___30_ seconds. Repeat __3_ times per set. . Do __2__ sessions per day.  http://orth.exer.us/122   Copyright  VHI. All rights reserved.

## 2015-12-23 NOTE — Therapy (Signed)
[facility] [place], [place], [address] Phone: [phone]   Fax:  [phone]  Physical Therapy Treatment  Patient Details  Name: Spencer Stanley MRN: [id number] Date of Birth: November 14, 1960 Referring Provider: Suella Broad MD  Encounter Date: 12/23/2015      PT End of Session - 12/23/15 1457    Visit Number 5   Number of Visits 12   Date for PT Re-Evaluation 01/19/16   PT Start Time [id number]   PT Stop Time 1512   PT Time Calculation (min) 56 min   Activity Tolerance Patient tolerated treatment well   Behavior During Therapy [facility] for tasks assessed/performed      Past Medical History:  Diagnosis Date  . Anxiety   . Cirrhosis ([place])   . Esophageal varices ([place])   . Lyme disease   . 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 ESOPHAGEAL VARICE LIGATION     TIPS  . HERNIA REPAIR      There were no vitals filed for this visit.      Subjective Assessment - 12/23/15 1421    Subjective back is the same as yesterday; "about a 4."  reports he did exercises last night.   Patient Stated Goals Get out of pain.   Currently in Pain? Yes   Pain Score 4    Pain Location Back   Pain Orientation Lower   Pain Descriptors / Indicators Sore   Pain Type Chronic pain   Pain Onset More than a month ago   Pain Frequency Intermittent   Aggravating Factors  transitional movements   Pain Relieving Factors rest                         [facility] Adult PT Treatment/Exercise - 12/23/15 1422      Lumbar Exercises: Stretches   Single Knee to Chest Stretch 3 reps;30 seconds   Single Knee to Chest Stretch Limitations bil   Double Knee to Chest Stretch 3 reps;30 seconds   Lower Trunk Rotation 3 reps;30 seconds   Lower Trunk Rotation Limitations bil   Piriformis Stretch 3 reps;30 seconds   Piriformis Stretch Limitations bil     Lumbar Exercises: Aerobic   Stationary Bike NuStep L6 x  15 min     Lumbar Exercises: Supine   Bridge 20 reps;5 seconds     Moist Heat Therapy   Number Minutes Moist Heat 15 Minutes   Moist Heat Location Lumbar Spine     Electrical Stimulation   Electrical Stimulation Location B lumbar paraspinals   Electrical Stimulation Action IFC   Electrical Stimulation Parameters to tolerance x 15 min   Electrical Stimulation Goals Pain                PT Education - 12/23/15 1457    Education provided Yes   Education Details HEP   Person(s) Educated Patient   Methods Explanation;Demonstration;Handout   Comprehension Verbalized understanding;Returned demonstration;Need further instruction          PT Short Term Goals - 12/16/15 1527      PT SHORT TERM GOAL #1   Title Ind with a HEP.   Time 4   Period Weeks   Status On-going     PT SHORT TERM GOAL #2   Title Perform ADL's with pain not > 2-3/10.   Time 4   Period Weeks   Status On-going  PT SHORT TERM GOAL #3   Title Perform transitory movements with pain not > 2-3/10.   Time 4   Period Weeks   Status On-going                  Plan - 12/23/15 1535    Clinical Impression Statement Pt returns to PT after session yesterday.  Reports no significant change from last session but only seen 24 hours prior.  Issued HEP for lumbar flexibility.  Will continue to benefit from PT to maximize function.   PT Treatment/Interventions ADLs/Self Care Home Management;Electrical Stimulation;Ultrasound;Therapeutic exercise;Therapeutic activities;Patient/family education;Manual techniques   PT Next Visit Plan Continue core strengthening with modalities PRN for pain control per MPT POC.   Consulted and Agree with Plan of Care Patient      Patient will benefit from skilled therapeutic intervention in order to improve the following deficits and impairments:  Abnormal gait, Pain, Decreased activity tolerance  Visit Diagnosis: Midline low back pain without sciatica  Abnormal  posture     Problem List Patient Active Problem List   Diagnosis Date Noted  . Type 2 diabetes mellitus without complication ([place]) [id number]  . Hyperlipemia 12/22/2014  . Cirrhosis ([place]) 09/26/2013       Laureen Abrahams, PT, DPT 12/23/15 3:37 PM    [facility] [place], [place], [address] Phone: [phone]   Fax:  [phone]  Name: Spencer Stanley MRN: [id number] Date of Birth: 12-30-1960

## 2015-12-24 MED ORDER — TADALAFIL 20 MG PO TABS: 10.0000 mg | ORAL_TABLET | ORAL | 4 refills | Status: DC | PRN

## 2015-12-24 NOTE — Telephone Encounter (Signed)
[facility] would not sent cialis since it was not a 90 day supply so patient requested that it be sent to [facility]. Rx sent to [facility].

## 2015-12-28 ENCOUNTER — Encounter: Payer: Self-pay | Admitting: Physical Therapy

## 2015-12-28 ENCOUNTER — Ambulatory Visit: Payer: Commercial Managed Care - HMO | Admitting: Physical Therapy

## 2015-12-28 DIAGNOSIS — M545 Low back pain, unspecified: Secondary | ICD-10-CM

## 2015-12-28 DIAGNOSIS — R293 Abnormal posture: Secondary | ICD-10-CM

## 2015-12-28 NOTE — Therapy (Signed)
[facility] [place], [place], [address] Phone: [phone]   Fax:  [phone]  Physical Therapy Treatment  Patient Details  Name: Spencer Stanley MRN: [id number] Date of Birth: Apr 12, 1961 Referring Provider: Suella Broad MD  Encounter Date: 12/28/2015      PT End of Session - 12/28/15 1530    Visit Number 6   Number of Visits 12   Date for PT Re-Evaluation 01/19/16   PT Start Time 1518   PT Stop Time 1601   PT Time Calculation (min) 43 min   Activity Tolerance Patient tolerated treatment well   Behavior During Therapy [facility] for tasks assessed/performed      Past Medical History:  Diagnosis Date  . Anxiety   . Cirrhosis ([place])   . Esophageal varices ([place])   . Lyme disease   . 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 ESOPHAGEAL VARICE LIGATION     TIPS  . HERNIA REPAIR      There were no vitals filed for this visit.      Subjective Assessment - 12/28/15 1529    Subjective Reports that he thinks that the rain may be messing with him. Reports that when he got off the couch he had shooting pain in R low back.   Patient Stated Goals Get out of pain.   Currently in Pain? Yes   Pain Score 5    Pain Location Back   Pain Orientation Lower   Pain Descriptors / Indicators Shooting;Aching   Pain Type Chronic pain   Pain Onset More than a month ago            [facility] PT Assessment - 12/28/15 0001      Assessment   Medical Diagnosis Degenerative lumbar disc.     Precautions   Precautions None     Restrictions   Weight Bearing Restrictions No                     [facility] Adult PT Treatment/Exercise - 12/28/15 0001      Lumbar Exercises: Stretches   Single Knee to Chest Stretch 3 reps;30 seconds   Single Knee to Chest Stretch Limitations BLE   Double Knee to Chest Stretch 3 reps;30 seconds   Lower Trunk Rotation 3 reps;30 seconds   Lower Trunk  Rotation Limitations BLE     Lumbar Exercises: Aerobic   Stationary Bike NuStep L5 x15 min     Lumbar Exercises: Supine   Bent Knee Raise 20 reps   Bridge 20 reps;5 seconds   Straight Leg Raise 20 reps;3 seconds   Straight Leg Raises Limitations BLE                  PT Short Term Goals - 12/16/15 1527      PT SHORT TERM GOAL #1   Title Ind with a HEP.   Time 4   Period Weeks   Status On-going     PT SHORT TERM GOAL #2   Title Perform ADL's with pain not > 2-3/10.   Time 4   Period Weeks   Status On-going     PT SHORT TERM GOAL #3   Title Perform transitory movements with pain not > 2-3/10.   Time 4   Period Weeks   Status On-going                  Plan - 12/28/15 1605  Clinical Impression Statement Patient arrived to treatment with increased low back ache and an incident of shooting pain today. Patient able to complete all stretches and core strengthening exercises well and as directed following minimal mutlimodal cueing for proper exercise technique. Patient denied modaliites today and did not report any pain or discomfort following today's treatment.   Rehab Potential Excellent   PT Frequency 3x / week   PT Duration 4 weeks   PT Treatment/Interventions ADLs/Self Care Home Management;Electrical Stimulation;Ultrasound;Therapeutic exercise;Therapeutic activities;Patient/family education;Manual techniques   PT Next Visit Plan Continue core strengthening with modalities PRN for pain control per MPT POC.   Consulted and Agree with Plan of Care Patient      Patient will benefit from skilled therapeutic intervention in order to improve the following deficits and impairments:  Abnormal gait, Pain, Decreased activity tolerance  Visit Diagnosis: Midline low back pain without sciatica  Abnormal posture     Problem List Patient Active Problem List   Diagnosis Date Noted  . Type 2 diabetes mellitus without complication ([place]) [id number]  .  Hyperlipemia 12/22/2014  . Cirrhosis ([place]) 09/26/2013    Wynelle Fanny, PTA 12/28/2015, 4:08 PM  [facility] [address] [place], [place], [address] Phone: [phone]   Fax:  [phone]  Name: Everest Wigfall MRN: [id number] Date of Birth: 1960-08-19

## 2015-12-30 ENCOUNTER — Ambulatory Visit: Payer: Commercial Managed Care - HMO | Admitting: *Deleted

## 2015-12-30 DIAGNOSIS — R293 Abnormal posture: Secondary | ICD-10-CM

## 2015-12-30 DIAGNOSIS — M545 Low back pain, unspecified: Secondary | ICD-10-CM

## 2015-12-30 NOTE — Therapy (Signed)
[facility] [place], [place], [address] Phone: [phone]   Fax:  [phone]  Physical Therapy Treatment  Patient Details  Name: Spencer Stanley MRN: [id number] Date of Birth: Jan 25, 1961 Referring Provider: Suella Broad MD  Encounter Date: 12/30/2015      PT End of Session - 12/30/15 1528    Visit Number 7   Number of Visits 12   Date for PT Re-Evaluation 01/19/16   PT Start Time [id number]   PT Stop Time 1605   PT Time Calculation (min) 50 min      Past Medical History:  Diagnosis Date  . Anxiety   . Cirrhosis ([place])   . Esophageal varices ([place])   . Lyme disease   . 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 ESOPHAGEAL VARICE LIGATION     TIPS  . HERNIA REPAIR      There were no vitals filed for this visit.      Subjective Assessment - 12/30/15 1527    Subjective Reports that he thinks that the rain may be messing with him. Reports that when he got off the couch he had shooting pain in R low back. 3/10 today   Patient Stated Goals Get out of pain.   Currently in Pain? Yes   Pain Score 3    Pain Location Back   Pain Orientation Lower   Pain Descriptors / Indicators Aching;Sharp   Pain Type Chronic pain   Pain Onset More than a month ago   Pain Frequency Intermittent                         [facility] Adult PT Treatment/Exercise - 12/30/15 0001      Lumbar Exercises: Aerobic   Stationary Bike NuStep L5 x16 min     Lumbar Exercises: Supine   Ab Set 20 reps;5 seconds   AB Set Limitations mod cues for technique breathe normally   Bent Knee Raise 20 reps   Bent Knee Raise Limitations min cues for technique   Bridge 20 reps;5 seconds   Straight Leg Raise 20 reps;3 seconds   Straight Leg Raises Limitations BLE     Lumbar Exercises: Prone   Straight Leg Raise 20 reps;3 seconds  with Drawin     Moist Heat Therapy   Moist Heat Location Lumbar Spine      Electrical Stimulation   Electrical Stimulation Location B lumbar paraspinals   Electrical Stimulation Action IFC   Electrical Stimulation Parameters 80-150 hz x 15 mins    Electrical Stimulation Goals Pain                  PT Short Term Goals - 12/16/15 1527      PT SHORT TERM GOAL #1   Title Ind with a HEP.   Time 4   Period Weeks   Status On-going     PT SHORT TERM GOAL #2   Title Perform ADL's with pain not > 2-3/10.   Time 4   Period Weeks   Status On-going     PT SHORT TERM GOAL #3   Title Perform transitory movements with pain not > 2-3/10.   Time 4   Period Weeks   Status On-going                  Plan - 12/30/15 1529    Clinical Impression Statement Pt did fairly well  with Rx today. He was able to perform all Therex and core Exs with only minimal pain increase. We added prone leg raise to therex today and was he was able to perform , but was challenging. Mild instability still noticed during core exs. Normal modality response after removing. Goals are ongoing   Rehab Potential Excellent   PT Frequency 3x / week   PT Duration 4 weeks   PT Treatment/Interventions ADLs/Self Care Home Management;Electrical Stimulation;Ultrasound;Therapeutic exercise;Therapeutic activities;Patient/family education;Manual techniques   PT Next Visit Plan Continue core strengthening with modalities PRN for pain control per MPT POC.   Consulted and Agree with Plan of Care Patient      Patient will benefit from skilled therapeutic intervention in order to improve the following deficits and impairments:  Abnormal gait, Pain, Decreased activity tolerance  Visit Diagnosis: Midline low back pain without sciatica  Abnormal posture     Problem List Patient Active Problem List   Diagnosis Date Noted  . Type 2 diabetes mellitus without complication ([place]) [id number]  . Hyperlipemia 12/22/2014  . Cirrhosis ([place]) 09/26/2013    Spencer Stanley,CHRIS, PTA 12/30/2015,  6:17 PM  [facility] [place], [place], [address] Phone: [phone]   Fax:  [phone]  Name: Spencer Stanley MRN: [id number] Date of Birth: Oct 12, 1960

## 2016-01-04 ENCOUNTER — Ambulatory Visit: Payer: Commercial Managed Care - HMO | Admitting: Physical Therapy

## 2016-01-04 DIAGNOSIS — R293 Abnormal posture: Secondary | ICD-10-CM | POA: Diagnosis not present

## 2016-01-04 DIAGNOSIS — M545 Low back pain, unspecified: Secondary | ICD-10-CM

## 2016-01-04 NOTE — Therapy (Signed)
[facility] [place], [place], [address] Phone: [phone]   Fax:  [phone]  Physical Therapy Treatment  Patient Details  Name: Spencer Stanley MRN: [id number] Date of Birth: 1961-05-05 Referring Provider: Suella Broad MD  Encounter Date: 01/04/2016      PT End of Session - 01/04/16 1352    Visit Number 8   Number of Visits 12   Date for PT Re-Evaluation 01/19/16   PT Start Time 1351   PT Stop Time 1445   PT Time Calculation (min) 54 min   Activity Tolerance Patient tolerated treatment well   Behavior During Therapy [facility] for tasks assessed/performed      Past Medical History:  Diagnosis Date  . Anxiety   . Cirrhosis ([place])   . Esophageal varices ([place])   . Lyme disease   . 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 ESOPHAGEAL VARICE LIGATION     TIPS  . HERNIA REPAIR      There were no vitals filed for this visit.      Subjective Assessment - 01/04/16 1352    Subjective Patient reports increased LBP today.   Patient Stated Goals Get out of pain.   Currently in Pain? Yes   Pain Score 5    Pain Location Back   Pain Orientation Lower   Pain Descriptors / Indicators Aching;Sharp   Pain Type Chronic pain   Pain Onset More than a month ago                         [facility] Adult PT Treatment/Exercise - 01/04/16 0001      Posture/Postural Control   Posture Comments Worked on correct sitting posture     Lumbar Exercises: Stretches   Quadruped Mid Back Stretch 3 reps;20 seconds  middle and both sides   Quad Stretch 2 reps;30 seconds   Quad Stretch Limitations attempted HF stretch, but no stretch noted     Lumbar Exercises: Aerobic   Stationary Bike NuStep L5 x15 min     Lumbar Exercises: Prone   Straight Leg Raise 20 reps;3 seconds  Bil     Lumbar Exercises: Quadruped   [place]/Old Horse 5 reps   [place]/Old Horse Limitations requires VCs and  some TCs to perform correctly     Moist Heat Therapy   Moist Heat Location Lumbar Spine     Electrical Stimulation   Electrical Stimulation Location B lumbar paraspinals   Electrical Stimulation Action IFC    Electrical Stimulation Parameters 80-150 Hz to tolerance x 15 min   Electrical Stimulation Goals Pain                PT Education - 01/04/16 1434    Education provided Yes   Education Details HEP and postural ed including avoiding soft couch   Person(s) Educated Patient   Methods Explanation;Demonstration;Handout   Comprehension Verbalized understanding;Returned demonstration          PT Short Term Goals - 12/16/15 1527      PT SHORT TERM GOAL #1   Title Ind with a HEP.   Time 4   Period Weeks   Status On-going     PT SHORT TERM GOAL #2   Title Perform ADL's with pain not > 2-3/10.   Time 4   Period Weeks   Status On-going     PT SHORT TERM GOAL #3   Title  Perform transitory movements with pain not > 2-3/10.   Time 4   Period Weeks   Status On-going                  Plan - 01/04/16 1435    Clinical Impression Statement Patient did well today with treatment. He responded well to paraspinal stretches and cat/cow. Patient reports he spends a lot of time sitting on the soft couch including to eat. PT encouraged straight back chair as much as possible. Goals are ongoing.   Rehab Potential Excellent   PT Frequency 3x / week   PT Duration 4 weeks   PT Treatment/Interventions ADLs/Self Care Home Management;Electrical Stimulation;Ultrasound;Therapeutic exercise;Therapeutic activities;Patient/family education;Manual techniques   PT Next Visit Plan Continue core strengthening with modalities PRN for pain control per MPT POC.   Consulted and Agree with Plan of Care Patient      Patient will benefit from skilled therapeutic intervention in order to improve the following deficits and impairments:  Abnormal gait, Pain, Decreased activity  tolerance  Visit Diagnosis: Midline low back pain without sciatica  Abnormal posture     Problem List Patient Active Problem List   Diagnosis Date Noted  . Type 2 diabetes mellitus without complication ([place]) [id number]  . Hyperlipemia 12/22/2014  . Cirrhosis ([place]) 09/26/2013    Madelyn Flavors PT 01/04/2016, 2:41 PM  [facility] [address] [place], [place], [address] Phone: [phone]   Fax:  [phone]  Name: Spencer Stanley MRN: [id number] Date of Birth: 09/13/1960

## 2016-01-04 NOTE — Patient Instructions (Signed)
Angry Cat, All Fours   Kneel on hands and knees. Tuck chin and tighten stomach. Exhale and round back upward. Inhale and arch back downward. Hold each position 3-5 seconds. Repeat _10-20 times per session. Do 2 sessions per day.  Copyright  VHI. All rights reserved.     Flexion   Sitting on knees, fold body over legs and relax head and arms on floor. Extend arms as far forward as possible. Hold 20-30 seconds. Repeat reaching to the right with both hands and then to the left. Do 2 sessions per day.   Quadriceps (Prone)   On stomach with sheet around ankles, knees together, hips down, pull heels toward bottom. Keep hips flat. Hold __30__ seconds. Repeat _3__ times. Do __3__ sessions per day. CAUTION: Stretch should be gentle, steady and slow.  Madelyn Flavors, PT 01/04/16 2:34 PM [facility] [address] [place], [place], [address] Phone: [phone]   Fax:  [phone]

## 2016-01-05 ENCOUNTER — Encounter: Payer: Self-pay | Admitting: Physical Therapy

## 2016-01-05 ENCOUNTER — Ambulatory Visit: Payer: Commercial Managed Care - HMO | Admitting: Physical Therapy

## 2016-01-05 DIAGNOSIS — M545 Low back pain, unspecified: Secondary | ICD-10-CM

## 2016-01-05 DIAGNOSIS — R293 Abnormal posture: Secondary | ICD-10-CM

## 2016-01-05 NOTE — Therapy (Signed)
[facility] [place], [place], [address] Phone: [phone]   Fax:  [phone]  Physical Therapy Treatment  Patient Details  Name: Spencer Stanley MRN: [id number] Date of Birth: January 29, 1961 Referring Provider: Suella Broad MD  Encounter Date: 01/05/2016      PT End of Session - 01/05/16 1503    Visit Number 9   Number of Visits 12   Date for PT Re-Evaluation 01/19/16   PT Start Time 1440   PT Stop Time 1524   PT Time Calculation (min) 44 min   Activity Tolerance Patient tolerated treatment well   Behavior During Therapy [facility] for tasks assessed/performed      Past Medical History:  Diagnosis Date  . Anxiety   . Cirrhosis ([place])   . Esophageal varices ([place])   . Lyme disease   . 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 ESOPHAGEAL VARICE LIGATION     TIPS  . HERNIA REPAIR      There were no vitals filed for this visit.      Subjective Assessment - 01/05/16 1445    Subjective Patient reported less pain today than he had yesterday   Patient Stated Goals Get out of pain.   Currently in Pain? Yes   Pain Score 2    Pain Location Back   Pain Orientation Lower   Pain Descriptors / Indicators Aching   Pain Type Chronic pain   Pain Onset More than a month ago   Pain Frequency Intermittent   Aggravating Factors  transitional movements   Pain Relieving Factors at rest                         [facility] Adult PT Treatment/Exercise - 01/05/16 0001      Lumbar Exercises: Aerobic   Stationary Bike NuStep L5 x15 min, posture focus, monitored for progression     Lumbar Exercises: Supine   Ab Set 5 reps  10sec holds   Bent Knee Raise 3 seconds  2x10   Bridge 3 seconds  2x10   Straight Leg Raise 3 seconds  2x10   Other Supine Lumbar Exercises seated for scap retractions with draw ins with red t-band 2x10   Other Supine Lumbar Exercises 2# reachouts and D1/D2  with core activation 2x10 each     Moist Heat Therapy   Number Minutes Moist Heat 15 Minutes   Moist Heat Location Lumbar Spine     Electrical Stimulation   Electrical Stimulation Location B lumbar paraspinals   Electrical Stimulation Action IFC   Electrical Stimulation Parameters 80-'150hz'    Electrical Stimulation Goals Pain                PT Education - 01/04/16 1434    Education provided Yes   Education Details HEP and postural ed including avoiding soft couch   Person(s) Educated Patient   Methods Explanation;Demonstration;Handout   Comprehension Verbalized understanding;Returned demonstration          PT Short Term Goals - 01/05/16 1504      PT SHORT TERM GOAL #1   Title Ind with a HEP.   Time 4   Period Weeks   Status Achieved     PT SHORT TERM GOAL #2   Title Perform ADL's with pain not > 2-3/10.   Time 4   Period Weeks   Status On-going     PT SHORT TERM GOAL #  3   Title Perform transitory movements with pain not > 2-3/10.   Time 4   Period Weeks   Status On-going                  Plan - 01/05/16 1505    Clinical Impression Statement Patient progressing with all activities today with reports of less pain today and overall. Patient tolerated treatment well with no pain reports and able to progress with core strenthening exercises to stabilize back. Patient required cues throughout for core activation and posture focus. Patient doing well with inital HEP. Met STG #1 and others ongoing due to pain deficits.   Rehab Potential Excellent   PT Frequency 3x / week   PT Duration 4 weeks   PT Treatment/Interventions ADLs/Self Care Home Management;Electrical Stimulation;Ultrasound;Therapeutic exercise;Therapeutic activities;Patient/family education;Manual techniques   PT Next Visit Plan Continue core strengthening with modalities PRN for pain control per MPT POC.   Consulted and Agree with Plan of Care Patient      Patient will benefit from  skilled therapeutic intervention in order to improve the following deficits and impairments:  Abnormal gait, Pain, Decreased activity tolerance  Visit Diagnosis: Abnormal posture  Midline low back pain without sciatica     Problem List Patient Active Problem List   Diagnosis Date Noted  . Type 2 diabetes mellitus without complication ([place]) 16/02/9603  . Hyperlipemia 12/22/2014  . Cirrhosis ([place]) 09/26/2013    Marykathryn Carboni P, PTA 01/05/2016, 3:25 PM  [facility] [place], [place], [address] Phone: [phone]   Fax:  [phone]  Name: Spencer Stanley MRN: [id number] Date of Birth: [birth year]

## 2016-01-11 ENCOUNTER — Ambulatory Visit: Payer: Commercial Managed Care - HMO | Admitting: Physical Therapy

## 2016-01-11 DIAGNOSIS — M545 Low back pain, unspecified: Secondary | ICD-10-CM

## 2016-01-11 DIAGNOSIS — R293 Abnormal posture: Secondary | ICD-10-CM | POA: Diagnosis not present

## 2016-01-11 NOTE — Therapy (Signed)
[facility] [place], [place], [address] Phone: [phone]   Fax:  [phone]  Physical Therapy Treatment  Patient Details  Name: Spencer Stanley MRN: [id number] Date of Birth: Aug 16, 1960 Referring Provider: Suella Broad MD  Encounter Date: 01/11/2016      PT End of Session - 01/11/16 1436    Visit Number 10   Number of Visits 12   Date for PT Re-Evaluation 01/19/16   PT Start Time 5643   PT Stop Time 1515   PT Time Calculation (min) 39 min   Activity Tolerance Patient tolerated treatment well   Behavior During Therapy [facility] for tasks assessed/performed      Past Medical History:  Diagnosis Date  . Anxiety   . Cirrhosis ([place])   . Esophageal varices ([place])   . Lyme disease   . 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 ESOPHAGEAL VARICE LIGATION     TIPS  . HERNIA REPAIR      There were no vitals filed for this visit.      Subjective Assessment - 01/11/16 1436    Subjective Patient states his back is doing pretty well today. He reports 10% improvement overall. Moving from sitting to standing is still the worst.   Patient Stated Goals Get out of pain.   Currently in Pain? Yes   Pain Score 2    Pain Location Back   Pain Orientation Lower   Pain Descriptors / Indicators Aching   Pain Type Chronic pain   Pain Onset More than a month ago   Pain Frequency Intermittent                         [facility] Adult PT Treatment/Exercise - 01/11/16 0001      Lumbar Exercises: Aerobic   Stationary Bike NuStep L5 x15 min, posture focus, monitored for progression     Lumbar Exercises: Standing   Other Standing Lumbar Exercises figure 8s x 10 with 4# ball   Other Standing Lumbar Exercises  diagonal chops x 10 ea with 4# ball     Lumbar Exercises: Supine   Bent Knee Raise 20 reps  with ab set   Bridge 20 reps  4 x 5 with clamshell   Straight Leg Raise 20  reps;3 seconds   Other Supine Lumbar Exercises standing for scap retractions with draw ins with red t-band 2x10     Moist Heat Therapy   Number Minutes Moist Heat --   Moist Heat Location --     Acupuncturist Stimulation Location --   Printmaker Action --   Electrical Stimulation Parameters --   Printmaker Goals --                  PT Short Term Goals - 01/11/16 1444      PT SHORT TERM GOAL #1   Title Ind with a HEP.   Time 4   Period Weeks   Status Achieved     PT SHORT TERM GOAL #2   Title Perform ADL's with pain not > 2-3/10.   Baseline avg 3/10 with ADLS   Time 4   Period Weeks   Status Achieved     PT SHORT TERM GOAL #3   Title Perform transitory movements with pain not > 2-3/10.   Baseline still gets up to 10/10 with sit to stand.  Time 4   Status On-going                  Plan - 01/13/2016 1525    Clinical Impression Statement Patient is progressing with goals. He has met his pain goal for ADLs, but still reports significant pain with sit to stand. He states he has tried to sit more in straight backed chairs and says he is compliant with HEP. He tolerated higher level core today without complaint. Estim was held as he had appointment for his truck. Remaining goals are ongoing.   Rehab Potential Excellent   PT Frequency 3x / week   PT Duration 4 weeks   PT Treatment/Interventions ADLs/Self Care Home Management;Electrical Stimulation;Ultrasound;Therapeutic exercise;Therapeutic activities;Patient/family education;Manual techniques   PT Next Visit Plan Continue core strengthening with modalities PRN for pain control per MPT POC.   Consulted and Agree with Plan of Care Patient      Patient will benefit from skilled therapeutic intervention in order to improve the following deficits and impairments:  Abnormal gait, Pain, Decreased activity tolerance  Visit Diagnosis: Midline low back pain without  sciatica  Abnormal posture       G-Codes - 01-13-2016 1442    Functional Assessment Tool Used Clinical judgement...   Functional Limitation Mobility: Walking and moving around   Mobility: Walking and Moving Around Current Status [phone]) At least 20 percent but less than 40 percent impaired, limited or restricted   Mobility: Walking and Moving Around Goal Status [id number]) At least 1 percent but less than 20 percent impaired, limited or restricted      Problem List Patient Active Problem List   Diagnosis Date Noted  . Type 2 diabetes mellitus without complication ([place]) 33/54/5625  . Hyperlipemia 12/22/2014  . Cirrhosis ([place]) 09/26/2013    Madelyn Flavors PT 2016-01-13, 3:28 PM  [facility] [address] [place], [place], [address] Phone: [phone]   Fax:  [phone]  Name: Spencer Stanley MRN: [id number] Date of Birth: Feb 25, 1961

## 2016-01-13 ENCOUNTER — Ambulatory Visit: Payer: Commercial Managed Care - HMO | Admitting: *Deleted

## 2016-01-13 DIAGNOSIS — M545 Low back pain, unspecified: Secondary | ICD-10-CM

## 2016-01-13 DIAGNOSIS — R293 Abnormal posture: Secondary | ICD-10-CM | POA: Diagnosis not present

## 2016-01-13 NOTE — Therapy (Signed)
[facility] [place], [place], [address] Phone: [phone]   Fax:  [phone]  Physical Therapy Treatment  Patient Details  Name: Spencer Stanley MRN: [id number] Date of Birth: June 11, 1960 Referring Provider: Suella Broad MD  Encounter Date: 01/13/2016      PT End of Session - 01/13/16 1439    Visit Number 11   Number of Visits 12   Date for PT Re-Evaluation 01/19/16   PT Start Time [id number]   PT Stop Time 1520   PT Time Calculation (min) 45 min      Past Medical History:  Diagnosis Date  . Anxiety   . Cirrhosis ([place])   . Esophageal varices ([place])   . Lyme disease   . 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 ESOPHAGEAL VARICE LIGATION     TIPS  . HERNIA REPAIR      There were no vitals filed for this visit.      Subjective Assessment - 01/13/16 1438    Subjective Patient states his back is doing pretty well today. He reports 10% improvement overall. Moving from sitting to standing is still the worst.   Patient Stated Goals Get out of pain.   Currently in Pain? Yes   Pain Score 2    Pain Location Back                         [facility] Adult PT Treatment/Exercise - 01/13/16 0001      Lumbar Exercises: Aerobic   Stationary Bike NuStep L5 x18 min, posture focus, monitored for progression     Lumbar Exercises: Standing   Theraband Level (Scapular Retraction) --  XTS pink 4x 10 total   Other Standing Lumbar Exercises  diagonal chops 4 x 10 ea with 4# ball     Lumbar Exercises: Supine   Bent Knee Raise 20 reps;10 reps  High March with both feet up to start   Bridge 20 reps  bridge with marching  2x10                  PT Short Term Goals - 01/11/16 1444      PT SHORT TERM GOAL #1   Title Ind with a HEP.   Time 4   Period Weeks   Status Achieved     PT SHORT TERM GOAL #2   Title Perform ADL's with pain not > 2-3/10.   Baseline avg  3/10 with ADLS   Time 4   Period Weeks   Status Achieved     PT SHORT TERM GOAL #3   Title Perform transitory movements with pain not > 2-3/10.   Baseline still gets up to 10/10 with sit to stand.   Time 4   Status On-going                  Plan - 01/13/16 1515    Clinical Impression Statement Pt did fairly well with Rx today and was able to perform more challenging core exs and activities without increased LBP. He has progressed well towards goals, still unable to meet Transitional goal due to pain still. He denied Heat and Estim today due to time constraints.   Rehab Potential Excellent   PT Frequency 3x / week   PT Duration 4 weeks   PT Treatment/Interventions ADLs/Self Care Home Management;Electrical Stimulation;Ultrasound;Therapeutic exercise;Therapeutic activities;Patient/family education;Manual techniques   PT Next Visit Plan  Continue core strengthening with modalities PRN for pain control per MPT POC.   1 visit left unless new order   Consulted and Agree with Plan of Care Patient      Patient will benefit from skilled therapeutic intervention in order to improve the following deficits and impairments:  Abnormal gait, Pain, Decreased activity tolerance  Visit Diagnosis: Midline low back pain without sciatica  Abnormal posture     Problem List Patient Active Problem List   Diagnosis Date Noted  . Type 2 diabetes mellitus without complication ([place]) [id number]  . Hyperlipemia 12/22/2014  . Cirrhosis ([place]) 09/26/2013    RAMSEUR,CHRIS, PTA 01/13/2016, 3:33 PM  [facility] [place], [place], [address] Phone: [phone]   Fax:  [phone]  Name: Spencer Stanley MRN: [id number] Date of Birth: 1960/09/23

## 2016-01-20 ENCOUNTER — Ambulatory Visit: Payer: Commercial Managed Care - HMO | Attending: Physical Medicine and Rehabilitation | Admitting: *Deleted

## 2016-01-20 DIAGNOSIS — R293 Abnormal posture: Secondary | ICD-10-CM | POA: Insufficient documentation

## 2016-01-20 DIAGNOSIS — M545 Low back pain, unspecified: Secondary | ICD-10-CM

## 2016-01-20 NOTE — Therapy (Addendum)
[facility] [place], [place], [address] Phone: [phone]   Fax:  [phone]  Physical Therapy Treatment  Patient Details  Name: Spencer Stanley MRN: [id number] Date of Birth: 20-Apr-1961 Referring Provider: Suella Broad MD  Encounter Date: 01/20/2016      PT End of Session - 01/20/16 1448    Visit Number 12   Number of Visits 12   Date for PT Re-Evaluation 01/19/16   PT Start Time 7517   PT Stop Time 1522   PT Time Calculation (min) 49 min      Past Medical History:  Diagnosis Date  . Anxiety   . Cirrhosis ([place])   . Esophageal varices ([place])   . Lyme disease   . 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 ESOPHAGEAL VARICE LIGATION     TIPS  . HERNIA REPAIR      There were no vitals filed for this visit.      Subjective Assessment - 01/20/16 1441    Subjective PT has helped my LBP and I would like to keep coming. Send MD note   Patient Stated Goals Get out of pain.   Currently in Pain? Yes   Pain Score 3    Pain Location Back   Pain Orientation Lower   Pain Descriptors / Indicators Aching   Pain Type Chronic pain   Pain Onset More than a month ago                         [facility] Adult PT Treatment/Exercise - 01/20/16 0001      Lumbar Exercises: Supine   Ab Set 10 reps;5 seconds   Bent Knee Raise 20 reps;10 reps   Bridge 20 reps   Straight Leg Raise 20 reps;3 seconds   Other Supine Lumbar Exercises standing for scap retractions with draw ins with pink XTS 4x10     Lumbar Exercises: Prone   Straight Leg Raise 10 reps;3 seconds   Opposite Arm/Leg Raise Right arm/Left leg;Left arm/Right leg;10 reps;3 seconds     Moist Heat Therapy   Number Minutes Moist Heat 15 Minutes   Moist Heat Location Lumbar Spine     Electrical Stimulation   Electrical Stimulation Location B lumbar paraspinals   Electrical Stimulation Action IFC   Electrical  Stimulation Parameters 80-'150hz'  x 15 mins   Electrical Stimulation Goals Pain                  PT Short Term Goals - 01/20/16 1452      PT SHORT TERM GOAL #1   Title Ind with a HEP.   Time 4   Period Weeks   Status Achieved     PT SHORT TERM GOAL #2   Title Perform ADL's with pain not > 2-3/10.   Time 4   Period Weeks   Status Achieved     PT SHORT TERM GOAL #3   Title Perform transitory movements with pain not > 2-3/10.   Baseline still gets up to 10/10 with sit to stand.   Time 4   Period Weeks   Status Partially Met  Supine to sit is still 6-7/10                  Plan - 01/20/16 1449    Clinical Impression Statement Pt did fairly well with Rx today and feels that PT is helping with LBP  and function. He has currently Met all goals except LTG for transitional movements. He has partially Met this goal in that going from sit to stand is not a problem any more, but he still has pain with going from supine to sit and this goal is ongoing.   Rehab Potential Excellent   PT Frequency 3x / week   PT Duration 4 weeks   PT Treatment/Interventions ADLs/Self Care Home Management;Electrical Stimulation;Ultrasound;Therapeutic exercise;Therapeutic activities;Patient/family education;Manual techniques   PT Next Visit Plan Continue core strengthening with modalities PRN for pain control per MPT POC.   MD note to request N.O. for 2xwk/4 wks as per Pt   Consulted and Agree with Plan of Care Patient      Patient will benefit from skilled therapeutic intervention in order to improve the following deficits and impairments:  Abnormal gait, Pain, Decreased activity tolerance  Visit Diagnosis: Midline low back pain without sciatica  Abnormal posture     Problem List Patient Active Problem List   Diagnosis Date Noted  . Type 2 diabetes mellitus without complication ([place]) 91/63/8466  . Hyperlipemia 12/22/2014  . Cirrhosis ([place]) 09/26/2013    Marelin Tat,CHRIS,  PTA 01/20/2016, 3:33 PM  [facility] [address] [place], [place], [address] Phone: [phone]   Fax:  [phone]  Name: Spencer Stanley MRN: [id number] Date of Birth: 10/18/1960  PHYSICAL THERAPY DISCHARGE SUMMARY  Visits from Start of Care: 12.  Current functional level related to goals / functional outcomes: See above.   Remaining deficits: Goal #3 unmet.   Education / Equipment: HEP. Plan: Patient agrees to discharge.  Patient goals were partially met. Patient is being discharged due to meeting the stated rehab goals.  ?????          Mali Applegate MPT

## 2016-02-09 DIAGNOSIS — Z95828 Presence of other vascular implants and grafts: Secondary | ICD-10-CM | POA: Diagnosis not present

## 2016-02-09 DIAGNOSIS — K766 Portal hypertension: Secondary | ICD-10-CM | POA: Diagnosis not present

## 2016-02-24 ENCOUNTER — Other Ambulatory Visit: Payer: Self-pay | Admitting: Family Medicine

## 2016-02-25 ENCOUNTER — Encounter: Payer: Self-pay | Admitting: Family Medicine

## 2016-02-25 ENCOUNTER — Ambulatory Visit (INDEPENDENT_AMBULATORY_CARE_PROVIDER_SITE_OTHER): Payer: Commercial Managed Care - HMO | Admitting: Family Medicine

## 2016-02-25 VITALS — BP 98/58 | HR 73 | Temp 99.0°F | Ht 69.0 in | Wt 185.1 lb

## 2016-02-25 DIAGNOSIS — E119 Type 2 diabetes mellitus without complications: Secondary | ICD-10-CM

## 2016-02-25 DIAGNOSIS — Z23 Encounter for immunization: Secondary | ICD-10-CM

## 2016-02-25 DIAGNOSIS — K7030 Alcoholic cirrhosis of liver without ascites: Secondary | ICD-10-CM

## 2016-02-25 DIAGNOSIS — E782 Mixed hyperlipidemia: Secondary | ICD-10-CM | POA: Diagnosis not present

## 2016-02-25 LAB — BAYER DCA HB A1C WAIVED: HB A1C (BAYER DCA - WAIVED): 6.9 % (ref <7.0)

## 2016-02-25 NOTE — Progress Notes (Signed)
Subjective:  Patient ID: Spencer Stanley, male    DOB: [birth year]  Age: 55 y.o. MRN: [id number]  CC: Cirrhosis (pt here today for routine follow up and he has upcoming appt with his liver specialist and ortho for his back.)   HPI Spencer Stanley presents for  follow-up of hypertension. Patient has no history of headache chest pain or shortness of breath or recent cough. Patient also denies symptoms of TIA such as numbness weakness lateralizing. Patient checks  blood pressure at home and has not had any elevated readings recently. Patient denies side effects from his medication. States taking it regularly.  Patient also  in for follow-up of elevated cholesterol. Doing well without complaints on current medication. Denies side effects of statin including myalgia and arthralgia and nausea. Also in today for liver function testing. Currently no chest pain, shortness of breath or other cardiovascular related symptoms noted.  Follow-up of diabetes. Patient does check blood sugar at home. Readings run between 120 and 140 Patient denies symptoms such as polyuria, polydipsia, excessive hunger, nausea No significant hypoglycemic spells noted. Medications as noted below. Taking them regularly without complication/adverse reaction being reported today.    History Spencer Stanley has a past medical history of Anxiety; Cirrhosis ([place]); Esophageal varices ([place]); Lyme disease; and [facility] spotted fever.   He has a past surgical history that includes Hernia repair and Esophageal varice ligation.   His family history includes Aneurysm in his mother; Cancer in his father and mother; Dementia in his father; Diabetes in his sister; Heart failure in his father.He reports that he has been smoking.  He has a 33.00 pack-year smoking history. He quit smokeless tobacco use about 36 years ago. His smokeless tobacco use included Chew. He reports that he does not drink alcohol or use drugs.  Current Outpatient Prescriptions on File  Prior to Visit  Medication Sig Dispense Refill  . albuterol (PROVENTIL HFA;VENTOLIN HFA) 108 (90 BASE) MCG/ACT inhaler Inhale 2 puffs into the lungs every 6 (six) hours as needed for wheezing or shortness of breath. 18 g 3  . atorvastatin (LIPITOR) 40 MG tablet Take 1 tablet (40 mg total) by mouth daily. For cholesterol 90 tablet 3  . CALCIUM-MAGNESIUM-ZINC PO Take 1 capsule by mouth 2 (two) times daily.    . ciclopirox (CICLODAN) 0.77 % cream Apply topically 2 (two) times daily. 90 g 2  . FLUoxetine (PROZAC) 40 MG capsule Take 1 capsule (40 mg total) by mouth daily. 90 capsule 3  . lactulose (CHRONULAC) 10 GM/15ML solution TAKE 45 ML (30 GM) BY MOUTH 3 (THREE) TIMES DAILY. 2640 mL 11  . lisinopril (PRINIVIL,ZESTRIL) 20 MG tablet Take 0.5 tablets (10 mg total) by mouth daily. 90 tablet 1  . metFORMIN (GLUCOPHAGE-XR) 750 MG 24 hr tablet TAKE 2 TABLETS EVERY DAY WITH BREAKFAST 180 tablet 1  . minocycline (MINOCIN,DYNACIN) 100 MG capsule TAKE 1 CAPSULE (100 MG TOTAL) BY MOUTH 2 (TWO) TIMES DAILY. 180 capsule 1  . multivitamin (ONE-A-DAY MEN'S) TABS tablet Take 1 tablet by mouth daily.    . pantoprazole (PROTONIX) 40 MG tablet Take 1 tablet (40 mg total) by mouth daily. 90 tablet 4  . rifaximin (XIFAXAN) 550 MG TABS tablet Take 1 tablet (550 mg total) by mouth 2 (two) times daily. 60 tablet 5  . spironolactone (ALDACTONE) 25 MG tablet TAKE 1 TABLET (25 MG TOTAL) BY MOUTH DAILY. 90 tablet 1  . tadalafil (CIALIS) 20 MG tablet Take 0.5-1 tablets (10-20 mg total) by mouth every other  day as needed for erectile dysfunction. 15 tablet 4   No current facility-administered medications on file prior to visit.     ROS Review of Systems  Constitutional: Negative for chills, diaphoresis, fever and unexpected weight change.  HENT: Negative for congestion, hearing loss, rhinorrhea and sore throat.   Eyes: Negative for visual disturbance.  Respiratory: Negative for cough and shortness of breath.     Cardiovascular: Negative for chest pain.  Gastrointestinal: Negative for abdominal pain, constipation and diarrhea.  Genitourinary: Negative for dysuria and flank pain.  Musculoskeletal: Negative for arthralgias and joint swelling.  Skin: Negative for rash.  Neurological: Negative for dizziness and headaches.  Psychiatric/Behavioral: Negative for dysphoric mood and sleep disturbance.    Objective:  BP (!) 98/58   Pulse 73   Temp 99 F (37.2 C) (Oral)   Ht _0  (1.753 m)   Wt 185 lb 2 oz (84 kg)   BMI 27.34 kg/m   BP Readings from Last 3 Encounters:  02/25/16 (!) 98/58  11/23/15 106/71  08/25/15 (!) 85/57    Wt Readings from Last 3 Encounters:  02/25/16 185 lb 2 oz (84 kg)  11/23/15 180 lb 12.8 oz (82 kg)  08/25/15 182 lb 3.2 oz (82.6 kg)     Physical Exam  Constitutional: He is oriented to person, place, and time. He appears well-developed and well-nourished. No distress.  HENT:  Head: Normocephalic and atraumatic.  Right Ear: External ear normal.  Left Ear: External ear normal.  Nose: Nose normal.  Mouth/Throat: Oropharynx is clear and moist.  Eyes: Conjunctivae and EOM are normal. Pupils are equal, round, and reactive to light.  Neck: Normal range of motion. Neck supple. No thyromegaly present.  Cardiovascular: Normal rate, regular rhythm and normal heart sounds.   No murmur heard. Pulmonary/Chest: Effort normal and breath sounds normal. No respiratory distress. He has no wheezes. He has no rales.  Abdominal: Soft. Bowel sounds are normal. He exhibits no distension. There is no tenderness.  Lymphadenopathy:    He has no cervical adenopathy.  Neurological: He is alert and oriented to person, place, and time. He has normal reflexes.  Skin: Skin is warm and dry.  Psychiatric: He has a normal mood and affect. His behavior is normal. Judgment and thought content normal.    Lab Results  Component Value Date   HGBA1C 6.3 05/21/2015   HGBA1C 6.3 03/26/2015    HGBA1C 5.7 12/22/2014    Lab Results  Component Value Date   WBC 6.2 02/25/2016   HGB 12.6 (A) 12/12/2013   HCT 40.6 02/25/2016   PLT 81 (LL) 02/25/2016   GLUCOSE 107 (H) 02/25/2016   CHOL 105 02/25/2016   TRIG 90 02/25/2016   HDL 41 02/25/2016   LDLCALC 46 02/25/2016   ALT 25 02/25/2016   AST 28 02/25/2016   NA 141 02/25/2016   K 4.1 02/25/2016   CL 104 02/25/2016   CREATININE 0.87 02/25/2016   BUN 11 02/25/2016   CO2 21 02/25/2016   TSH 0.760 12/22/2014   INR 1.3 (H) 06/18/2013   HGBA1C 6.3 05/21/2015   MICROALBUR 100 08/24/2014    Patient was never admitted.  Assessment & Plan:   Spencer Stanley was seen today for cirrhosis.  Diagnoses and all orders for this visit:  Alcoholic cirrhosis of liver without ascites ([place]) -     CBC with Differential/Platelet -     Ammonia  Mixed hyperlipidemia -     CBC with Differential/Platelet -  Lipid panel  Type 2 diabetes mellitus without complication, without long-term current use of insulin (HCC) -     CBC with Differential/Platelet -     CMP14+EGFR -     Bayer DCA Hb A1c Waived -     Microalbumin / creatinine urine ratio  Encounter for immunization -     Flu Vaccine QUAD 36+ mos IM   I am having Spencer Stanley maintain his multivitamin, CALCIUM-MAGNESIUM-ZINC PO, FLUoxetine, atorvastatin, pantoprazole, albuterol, rifaximin, ciclopirox, lisinopril, lactulose, metFORMIN, tadalafil, spironolactone, and minocycline.  No orders of the defined types were placed in this encounter.    Follow-up: Return in about 3 months (around 05/27/2016).  Claretta Fraise, M.D.

## 2016-02-26 LAB — CMP14+EGFR
A/G RATIO: 1.4 (ref 1.2–2.2)
ALT: 25 IU/L (ref 0–44)
AST: 28 IU/L (ref 0–40)
Albumin: 3.8 g/dL (ref 3.5–5.5)
Alkaline Phosphatase: 134 IU/L — ABNORMAL HIGH (ref 39–117)
BUN/Creatinine Ratio: 13 (ref 9–20)
BUN: 11 mg/dL (ref 6–24)
Bilirubin Total: 1.4 mg/dL — ABNORMAL HIGH (ref 0.0–1.2)
CALCIUM: 10.3 mg/dL — AB (ref 8.7–10.2)
CO2: 21 mmol/L (ref 18–29)
CREATININE: 0.87 mg/dL (ref 0.76–1.27)
Chloride: 104 mmol/L (ref 96–106)
GFR, EST AFRICAN AMERICAN: 112 mL/min/{1.73_m2} (ref >59)
GFR, EST NON AFRICAN AMERICAN: 96 mL/min/{1.73_m2} (ref >59)
Globulin, Total: 2.7 g/dL (ref 1.5–4.5)
Glucose: 107 mg/dL — ABNORMAL HIGH (ref 65–99)
Potassium: 4.1 mmol/L (ref 3.5–5.2)
Sodium: 141 mmol/L (ref 134–144)
TOTAL PROTEIN: 6.5 g/dL (ref 6.0–8.5)

## 2016-02-26 LAB — MICROALBUMIN / CREATININE URINE RATIO
CREATININE, UR: 324.1 mg/dL
Microalb/Creat Ratio: 21 mg/g creat (ref 0.0–30.0)
Microalbumin, Urine: 67.9 ug/mL

## 2016-02-26 LAB — CBC WITH DIFFERENTIAL/PLATELET
BASOS ABS: 0.1 10*3/uL (ref 0.0–0.2)
Basos: 1 %
EOS (ABSOLUTE): 0.2 10*3/uL (ref 0.0–0.4)
Eos: 4 %
Hematocrit: 40.6 % (ref 37.5–51.0)
Hemoglobin: 14 g/dL (ref 12.6–17.7)
Immature Grans (Abs): 0 10*3/uL (ref 0.0–0.1)
Immature Granulocytes: 0 %
LYMPHS ABS: 1.2 10*3/uL (ref 0.7–3.1)
Lymphs: 20 %
MCH: 29.8 pg (ref 26.6–33.0)
MCHC: 34.5 g/dL (ref 31.5–35.7)
MCV: 86 fL (ref 79–97)
MONOS ABS: 0.6 10*3/uL (ref 0.1–0.9)
Monocytes: 9 %
NEUTROS ABS: 4.1 10*3/uL (ref 1.4–7.0)
Neutrophils: 66 %
PLATELETS: 81 10*3/uL — AB (ref 150–379)
RBC: 4.7 x10E6/uL (ref 4.14–5.80)
RDW: 14 % (ref 12.3–15.4)
WBC: 6.2 10*3/uL (ref 3.4–10.8)

## 2016-02-26 LAB — LIPID PANEL
CHOL/HDL RATIO: 2.6 ratio (ref 0.0–5.0)
Cholesterol, Total: 105 mg/dL (ref 100–199)
HDL: 41 mg/dL (ref >39)
LDL CALC: 46 mg/dL (ref 0–99)
TRIGLYCERIDES: 90 mg/dL (ref 0–149)
VLDL CHOLESTEROL CAL: 18 mg/dL (ref 5–40)

## 2016-02-26 LAB — AMMONIA: AMMONIA: 184 ug/dL — AB (ref 27–102)

## 2016-03-23 DIAGNOSIS — M5136 Other intervertebral disc degeneration, lumbar region: Secondary | ICD-10-CM | POA: Diagnosis not present

## 2016-05-05 DIAGNOSIS — K7290 Hepatic failure, unspecified without coma: Secondary | ICD-10-CM | POA: Diagnosis not present

## 2016-05-05 DIAGNOSIS — K7030 Alcoholic cirrhosis of liver without ascites: Secondary | ICD-10-CM | POA: Diagnosis not present

## 2016-05-05 DIAGNOSIS — R161 Splenomegaly, not elsewhere classified: Secondary | ICD-10-CM | POA: Diagnosis not present

## 2016-05-05 DIAGNOSIS — K7460 Unspecified cirrhosis of liver: Secondary | ICD-10-CM | POA: Diagnosis not present

## 2016-05-05 DIAGNOSIS — K8020 Calculus of gallbladder without cholecystitis without obstruction: Secondary | ICD-10-CM | POA: Diagnosis not present

## 2016-05-05 DIAGNOSIS — F17210 Nicotine dependence, cigarettes, uncomplicated: Secondary | ICD-10-CM | POA: Diagnosis not present

## 2016-05-05 DIAGNOSIS — F1021 Alcohol dependence, in remission: Secondary | ICD-10-CM | POA: Diagnosis not present

## 2016-05-05 DIAGNOSIS — K766 Portal hypertension: Secondary | ICD-10-CM | POA: Diagnosis not present

## 2016-05-23 ENCOUNTER — Other Ambulatory Visit: Payer: Self-pay | Admitting: Family Medicine

## 2016-05-24 NOTE — Telephone Encounter (Signed)
Patient last seen in office on 02-25-16. Rx last prescribed on 11-15-15 #90 with 1RF. Please advise and route to Pool B so nurse can phone in to pharmacy

## 2016-05-27 ENCOUNTER — Other Ambulatory Visit: Payer: Self-pay | Admitting: Family Medicine

## 2016-07-13 ENCOUNTER — Other Ambulatory Visit: Payer: Self-pay | Admitting: Family Medicine

## 2016-08-25 ENCOUNTER — Ambulatory Visit: Payer: Commercial Managed Care - HMO | Admitting: Family Medicine

## 2016-08-25 ENCOUNTER — Other Ambulatory Visit: Payer: Self-pay | Admitting: Family Medicine

## 2016-08-29 NOTE — Telephone Encounter (Signed)
Authorize 30 days only. Then contact the patient letting them know that they will need an appointment before any further prescriptions can be sent in. 

## 2016-08-29 NOTE — Telephone Encounter (Signed)
Patient has an appt to be seen on 09/02/2015

## 2016-08-31 ENCOUNTER — Other Ambulatory Visit: Payer: Self-pay | Admitting: Family Medicine

## 2016-08-31 ENCOUNTER — Other Ambulatory Visit: Payer: Medicare HMO

## 2016-08-31 ENCOUNTER — Telehealth: Payer: Self-pay | Admitting: Family Medicine

## 2016-08-31 DIAGNOSIS — E782 Mixed hyperlipidemia: Secondary | ICD-10-CM | POA: Diagnosis not present

## 2016-08-31 DIAGNOSIS — Z1212 Encounter for screening for malignant neoplasm of rectum: Secondary | ICD-10-CM | POA: Diagnosis not present

## 2016-08-31 DIAGNOSIS — E119 Type 2 diabetes mellitus without complications: Secondary | ICD-10-CM

## 2016-08-31 DIAGNOSIS — R5383 Other fatigue: Secondary | ICD-10-CM | POA: Diagnosis not present

## 2016-08-31 DIAGNOSIS — K7030 Alcoholic cirrhosis of liver without ascites: Secondary | ICD-10-CM

## 2016-08-31 LAB — BAYER DCA HB A1C WAIVED: HB A1C: 6.3 % (ref <7.0)

## 2016-08-31 NOTE — Telephone Encounter (Signed)
Labs ordered.

## 2016-08-31 NOTE — Telephone Encounter (Signed)
Patient aware that orders have been placed.

## 2016-08-31 NOTE — Telephone Encounter (Signed)
Patient called stating that he has an appt to be seen tomorrow for follow up and would like to come in today 4/19 to have labs drawn.  Routed message to Dr. Livia Snellen to see what labs he would like for patient to have

## 2016-09-01 ENCOUNTER — Encounter: Payer: Self-pay | Admitting: Family Medicine

## 2016-09-01 ENCOUNTER — Ambulatory Visit (INDEPENDENT_AMBULATORY_CARE_PROVIDER_SITE_OTHER): Payer: Medicare HMO | Admitting: Family Medicine

## 2016-09-01 VITALS — BP 96/58 | HR 94 | Temp 98.6°F | Ht 69.0 in | Wt 182.0 lb

## 2016-09-01 DIAGNOSIS — R5383 Other fatigue: Secondary | ICD-10-CM | POA: Diagnosis not present

## 2016-09-01 DIAGNOSIS — K7030 Alcoholic cirrhosis of liver without ascites: Secondary | ICD-10-CM | POA: Diagnosis not present

## 2016-09-01 DIAGNOSIS — E119 Type 2 diabetes mellitus without complications: Secondary | ICD-10-CM | POA: Diagnosis not present

## 2016-09-01 LAB — CMP14+EGFR
A/G RATIO: 1.3 (ref 1.2–2.2)
ALK PHOS: 133 IU/L — AB (ref 39–117)
ALT: 34 IU/L (ref 0–44)
AST: 41 IU/L — AB (ref 0–40)
Albumin: 3.8 g/dL (ref 3.5–5.5)
BILIRUBIN TOTAL: 1.6 mg/dL — AB (ref 0.0–1.2)
BUN/Creatinine Ratio: 11 (ref 9–20)
BUN: 11 mg/dL (ref 6–24)
CALCIUM: 9.6 mg/dL (ref 8.7–10.2)
CHLORIDE: 105 mmol/L (ref 96–106)
CO2: 19 mmol/L (ref 18–29)
Creatinine, Ser: 0.98 mg/dL (ref 0.76–1.27)
GFR calc Af Amer: 99 mL/min/{1.73_m2} (ref >59)
GFR, EST NON AFRICAN AMERICAN: 86 mL/min/{1.73_m2} (ref >59)
GLOBULIN, TOTAL: 2.9 g/dL (ref 1.5–4.5)
Glucose: 115 mg/dL — ABNORMAL HIGH (ref 65–99)
POTASSIUM: 3.8 mmol/L (ref 3.5–5.2)
SODIUM: 140 mmol/L (ref 134–144)
Total Protein: 6.7 g/dL (ref 6.0–8.5)

## 2016-09-01 LAB — CBC WITH DIFFERENTIAL/PLATELET
BASOS: 1 %
Basophils Absolute: 0.1 10*3/uL (ref 0.0–0.2)
EOS (ABSOLUTE): 0.3 10*3/uL (ref 0.0–0.4)
EOS: 4 %
HEMATOCRIT: 42.7 % (ref 37.5–51.0)
Hemoglobin: 14.5 g/dL (ref 13.0–17.7)
Immature Grans (Abs): 0 10*3/uL (ref 0.0–0.1)
Immature Granulocytes: 0 %
LYMPHS ABS: 1.8 10*3/uL (ref 0.7–3.1)
Lymphs: 27 %
MCH: 30.1 pg (ref 26.6–33.0)
MCHC: 34 g/dL (ref 31.5–35.7)
MCV: 89 fL (ref 79–97)
MONOS ABS: 0.7 10*3/uL (ref 0.1–0.9)
Monocytes: 10 %
NEUTROS ABS: 3.8 10*3/uL (ref 1.4–7.0)
NEUTROS PCT: 58 %
PLATELETS: 103 10*3/uL — AB (ref 150–379)
RBC: 4.81 x10E6/uL (ref 4.14–5.80)
RDW: 14.9 % (ref 12.3–15.4)
WBC: 6.6 10*3/uL (ref 3.4–10.8)

## 2016-09-01 LAB — LIPID PANEL
CHOL/HDL RATIO: 2.7 ratio (ref 0.0–5.0)
CHOLESTEROL TOTAL: 112 mg/dL (ref 100–199)
HDL: 42 mg/dL (ref >39)
LDL CALC: 51 mg/dL (ref 0–99)
TRIGLYCERIDES: 97 mg/dL (ref 0–149)
VLDL CHOLESTEROL CAL: 19 mg/dL (ref 5–40)

## 2016-09-01 LAB — AMMONIA: AMMONIA: 121 ug/dL — AB (ref 27–102)

## 2016-09-01 LAB — PSA TOTAL (REFLEX TO FREE): Prostate Specific Ag, Serum: 0.9 ng/mL (ref 0.0–4.0)

## 2016-09-01 MED ORDER — ATORVASTATIN CALCIUM 40 MG PO TABS: ORAL_TABLET | ORAL | 1 refills | Status: DC

## 2016-09-01 MED ORDER — FLUOXETINE HCL 20 MG PO CAPS: 20.0000 mg | ORAL_CAPSULE | Freq: Every day | ORAL | 3 refills | Status: DC

## 2016-09-01 MED ORDER — METFORMIN HCL ER 750 MG PO TB24: 750.0000 mg | ORAL_TABLET | Freq: Every day | ORAL | 1 refills | Status: DC

## 2016-09-01 MED ORDER — TADALAFIL 20 MG PO TABS: 10.0000 mg | ORAL_TABLET | ORAL | 4 refills | Status: DC | PRN

## 2016-09-01 MED ORDER — PANTOPRAZOLE SODIUM 40 MG PO TBEC: 40.0000 mg | DELAYED_RELEASE_TABLET | Freq: Every day | ORAL | 1 refills | Status: DC

## 2016-09-01 MED ORDER — BLOOD GLUCOSE MONITOR SYSTEM W/DEVICE KIT: PACK | 0 refills | Status: DC

## 2016-09-01 MED ORDER — METFORMIN HCL ER 750 MG PO TB24: 750.0000 mg | ORAL_TABLET | Freq: Every day | ORAL | 3 refills | Status: DC

## 2016-09-01 NOTE — Progress Notes (Signed)
Subjective:  Patient ID: Spencer Stanley, male    DOB: [birth year]  Age: 56 y.o. MRN: [id number]  CC: Cirrhosis (pt here today following up on chronic medical conditions and he is c/o being extremely tired and sleeping all the tim.)   HPI Spencer Stanley presents for Continuing to follow-up Spencer Stanley for cirrhosis. He says his energy is very low. He is even more tired than normal. He denies problems with appetite that is good. He says he is having some dizzy spells periodically. Those occurred twice yesterday. A lightheaded feeling of faintness short-lived or perhaps some 2-5 minutes at most. Blood sugars have been stable. Numbers in the low 100s usually. No hypoglycemia. These spells not associated with low sugar. Lab results drawn prior to this visit noted below.  History Spencer Stanley has a past medical history of Anxiety; Cirrhosis ([place]); Esophageal varices ([place]); Lyme disease; and [facility] spotted fever.   He has a past surgical history that includes Hernia repair and Esophageal varice ligation.   His family history includes Aneurysm in his mother; Cancer in his father and mother; Dementia in his father; Diabetes in his sister; Heart failure in his father.He reports that he has been smoking.  He has a 33.00 pack-year smoking history. He quit smokeless tobacco use about 37 years ago. His smokeless tobacco use included Chew. He reports that he does not drink alcohol or use drugs.    ROS Review of Systems  Constitutional: Negative for chills, diaphoresis and fever.  HENT: Negative for rhinorrhea and sore throat.   Respiratory: Negative for cough and shortness of breath.   Cardiovascular: Negative for chest pain.  Gastrointestinal: Negative for abdominal pain.  Musculoskeletal: Negative for arthralgias and myalgias.  Skin: Negative for rash.  Neurological: Negative for weakness and headaches.    Objective:  BP (!) 96/58   Pulse 94   Temp 98.6 F (37 C) (Oral)   Ht _0  (1.753 m)   Wt 182 lb  (82.6 kg)   BMI 26.88 kg/m   BP Readings from Last 3 Encounters:  09/01/16 (!) 96/58  02/25/16 (!) 98/58  11/23/15 106/71    Wt Readings from Last 3 Encounters:  09/01/16 182 lb (82.6 kg)  02/25/16 185 lb 2 oz (84 kg)  11/23/15 180 lb 12.8 oz (82 kg)     Physical Exam  Constitutional: He appears well-developed and well-nourished.  HENT:  Head: Normocephalic and atraumatic.  Right Ear: Tympanic membrane and external ear normal. No decreased hearing is noted.  Left Ear: Tympanic membrane and external ear normal. No decreased hearing is noted.  Mouth/Throat: No oropharyngeal exudate or posterior oropharyngeal erythema.  Eyes: Pupils are equal, round, and reactive to light.  Neck: Normal range of motion. Neck supple.  Cardiovascular: Normal rate and regular rhythm.   No murmur heard. Pulmonary/Chest: Breath sounds normal. No respiratory distress.  Abdominal: Soft. Bowel sounds are normal. He exhibits no mass. There is no tenderness.  Vitals reviewed.    Results for orders placed or performed in visit on 08/31/16  CBC with Differential/Platelet  Result Value Ref Range   WBC 6.6 3.4 - 10.8 x10E3/uL   RBC 4.81 4.14 - 5.80 x10E6/uL   Hemoglobin 14.5 13.0 - 17.7 g/dL   Hematocrit 42.7 37.5 - 51.0 %   MCV 89 79 - 97 fL   MCH 30.1 26.6 - 33.0 pg   MCHC 34.0 31.5 - 35.7 g/dL   RDW 14.9 12.3 - 15.4 %   Platelets 103 (L) 150 -  379 x10E3/uL   Neutrophils 58 Not Estab. %   Lymphs 27 Not Estab. %   Monocytes 10 Not Estab. %   Eos 4 Not Estab. %   Basos 1 Not Estab. %   Neutrophils Absolute 3.8 1.4 - 7.0 x10E3/uL   Lymphocytes Absolute 1.8 0.7 - 3.1 x10E3/uL   Monocytes Absolute 0.7 0.1 - 0.9 x10E3/uL   EOS (ABSOLUTE) 0.3 0.0 - 0.4 x10E3/uL   Basophils Absolute 0.1 0.0 - 0.2 x10E3/uL   Immature Granulocytes 0 Not Estab. %   Immature Grans (Abs) 0.0 0.0 - 0.1 x10E3/uL  CMP14+EGFR  Result Value Ref Range   Glucose 115 (H) 65 - 99 mg/dL   BUN 11 6 - 24 mg/dL   Creatinine, Ser  0.98 0.76 - 1.27 mg/dL   GFR calc non Af Amer 86 >59 mL/min/1.73   GFR calc Af Amer 99 >59 mL/min/1.73   BUN/Creatinine Ratio 11 9 - 20   Sodium 140 134 - 144 mmol/L   Potassium 3.8 3.5 - 5.2 mmol/L   Chloride 105 96 - 106 mmol/L   CO2 19 18 - 29 mmol/L   Calcium 9.6 8.7 - 10.2 mg/dL   Total Protein 6.7 6.0 - 8.5 g/dL   Albumin 3.8 3.5 - 5.5 g/dL   Globulin, Total 2.9 1.5 - 4.5 g/dL   Albumin/Globulin Ratio 1.3 1.2 - 2.2   Bilirubin Total 1.6 (H) 0.0 - 1.2 mg/dL   Alkaline Phosphatase 133 (H) 39 - 117 IU/L   AST 41 (H) 0 - 40 IU/L   ALT 34 0 - 44 IU/L  Lipid panel  Result Value Ref Range   Cholesterol, Total 112 100 - 199 mg/dL   Triglycerides 97 0 - 149 mg/dL   HDL 42 >39 mg/dL   VLDL Cholesterol Cal 19 5 - 40 mg/dL   LDL Calculated 51 0 - 99 mg/dL   Chol/HDL Ratio 2.7 0.0 - 5.0 ratio  PSA Total (Reflex To Free)  Result Value Ref Range   Prostate Specific Ag, Serum 0.9 0.0 - 4.0 ng/mL   Reflex Criteria Comment   Bayer DCA Hb A1c Waived  Result Value Ref Range   Bayer DCA Hb A1c Waived 6.3 <7.0 %  Ammonia  Result Value Ref Range   Ammonia 121 (HH) 27 - 102 ug/dL  TSH  Result Value Ref Range   TSH 1.930 0.450 - 4.500 uIU/mL  Specimen status report  Result Value Ref Range   specimen status report Comment     Assessment & Plan:   Izzy was seen today for cirrhosis.  Diagnoses and all orders for this visit:  Fatigue, unspecified type -     TSH  Alcoholic cirrhosis of liver without ascites ([place])  Type 2 diabetes mellitus without complication, without long-term current use of insulin ([place])  Other orders -     Discontinue: metFORMIN (GLUCOPHAGE-XR) 750 MG 24 hr tablet; Take 1 tablet (750 mg total) by mouth daily with breakfast. -     FLUoxetine (PROZAC) 20 MG capsule; Take 1 capsule (20 mg total) by mouth daily. -     atorvastatin (LIPITOR) 40 MG tablet; TAKE 1 TABLET EVERY DAY FOR CHOLESTEROL -     metFORMIN (GLUCOPHAGE-XR) 750 MG 24 hr tablet; Take 1 tablet (750  mg total) by mouth daily with breakfast. -     pantoprazole (PROTONIX) 40 MG tablet; Take 1 tablet (40 mg total) by mouth daily. -     Blood Glucose Monitoring Suppl (BLOOD GLUCOSE MONITOR SYSTEM)  w/Device KIT; Check BS three times daily. DX E11.9. -     tadalafil (CIALIS) 20 MG tablet; Take 0.5-1 tablets (10-20 mg total) by mouth every other day as needed for erectile dysfunction.       I have discontinued Mr. Chahal lisinopril and FLUoxetine. I have also changed his pantoprazole. Additionally, I am having him start on FLUoxetine and Blood Glucose Monitor System. Lastly, I am having him maintain his multivitamin, CALCIUM-MAGNESIUM-ZINC PO, albuterol, rifaximin, lactulose, ciclopirox, spironolactone, minocycline, atorvastatin, metFORMIN, and tadalafil.  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       Medication List       Accurate as of 09/01/16 11:59 PM. Always use your most recent med list.          albuterol 108 (90 Base) MCG/ACT inhaler Commonly known as:  PROVENTIL HFA;VENTOLIN HFA Inhale 2 puffs into the lungs every 6 (six) hours as needed for wheezing or shortness of breath.   atorvastatin 40 MG tablet Commonly known as:  LIPITOR TAKE 1 TABLET EVERY DAY FOR CHOLESTEROL   Blood Glucose Monitor System w/Device Kit Check BS three times daily. DX E11.9.   CALCIUM-MAGNESIUM-ZINC PO Take 1 capsule by mouth 2 (two) times daily.   ciclopirox 0.77 % cream Commonly known as:  LOPROX APPLY  TOPICALLY TWICE DAILY   FLUoxetine 20 MG capsule Commonly known as:  PROZAC Take 1 capsule (20 mg total) by mouth daily.   lactulose 10 GM/15ML solution Commonly known as:  CHRONULAC TAKE 45 ML (30 GM) BY MOUTH 3 (THREE) TIMES DAILY.   metFORMIN 750 MG 24 hr tablet Commonly known as:  GLUCOPHAGE-XR Take 1 tablet (750 mg total)  by mouth daily with breakfast.   minocycline 100 MG capsule Commonly known as:  MINOCIN,DYNACIN TAKE 1 CAPSULE TWICE DAILY   multivitamin Tabs tablet Take 1 tablet by mouth daily.   pantoprazole 40 MG tablet Commonly known as:  PROTONIX Take 1 tablet (40 mg total) by mouth daily.   rifaximin 550 MG Tabs tablet Commonly known as:  XIFAXAN Take 1 tablet (550 mg total) by mouth 2 (two) times daily.   spironolactone 25 MG tablet Commonly known as:  ALDACTONE TAKE 1 TABLET EVERY DAY   tadalafil 20 MG tablet Commonly known as:  CIALIS Take 0.5-1 tablets (10-20 mg total) by mouth every other day as needed for erectile dysfunction.        Follow-up: Return in about 3 months (around 12/01/2016).  Claretta Fraise, M.D.

## 2016-09-05 LAB — TSH: TSH: 1.93 u[IU]/mL (ref 0.450–4.500)

## 2016-09-05 LAB — SPECIMEN STATUS REPORT

## 2016-10-02 ENCOUNTER — Ambulatory Visit (INDEPENDENT_AMBULATORY_CARE_PROVIDER_SITE_OTHER): Payer: Medicare HMO | Admitting: Family Medicine

## 2016-10-02 ENCOUNTER — Encounter: Payer: Self-pay | Admitting: Family Medicine

## 2016-10-02 VITALS — BP 110/68 | HR 76 | Temp 98.4°F | Ht 69.0 in | Wt 189.0 lb

## 2016-10-02 DIAGNOSIS — K7030 Alcoholic cirrhosis of liver without ascites: Secondary | ICD-10-CM

## 2016-10-02 DIAGNOSIS — E782 Mixed hyperlipidemia: Secondary | ICD-10-CM

## 2016-10-02 DIAGNOSIS — E119 Type 2 diabetes mellitus without complications: Secondary | ICD-10-CM | POA: Diagnosis not present

## 2016-10-02 MED ORDER — ACCU-CHEK MULTICLIX LANCETS MISC: 12 refills | Status: DC

## 2016-10-02 MED ORDER — ATORVASTATIN CALCIUM 10 MG PO TABS: ORAL_TABLET | ORAL | 1 refills | Status: DC

## 2016-10-02 MED ORDER — GLUCOSE BLOOD VI STRP: ORAL_STRIP | 12 refills | Status: DC

## 2016-10-02 NOTE — Progress Notes (Signed)
Subjective:  Patient ID: Spencer Stanley, male    DOB: 1960/07/08  Age: 56 y.o. MRN: [id number]  CC: Follow-up (pt here today following up after decreasing Paxil, Metformin and stopping Lisinopril at last visit.)   HPI Mataeo Ingwersen presents for Severe lack of energy. He says that it's better about 10% after making the changes noted above. However his sugars staying good. He does confess that these at night L. He since to stay awake all night and then During the day. His activities are light due to this lack of energy. He is able to do his ADLs sufficiently. He does not take the lactulose regularly because it causes diarrhea.   History Wolf has a past medical history of Anxiety; Cirrhosis ([place]); Esophageal varices ([place]); Lyme disease; and [facility] spotted fever.   He has a past surgical history that includes Hernia repair and Esophageal varice ligation.   His family history includes Aneurysm in his mother; Cancer in his father and mother; Dementia in his father; Diabetes in his sister; Heart failure in his father.He reports that he has been smoking.  He has a 33.00 pack-year smoking history. He quit smokeless tobacco use about 37 years ago. His smokeless tobacco use included Chew. He reports that he does not drink alcohol or use drugs.    ROS Review of Systems  Constitutional: Positive for fatigue. Negative for chills, diaphoresis and fever.  HENT: Negative for rhinorrhea and sore throat.   Respiratory: Negative for cough and shortness of breath.   Cardiovascular: Negative for chest pain.  Gastrointestinal: Negative for abdominal pain.  Musculoskeletal: Negative for arthralgias and myalgias.  Skin: Negative for rash.  Neurological: Negative for weakness and headaches.    Objective:  BP 110/68   Pulse 76   Temp 98.4 F (36.9 C) (Oral)   Ht 5\' 9"  (1.753 m)   Wt 189 lb (85.7 kg)   BMI 27.91 kg/m   BP Readings from Last 3 Encounters:  10/02/16 110/68  09/01/16 (!) 96/58    02/25/16 (!) 98/58    Wt Readings from Last 3 Encounters:  10/02/16 189 lb (85.7 kg)  09/01/16 182 lb (82.6 kg)  02/25/16 185 lb 2 oz (84 kg)     Physical Exam  Constitutional: He is oriented to person, place, and time. He appears well-developed and well-nourished.  HENT:  Head: Normocephalic and atraumatic.  Right Ear: External ear normal.  Left Ear: External ear normal.  Mouth/Throat: No oropharyngeal exudate or posterior oropharyngeal erythema.  Eyes: Pupils are equal, round, and reactive to light.  Neck: Normal range of motion. Neck supple.  Cardiovascular: Normal rate and regular rhythm.   No murmur heard. Pulmonary/Chest: Breath sounds normal. No respiratory distress.  Neurological: He is alert and oriented to person, place, and time.  Vitals reviewed.     Assessment & Plan:   Viren was seen today for follow-up.  Diagnoses and all orders for this visit:  Alcoholic cirrhosis of liver without ascites ([place])  Type 2 diabetes mellitus without complication, without long-term current use of insulin (HCC)  Mixed hyperlipidemia  Other orders -     Discontinue: Lancets (ACCU-CHEK MULTICLIX) lancets; Use as instructed -     Discontinue: glucose blood test strip; Use as instructed -     atorvastatin (LIPITOR) 10 MG tablet; TAKE 1 TABLET EVERY DAY FOR CHOLESTEROL       I have discontinued Mr. Luu lactulose, minocycline, Blood Glucose Monitor System, accu-chek multiclix, and glucose blood. I have also changed  his atorvastatin. Additionally, I am having him maintain his multivitamin, CALCIUM-MAGNESIUM-ZINC PO, albuterol, rifaximin, ciclopirox, spironolactone, FLUoxetine, metFORMIN, pantoprazole, and tadalafil.  Allergies as of 10/02/2016      Reactions   Other Other (See Comments)   Other reaction(s): Mental Status Changes (intolerance) After medicated induced coma patient seeing things that weren't there.       Medication List       Accurate as of 10/02/16   6:20 PM. Always use your most recent med list.          albuterol 108 (90 Base) MCG/ACT inhaler Commonly known as:  PROVENTIL HFA;VENTOLIN HFA Inhale 2 puffs into the lungs every 6 (six) hours as needed for wheezing or shortness of breath.   atorvastatin 10 MG tablet Commonly known as:  LIPITOR TAKE 1 TABLET EVERY DAY FOR CHOLESTEROL   CALCIUM-MAGNESIUM-ZINC PO Take 1 capsule by mouth 2 (two) times daily.   ciclopirox 0.77 % cream Commonly known as:  LOPROX APPLY  TOPICALLY TWICE DAILY   FLUoxetine 20 MG capsule Commonly known as:  PROZAC Take 1 capsule (20 mg total) by mouth daily.   metFORMIN 750 MG 24 hr tablet Commonly known as:  GLUCOPHAGE-XR Take 1 tablet (750 mg total) by mouth daily with breakfast.   multivitamin Tabs tablet Take 1 tablet by mouth daily.   pantoprazole 40 MG tablet Commonly known as:  PROTONIX Take 1 tablet (40 mg total) by mouth daily.   rifaximin 550 MG Tabs tablet Commonly known as:  XIFAXAN Take 1 tablet (550 mg total) by mouth 2 (two) times daily.   spironolactone 25 MG tablet Commonly known as:  ALDACTONE TAKE 1 TABLET EVERY DAY   tadalafil 20 MG tablet Commonly known as:  CIALIS Take 0.5-1 tablets (10-20 mg total) by mouth every other day as needed for erectile dysfunction.        Follow-up: Return in about 3 months (around 01/02/2017).  Claretta Fraise, M.D.

## 2016-10-10 DIAGNOSIS — R188 Other ascites: Secondary | ICD-10-CM | POA: Diagnosis not present

## 2016-10-10 DIAGNOSIS — K7460 Unspecified cirrhosis of liver: Secondary | ICD-10-CM | POA: Diagnosis not present

## 2016-10-10 DIAGNOSIS — R161 Splenomegaly, not elsewhere classified: Secondary | ICD-10-CM | POA: Diagnosis not present

## 2016-10-10 DIAGNOSIS — Z9689 Presence of other specified functional implants: Secondary | ICD-10-CM | POA: Diagnosis not present

## 2016-10-16 ENCOUNTER — Other Ambulatory Visit: Payer: Self-pay | Admitting: Family Medicine

## 2016-10-16 DIAGNOSIS — K7460 Unspecified cirrhosis of liver: Secondary | ICD-10-CM | POA: Diagnosis not present

## 2016-10-16 DIAGNOSIS — Z79899 Other long term (current) drug therapy: Secondary | ICD-10-CM | POA: Diagnosis not present

## 2016-10-16 DIAGNOSIS — K7030 Alcoholic cirrhosis of liver without ascites: Secondary | ICD-10-CM | POA: Diagnosis not present

## 2016-10-16 DIAGNOSIS — E119 Type 2 diabetes mellitus without complications: Secondary | ICD-10-CM | POA: Diagnosis not present

## 2016-10-16 DIAGNOSIS — E785 Hyperlipidemia, unspecified: Secondary | ICD-10-CM | POA: Diagnosis not present

## 2016-10-16 DIAGNOSIS — F17210 Nicotine dependence, cigarettes, uncomplicated: Secondary | ICD-10-CM | POA: Diagnosis not present

## 2016-10-16 DIAGNOSIS — K7290 Hepatic failure, unspecified without coma: Secondary | ICD-10-CM | POA: Diagnosis not present

## 2016-10-16 DIAGNOSIS — Z7984 Long term (current) use of oral hypoglycemic drugs: Secondary | ICD-10-CM | POA: Diagnosis not present

## 2016-10-16 DIAGNOSIS — Z888 Allergy status to other drugs, medicaments and biological substances status: Secondary | ICD-10-CM | POA: Diagnosis not present

## 2016-10-19 ENCOUNTER — Other Ambulatory Visit: Payer: Self-pay | Admitting: *Deleted

## 2016-10-19 MED ORDER — ACCU-CHEK MULTICLIX LANCETS MISC: 12 refills | Status: DC

## 2016-11-10 ENCOUNTER — Other Ambulatory Visit: Payer: Self-pay | Admitting: Family Medicine

## 2016-11-13 NOTE — Telephone Encounter (Signed)
Last seen 10/02/16  Dr Livia Snellen

## 2016-12-20 ENCOUNTER — Ambulatory Visit (INDEPENDENT_AMBULATORY_CARE_PROVIDER_SITE_OTHER): Payer: Medicare HMO | Admitting: Physician Assistant

## 2016-12-20 VITALS — BP 108/70 | HR 91 | Temp 98.6°F | Resp 16 | Wt 189.0 lb

## 2016-12-20 DIAGNOSIS — K120 Recurrent oral aphthae: Secondary | ICD-10-CM

## 2016-12-22 ENCOUNTER — Encounter: Payer: Self-pay | Admitting: Physician Assistant

## 2016-12-22 MED ORDER — FIRST-DUKES MOUTHWASH MT SUSP: 1.0000 [IU] | Freq: Four times a day (QID) | OROMUCOSAL | 0 refills | Status: DC | PRN

## 2016-12-22 NOTE — Progress Notes (Signed)
BP 108/70   Pulse 91   Temp 98.6 F (37 C)   Resp 16   Wt 189 lb (85.7 kg)   BMI 27.91 kg/m    Subjective:    Patient ID: Spencer Stanley, male    DOB: Aug 03, 1960, 56 y.o.   MRN: [id number]  HPI: Spencer Stanley is a 56 y.o. male presenting on 12/20/2016 for No chief complaint on file.  Epic was down when patient was seen. Patient has a sore on the top of his tongue. It is somewhat irritated. It is sore to the touch. He has not had any trauma or injury. Acid base things do not burn the lesion. Patient does see [facility]  Relevant past medical, surgical, family and social history reviewed and updated as indicated. Allergies and medications reviewed and updated.  Past Medical History:  Diagnosis Date  . Anxiety   . Cirrhosis ([place])   . Esophageal varices ([place])   . Lyme disease   . 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 ESOPHAGEAL VARICE LIGATION     TIPS  . HERNIA REPAIR      Review of Systems  Constitutional: Negative.  Negative for appetite change and fatigue.  HENT: Positive for dental problem and mouth sores.   Eyes: Negative for pain and visual disturbance.  Respiratory: Negative.  Negative for cough, chest tightness, shortness of breath and wheezing.   Cardiovascular: Negative.  Negative for chest pain, palpitations and leg swelling.  Gastrointestinal: Negative.  Negative for abdominal pain, diarrhea, nausea and vomiting.  Genitourinary: Negative.   Skin: Negative.  Negative for color change and rash.  Neurological: Negative.  Negative for weakness, numbness and headaches.  Psychiatric/Behavioral: Negative.     Allergies as of 12/20/2016      Reactions   Other Other (See Comments)   Other reaction(s): Mental Status Changes (intolerance) After medicated induced coma patient seeing things that weren't there.       Medication List       Accurate as of 12/20/16 11:59 PM. Always use your most recent med list.          accu-chek multiclix lancets Check blood sugar three times a day   albuterol 108 (90 Base) MCG/ACT inhaler Commonly known as:  PROVENTIL HFA;VENTOLIN HFA Inhale 2 puffs into the lungs every 6 (six) hours as needed for wheezing or shortness of breath.   atorvastatin 10 MG tablet Commonly known as:  LIPITOR TAKE 1 TABLET EVERY DAY FOR CHOLESTEROL   CALCIUM-MAGNESIUM-ZINC PO Take 1 capsule by mouth 2 (two) times daily.   ciclopirox 0.77 % cream Commonly known as:  LOPROX APPLY  TOPICALLY TWICE DAILY   FIRST-DUKES MOUTHWASH Susp Use as directed 1 Units in the mouth or throat 4 (four) times daily as needed.   FLUoxetine 20 MG capsule Commonly known as:  PROZAC Take 1 capsule (20 mg total) by mouth daily.   metFORMIN 750 MG 24 hr tablet Commonly known as:  GLUCOPHAGE-XR Take 1 tablet (750 mg total) by mouth daily with breakfast.   minocycline 100 MG capsule Commonly known as:  MINOCIN,DYNACIN TAKE 1 CAPSULE TWICE DAILY   multivitamin Tabs tablet Take 1 tablet by mouth daily.   pantoprazole 40 MG tablet Commonly known as:  PROTONIX TAKE 1 TABLET EVERY DAY   rifaximin 550 MG Tabs tablet Commonly known as:  XIFAXAN Take 1 tablet (550 mg total) by mouth 2 (two) times daily.   spironolactone  25 MG tablet Commonly known as:  ALDACTONE TAKE 1 TABLET EVERY DAY   tadalafil 20 MG tablet Commonly known as:  CIALIS Take 0.5-1 tablets (10-20 mg total) by mouth every other day as needed for erectile dysfunction.          Objective:    BP 108/70   Pulse 91   Temp 98.6 F (37 C)   Resp 16   Wt 189 lb (85.7 kg)   BMI 27.91 kg/m   Allergies  Allergen Reactions  . Other Other (See Comments)    Other reaction(s): Mental Status Changes (intolerance) After medicated induced coma patient seeing things that weren't there.     Physical Exam  Constitutional: He appears well-developed and well-nourished. No distress.  HENT:  Head: Normocephalic and atraumatic.    Mouth/Throat: Mucous membranes are normal. Oral lesions present. No uvula swelling. No oropharyngeal exudate, posterior oropharyngeal edema or posterior oropharyngeal erythema.    Eyes: Pupils are equal, round, and reactive to light. Conjunctivae and EOM are normal.  Cardiovascular: Normal rate, regular rhythm and normal heart sounds.   Pulmonary/Chest: Effort normal and breath sounds normal. No respiratory distress.  Skin: Skin is warm and dry.  Psychiatric: He has a normal mood and affect. His behavior is normal.  Nursing note and vitals reviewed.       Assessment & Plan:   1. Ulcer aphthous oral - Diphenhyd-Hydrocort-Nystatin (FIRST-DUKES MOUTHWASH) SUSP; Use as directed 1 Units in the mouth or throat 4 (four) times daily as needed.  Dispense: 240 mL; Refill: 0    Current Outpatient Prescriptions:  .  albuterol (PROVENTIL HFA;VENTOLIN HFA) 108 (90 BASE) MCG/ACT inhaler, Inhale 2 puffs into the lungs every 6 (six) hours as needed for wheezing or shortness of breath., Disp: 18 g, Rfl: 3 .  atorvastatin (LIPITOR) 10 MG tablet, TAKE 1 TABLET EVERY DAY FOR CHOLESTEROL, Disp: 90 tablet, Rfl: 1 .  CALCIUM-MAGNESIUM-ZINC PO, Take 1 capsule by mouth 2 (two) times daily., Disp: , Rfl:  .  ciclopirox (LOPROX) 0.77 % cream, APPLY  TOPICALLY TWICE DAILY, Disp: 90 g, Rfl: 2 .  FLUoxetine (PROZAC) 20 MG capsule, Take 1 capsule (20 mg total) by mouth daily., Disp: 90 capsule, Rfl: 3 .  Lancets (ACCU-CHEK MULTICLIX) lancets, Check blood sugar three times a day, Disp: 102 each, Rfl: 12 .  metFORMIN (GLUCOPHAGE-XR) 750 MG 24 hr tablet, Take 1 tablet (750 mg total) by mouth daily with breakfast., Disp: 90 tablet, Rfl: 1 .  minocycline (MINOCIN,DYNACIN) 100 MG capsule, TAKE 1 CAPSULE TWICE DAILY, Disp: 180 capsule, Rfl: 1 .  multivitamin (ONE-A-DAY MEN'S) TABS tablet, Take 1 tablet by mouth daily., Disp: , Rfl:  .  pantoprazole (PROTONIX) 40 MG tablet, TAKE 1 TABLET EVERY DAY, Disp: 90 tablet, Rfl:  1 .  rifaximin (XIFAXAN) 550 MG TABS tablet, Take 1 tablet (550 mg total) by mouth 2 (two) times daily., Disp: 60 tablet, Rfl: 5 .  spironolactone (ALDACTONE) 25 MG tablet, TAKE 1 TABLET EVERY DAY, Disp: 90 tablet, Rfl: 1 .  tadalafil (CIALIS) 20 MG tablet, Take 0.5-1 tablets (10-20 mg total) by mouth every other day as needed for erectile dysfunction., Disp: 15 tablet, Rfl: 4 .  Diphenhyd-Hydrocort-Nystatin (FIRST-DUKES MOUTHWASH) SUSP, Use as directed 1 Units in the mouth or throat 4 (four) times daily as needed., Disp: 240 mL, Rfl: 0 Continue all other maintenance medications as listed above.  Follow up plan: Follow-up as needed or worsening of symptoms. Call office for any issues.   Educational  handout given for   Terald Sleeper PA-C [facility] [address]  [place], [place] [address] [phone]   12/22/2016, 1:46 PM

## 2016-12-27 ENCOUNTER — Telehealth: Payer: Self-pay | Admitting: Family Medicine

## 2016-12-27 MED ORDER — COLCHICINE 0.6 MG PO TABS: 0.6000 mg | ORAL_TABLET | Freq: Every day | ORAL | 1 refills | Status: DC

## 2016-12-27 NOTE — Telephone Encounter (Signed)
Patient aware.

## 2016-12-27 NOTE — Telephone Encounter (Signed)
Spoke with wife- states that patient has more blisters on his tongue but they are about the same size as they were when seeing [facility]. There is no white in his mouth and states when he was a child he did have a tongue ulcer on the back of his tongue but it was not as bad as this and did not last this long.

## 2016-12-27 NOTE — Telephone Encounter (Signed)
Are there more, just larger? Any white in the mouth like thrush? Did he ever get mouth ulcers when he was young? May have called them aphthous ulcers?

## 2016-12-29 ENCOUNTER — Telehealth: Payer: Self-pay | Admitting: Physician Assistant

## 2016-12-29 NOTE — Telephone Encounter (Signed)
Patient aware and appointment given for in the morning with [facility].

## 2016-12-29 NOTE — Telephone Encounter (Signed)
Covering for treating provider.   I do not see an easy alternative.   Dukes Mouthwash was goiven initially, now colchicine.   Colchicine too expensive.   Would recommend follow up to discuss and re-eval.   Laroy Apple, MD [facility] 12/29/2016, 1:42 PM

## 2016-12-30 ENCOUNTER — Encounter: Payer: Self-pay | Admitting: Family Medicine

## 2016-12-30 ENCOUNTER — Ambulatory Visit (INDEPENDENT_AMBULATORY_CARE_PROVIDER_SITE_OTHER): Payer: Medicare HMO | Admitting: Family Medicine

## 2016-12-30 VITALS — BP 125/85 | HR 81 | Temp 99.2°F | Ht 69.0 in | Wt 187.0 lb

## 2016-12-30 DIAGNOSIS — K140 Glossitis: Secondary | ICD-10-CM

## 2016-12-30 MED ORDER — COLCHICINE 0.6 MG PO TABS: 0.6000 mg | ORAL_TABLET | Freq: Every day | ORAL | 1 refills | Status: DC

## 2016-12-30 NOTE — Progress Notes (Signed)
   BP 125/85   Pulse 81   Temp 99.2 F (37.3 C) (Oral)   Ht 5\' 9"  (1.753 m)   Wt 187 lb (84.8 kg)   BMI 27.62 kg/m    Subjective:    Patient ID: Spencer Stanley, male    DOB: Mar 03, 1961, 56 y.o.   MRN: [id number]  HPI: Spencer Stanley is a 56 y.o. male presenting on 12/30/2016 for Blisters on tongue no better (treated with duke's mouthwash on 12/20/16)   HPI Tongue ulceration Patient has a tongue ulceration on the right side of his tongue that has been there for a couple weeks now. He is tried using mouthwash and also tried using colchicine but was unable to afford it and so never got it. He says the ulceration is not improving and has not decreased in size AND maybe even increased in size. He does admit that he has an extensive smoking history and did chew tobacco in the past but only for 2 years. He does not currently chew tobacco. He denies any fevers or chills or ulcerations or sores anywhere else.  Relevant past medical, surgical, family and social history reviewed and updated as indicated. Interim medical history since our last visit reviewed. Allergies and medications reviewed and updated.  Review of Systems  Constitutional: Negative for chills and fever.  HENT: Positive for mouth sores.   Respiratory: Negative for shortness of breath and wheezing.   Cardiovascular: Negative for chest pain and leg swelling.  Musculoskeletal: Negative for back pain and gait problem.  Skin: Negative for rash.  All other systems reviewed and are negative.   Per HPI unless specifically indicated above     Objective:    BP 125/85   Pulse 81   Temp 99.2 F (37.3 C) (Oral)   Ht 5\' 9"  (1.753 m)   Wt 187 lb (84.8 kg)   BMI 27.62 kg/m   Wt Readings from Last 3 Encounters:  12/30/16 187 lb (84.8 kg)  12/22/16 189 lb (85.7 kg)  10/02/16 189 lb (85.7 kg)    Physical Exam  Constitutional: He is oriented to person, place, and time. He appears well-developed and well-nourished. No distress.  HENT:    Mouth/Throat: Oral lesions (0.2 similar ulceration on the right lateral aspect of his tongue) present. No posterior oropharyngeal edema or posterior oropharyngeal erythema.  Eyes: Conjunctivae are normal. No scleral icterus.  Musculoskeletal: Normal range of motion.  Neurological: He is alert and oriented to person, place, and time. Coordination normal.  Skin: Skin is warm and dry. No rash noted. He is not diaphoretic.  Psychiatric: He has a normal mood and affect. His behavior is normal.  Nursing note and vitals reviewed.        Assessment & Plan:   Problem List Items Addressed This Visit    None    Visit Diagnoses    Tongue ulceration    -  Primary   Relevant Medications   colchicine 0.6 MG tablet   Other Relevant Orders   Ambulatory referral to ENT       Follow up plan: Return if symptoms worsen or fail to improve.  Counseling provided for all of the vaccine components Orders Placed This Encounter  Procedures  . Ambulatory referral to ENT    Caryl Pina, MD [facility] 12/30/2016, 9:04 AM

## 2017-01-02 ENCOUNTER — Ambulatory Visit (INDEPENDENT_AMBULATORY_CARE_PROVIDER_SITE_OTHER): Payer: Medicare HMO | Admitting: Family Medicine

## 2017-01-02 ENCOUNTER — Encounter: Payer: Self-pay | Admitting: Family Medicine

## 2017-01-02 VITALS — BP 111/74 | HR 88 | Temp 99.0°F | Ht 69.0 in | Wt 184.0 lb

## 2017-01-02 DIAGNOSIS — K140 Glossitis: Secondary | ICD-10-CM | POA: Diagnosis not present

## 2017-01-02 DIAGNOSIS — E782 Mixed hyperlipidemia: Secondary | ICD-10-CM | POA: Diagnosis not present

## 2017-01-02 DIAGNOSIS — E119 Type 2 diabetes mellitus without complications: Secondary | ICD-10-CM | POA: Diagnosis not present

## 2017-01-02 DIAGNOSIS — K7030 Alcoholic cirrhosis of liver without ascites: Secondary | ICD-10-CM

## 2017-01-02 LAB — BAYER DCA HB A1C WAIVED: HB A1C: 6.5 % (ref <7.0)

## 2017-01-02 MED ORDER — LIDOCAINE VISCOUS 2 % MT SOLN: 20.0000 mL | OROMUCOSAL | 0 refills | Status: DC | PRN

## 2017-01-02 MED ORDER — TRIAMCINOLONE ACETONIDE 0.1 % MT PSTE: PASTE | OROMUCOSAL | 2 refills | Status: DC

## 2017-01-02 MED ORDER — BENZOCAINE 20 % MT GEL: 1.0000 "application " | Freq: Four times a day (QID) | OROMUCOSAL | 2 refills | Status: DC | PRN

## 2017-01-02 NOTE — Progress Notes (Signed)
Subjective:  Patient ID: Spencer Stanley, male    DOB: September 10, 1960  Age: 55 y.o. MRN: [id number]  CC: Follow-up (pt here today following up on his chronic medical conditions and also isn't eating or drinking due to the tongue ulceration. Waiting on appt with [facility]. )   HPI Spencer Stanley presents forFollow-up of diabetes. Patient checks blood sugar at home.  Low 100s usually.I had asked him to decrease his metformin in hopes it would help with his diarrhea. Unfortunately his sugar went up over 300 and stayed in the 2-300 range until he resumed the second dose daily. Patient denies symptoms such as polyuria, polydipsia, excessive hunger, nausea No significant hypoglycemic spells noted. Medications reviewed. Pt reports taking them regularly without complication/adverse reaction being reported today.  Checking feet daily. Regarding his cirrhosis, Patient had to go back on his lactulose even with the Xifaxan since when he tried to discontinue it in order to prevent diarrhea he had psychotic breaks and evidence for hepatic encephalopathy. No apparent ammonia level was drawn during those episodes. However his symptoms resolved when he resumes the lactulose. He became paranoid and lost his memory.  Patient has been treated twice recently for a mouth ulcer. It started about 3 weeks ago and is getting worse in spite of treatment. A picture of that ulcer was appended 3 days ago when he was here for after hours clinic. So far no treatment has helped. He has not been able to eat due to the pain. It has not had any purulent drainage. It did not respond Magic mouthwash.  History Spencer Stanley has a past medical history of Anxiety; Cirrhosis ([place]); Esophageal varices ([place]); Lyme disease; and [facility] spotted fever.   He has a past surgical history that includes Hernia repair and Esophageal varice ligation.   His family history includes Aneurysm in his mother; Cancer in his father and mother; Dementia in his father;  Diabetes in his sister; Stanley failure in his father.He reports that he has been smoking.  He has a 33.00 pack-year smoking history. He quit smokeless tobacco use about 37 years ago. His smokeless tobacco use included Chew. He reports that he does not drink alcohol or use drugs.  Current Outpatient Prescriptions on File Prior to Visit  Medication Sig Dispense Refill  . albuterol (PROVENTIL HFA;VENTOLIN HFA) 108 (90 BASE) MCG/ACT inhaler Inhale 2 puffs into the lungs every 6 (six) hours as needed for wheezing or shortness of breath. 18 g 3  . atorvastatin (LIPITOR) 10 MG tablet TAKE 1 TABLET EVERY DAY FOR CHOLESTEROL 90 tablet 1  . CALCIUM-MAGNESIUM-ZINC PO Take 1 capsule by mouth 2 (two) times daily.    . ciclopirox (LOPROX) 0.77 % cream APPLY  TOPICALLY TWICE DAILY 90 g 2  . colchicine 0.6 MG tablet Take 1 tablet (0.6 mg total) by mouth daily. Take for aphthous ulcers 30 tablet 1  . Diphenhyd-Hydrocort-Nystatin (FIRST-DUKES MOUTHWASH) SUSP Use as directed 1 Units in the mouth or throat 4 (four) times daily as needed. 240 mL 0  . FLUoxetine (PROZAC) 20 MG capsule Take 1 capsule (20 mg total) by mouth daily. 90 capsule 3  . Lancets (ACCU-CHEK MULTICLIX) lancets Check blood sugar three times a day 102 each 12  . metFORMIN (GLUCOPHAGE-XR) 750 MG 24 hr tablet Take 1 tablet (750 mg total) by mouth daily with breakfast. 90 tablet 1  . minocycline (MINOCIN,DYNACIN) 100 MG capsule TAKE 1 CAPSULE TWICE DAILY 180 capsule 1  . multivitamin (ONE-A-DAY MEN'S) TABS tablet Take 1  tablet by mouth daily.    . pantoprazole (PROTONIX) 40 MG tablet TAKE 1 TABLET EVERY DAY 90 tablet 1  . rifaximin (XIFAXAN) 550 MG TABS tablet Take 1 tablet (550 mg total) by mouth 2 (two) times daily. 60 tablet 5  . spironolactone (ALDACTONE) 25 MG tablet TAKE 1 TABLET EVERY DAY 90 tablet 1  . tadalafil (CIALIS) 20 MG tablet Take 0.5-1 tablets (10-20 mg total) by mouth every other day as needed for erectile dysfunction. 15 tablet 4    No current facility-administered medications on file prior to visit.     ROS Review of Systems  Constitutional: Negative for chills, diaphoresis, fever and unexpected weight change.  HENT: Positive for mouth sores (see history of present illness). Negative for congestion, hearing loss, rhinorrhea and sore throat.   Eyes: Negative for visual disturbance.  Respiratory: Negative for cough and shortness of breath.   Cardiovascular: Negative for chest pain.  Gastrointestinal: Negative for abdominal pain, constipation and diarrhea.  Genitourinary: Negative for dysuria and flank pain.  Musculoskeletal: Negative for arthralgias and joint swelling.  Skin: Negative for rash.  Neurological: Negative for dizziness and headaches.  Psychiatric/Behavioral: Positive for behavioral problems and confusion (when not taking his lactulose). Negative for dysphoric mood and sleep disturbance.    Objective:  BP 111/74   Pulse 88   Temp 99 F (37.2 C) (Oral)   Ht '5\' 9"'  (1.753 m)   Wt 184 lb (83.5 kg)   BMI 27.17 kg/m   BP Readings from Last 3 Encounters:  01/02/17 111/74  12/30/16 125/85  12/22/16 108/70    Wt Readings from Last 3 Encounters:  01/02/17 184 lb (83.5 kg)  12/30/16 187 lb (84.8 kg)  12/22/16 189 lb (85.7 kg)     Physical Exam  Constitutional: He is oriented to person, place, and time. He appears well-developed and well-nourished. No distress.  HENT:  Head: Normocephalic and atraumatic.  Right Ear: External ear normal.  Left Ear: External ear normal.  Nose: Nose normal.  There is a 3 x 7 mm approximate ulceration at the right lateral margin of the tongue approximately 2 cm from the tip. No photo was taken since it is essentially unchanged from 3 days ago. See Dr. Merita Norton note for a photo from that visit. There is no sign of infection including erythema or purulence. There is just a slight amount of slough at the base but there is granulation as well  Eyes: Pupils are  equal, round, and reactive to light. Conjunctivae and EOM are normal.  Neck: Normal range of motion. Neck supple. No thyromegaly present.  Cardiovascular: Normal rate, regular rhythm and normal Stanley sounds.   No murmur heard. Pulmonary/Chest: Effort normal and breath sounds normal. No respiratory distress. He has no wheezes. He has no rales.  Abdominal: Soft. Bowel sounds are normal. He exhibits no distension. There is no tenderness.  Lymphadenopathy:    He has no cervical adenopathy.  Neurological: He is alert and oriented to person, place, and time. He has normal reflexes.  Skin: Skin is warm and dry.  Psychiatric: He has a normal mood and affect. His behavior is normal. Judgment and thought content normal.    A1c today equal 6.5    Assessment & Plan:   Spencer Stanley was seen today for follow-up.  Diagnoses and all orders for this visit:  Type 2 diabetes mellitus without complication, without long-term current use of insulin (HCC) -     CBC with Differential/Platelet -  CMP14+EGFR -     Bayer DCA Hb Y0P Waived  Alcoholic cirrhosis of liver without ascites (HCC) -     Ammonia  Mixed hyperlipidemia -     Lipid panel  Ulcerated tongue  Other orders -     Discontinue: benzocaine (HURRICAINE) 20 % GEL; Use as directed 1 application in the mouth or throat 4 (four) times daily as needed. Onto tongue ulcer for pain relief -     triamcinolone (KENALOG) 0.1 % paste; Apply to mouth ulcer twice daily -     lidocaine (XYLOCAINE) 2 % solution; Use as directed 20 mLs in the mouth or throat as needed for mouth pain.      I have discontinued Spencer Stanley benzocaine. I am also having him start on triamcinolone and lidocaine. Additionally, I am having him maintain his multivitamin, CALCIUM-MAGNESIUM-ZINC PO, albuterol, rifaximin, ciclopirox, FLUoxetine, metFORMIN, tadalafil, atorvastatin, pantoprazole, accu-chek multiclix, spironolactone, minocycline, FIRST-DUKES MOUTHWASH, and  colchicine.  Meds ordered this encounter  Medications  . DISCONTD: benzocaine (HURRICAINE) 20 % GEL    Sig: Use as directed 1 application in the mouth or throat 4 (four) times daily as needed. Onto tongue ulcer for pain relief    Dispense:  28.4 g    Refill:  2  . triamcinolone (KENALOG) 0.1 % paste    Sig: Apply to mouth ulcer twice daily    Dispense:  10 g    Refill:  2  . lidocaine (XYLOCAINE) 2 % solution    Sig: Use as directed 20 mLs in the mouth or throat as needed for mouth pain.    Dispense:  100 mL    Refill:  0   Patient was given prescription for viscous lidocaine. That should allow him to eat without pain. Kenalog paste should promote healing. Without signs of infection I believe his usual dose of minocycline should be more than enough coverage at this time. Meanwhile as time passes if it doesn't improve he will go ahead with his ENT referral.  Follow-up: Return in about 3 months (around 04/04/2017), or if symptoms worsen or fail to improve.  Claretta Fraise, M.D.

## 2017-01-03 ENCOUNTER — Other Ambulatory Visit: Payer: Self-pay | Admitting: Family Medicine

## 2017-01-03 LAB — CBC WITH DIFFERENTIAL/PLATELET
BASOS ABS: 0.1 10*3/uL (ref 0.0–0.2)
BASOS: 1 %
EOS (ABSOLUTE): 0.4 10*3/uL (ref 0.0–0.4)
Eos: 7 %
HEMATOCRIT: 42.4 % (ref 37.5–51.0)
Hemoglobin: 14.8 g/dL (ref 13.0–17.7)
IMMATURE GRANS (ABS): 0 10*3/uL (ref 0.0–0.1)
Immature Granulocytes: 0 %
LYMPHS ABS: 1.2 10*3/uL (ref 0.7–3.1)
Lymphs: 22 %
MCH: 30.3 pg (ref 26.6–33.0)
MCHC: 34.9 g/dL (ref 31.5–35.7)
MCV: 87 fL (ref 79–97)
MONOCYTES: 12 %
Monocytes Absolute: 0.6 10*3/uL (ref 0.1–0.9)
NEUTROS ABS: 3.2 10*3/uL (ref 1.4–7.0)
Neutrophils: 58 %
PLATELETS: 91 10*3/uL — AB (ref 150–379)
RBC: 4.89 x10E6/uL (ref 4.14–5.80)
RDW: 14.3 % (ref 12.3–15.4)
WBC: 5.4 10*3/uL (ref 3.4–10.8)

## 2017-01-03 LAB — LIPID PANEL
CHOL/HDL RATIO: 3.8 ratio (ref 0.0–5.0)
Cholesterol, Total: 127 mg/dL (ref 100–199)
HDL: 33 mg/dL — AB (ref >39)
LDL Calculated: 73 mg/dL (ref 0–99)
Triglycerides: 107 mg/dL (ref 0–149)
VLDL Cholesterol Cal: 21 mg/dL (ref 5–40)

## 2017-01-03 LAB — CMP14+EGFR
A/G RATIO: 1.5 (ref 1.2–2.2)
ALBUMIN: 4.1 g/dL (ref 3.5–5.5)
ALT: 17 IU/L (ref 0–44)
AST: 28 IU/L (ref 0–40)
Alkaline Phosphatase: 121 IU/L — ABNORMAL HIGH (ref 39–117)
BILIRUBIN TOTAL: 1.6 mg/dL — AB (ref 0.0–1.2)
BUN / CREAT RATIO: 15 (ref 9–20)
BUN: 13 mg/dL (ref 6–24)
CALCIUM: 9.5 mg/dL (ref 8.7–10.2)
CHLORIDE: 105 mmol/L (ref 96–106)
CO2: 21 mmol/L (ref 20–29)
Creatinine, Ser: 0.87 mg/dL (ref 0.76–1.27)
GFR, EST AFRICAN AMERICAN: 111 mL/min/{1.73_m2} (ref >59)
GFR, EST NON AFRICAN AMERICAN: 96 mL/min/{1.73_m2} (ref >59)
GLOBULIN, TOTAL: 2.7 g/dL (ref 1.5–4.5)
Glucose: 108 mg/dL — ABNORMAL HIGH (ref 65–99)
POTASSIUM: 4.6 mmol/L (ref 3.5–5.2)
SODIUM: 141 mmol/L (ref 134–144)
TOTAL PROTEIN: 6.8 g/dL (ref 6.0–8.5)

## 2017-01-03 LAB — AMMONIA: AMMONIA: 152 ug/dL — AB (ref 27–102)

## 2017-01-23 DIAGNOSIS — K7290 Hepatic failure, unspecified without coma: Secondary | ICD-10-CM | POA: Diagnosis not present

## 2017-01-23 DIAGNOSIS — K146 Glossodynia: Secondary | ICD-10-CM | POA: Insufficient documentation

## 2017-03-01 ENCOUNTER — Other Ambulatory Visit: Payer: Self-pay | Admitting: Family Medicine

## 2017-03-20 ENCOUNTER — Ambulatory Visit (INDEPENDENT_AMBULATORY_CARE_PROVIDER_SITE_OTHER): Payer: Medicare HMO | Admitting: *Deleted

## 2017-03-20 ENCOUNTER — Encounter: Payer: Self-pay | Admitting: *Deleted

## 2017-03-20 DIAGNOSIS — Z23 Encounter for immunization: Secondary | ICD-10-CM | POA: Diagnosis not present

## 2017-03-20 DIAGNOSIS — Z0000 Encounter for general adult medical examination without abnormal findings: Secondary | ICD-10-CM

## 2017-03-20 NOTE — Patient Instructions (Signed)
Mr. Aguinaga , Thank you for taking time to come for your Medicare Wellness Visit. I appreciate your ongoing commitment to your health goals. Please review the following plan we discussed and let me know if I can assist you in the future.   These are the goals we discussed: Exercise for at least 150 minutes a week.  Ask you eye doctor to send [place] a copy of your next eye exam report.  I will request your colonoscopy records.    This is a list of the screening recommended for you and due dates:  Health Maintenance  Topic Date Due  .  Hepatitis C: One time screening is recommended by Center for Disease Control  (CDC) for  adults born from [birth year] through 1966.   1960/06/23  . Complete foot exam   11/07/1969  . HIV Screening  11/08/1974  . Eye exam for diabetics  06/15/2016  . Colon Cancer Screening  06/15/2016  . Urine Protein Check  02/24/2017  . Hemoglobin A1C  07/05/2017  . Pneumococcal vaccine (2) 03/20/2022  . Tetanus Vaccine  08/24/2022  . Flu Shot  Completed    Pneumococcal Vaccine, Polyvalent solution for injection What is this medicine? PNEUMOCOCCAL VACCINE, POLYVALENT (NEU mo KOK al vak SEEN, pol ee VEY luhnt) is a vaccine to prevent pneumococcus bacteria infection. These bacteria are a major cause of ear infections, Strep throat infections, and serious pneumonia, meningitis, or blood infections worldwide. These vaccines help the body to produce antibodies (protective substances) that help your body defend against these bacteria. This vaccine is recommended for people 30 years of age and older with health problems. It is also recommended for all adults over 65 years old. This vaccine will not treat an infection. This medicine may be used for other purposes; ask your health care provider or pharmacist if you have questions. COMMON BRAND NAME(S): Pneumovax 23 What should I tell my health care provider before I take this medicine? They need to know if you have any of these  conditions: -bleeding problems -bone marrow or organ transplant -cancer, Hodgkin's disease -fever -infection -immune system problems -low platelet count in the blood -seizures -an unusual or allergic reaction to pneumococcal vaccine, diphtheria toxoid, other vaccines, latex, other medicines, foods, dyes, or preservatives -pregnant or trying to get pregnant -breast-feeding How should I use this medicine? This vaccine is for injection into a muscle or under the skin. It is given by a health care professional. A copy of Vaccine Information Statements will be given before each vaccination. Read this sheet carefully each time. The sheet may change frequently. Talk to your pediatrician regarding the use of this medicine in children. While this drug may be prescribed for children as young as 44 years of age for selected conditions, precautions do apply. Overdosage: If you think you have taken too much of this medicine contact a poison control center or emergency room at once. NOTE: This medicine is only for you. Do not share this medicine with others. What if I miss a dose? It is important not to miss your dose. Call your doctor or health care professional if you are unable to keep an appointment. What may interact with this medicine? -medicines for cancer chemotherapy -medicines that suppress your immune function -medicines that treat or prevent blood clots like warfarin, enoxaparin, and dalteparin -steroid medicines like prednisone or cortisone This list may not describe all possible interactions. Give your health care provider a list of all the medicines, herbs, non-prescription drugs, or dietary  supplements you use. Also tell them if you smoke, drink alcohol, or use illegal drugs. Some items may interact with your medicine. What should I watch for while using this medicine? Mild fever and pain should go away in 3 days or less. Report any unusual symptoms to your doctor or health care  professional. What side effects may I notice from receiving this medicine? Side effects that you should report to your doctor or health care professional as soon as possible: -allergic reactions like skin rash, itching or hives, swelling of the face, lips, or tongue -breathing problems -confused -fever over 102 degrees F -pain, tingling, numbness in the hands or feet -seizures -unusual bleeding or bruising -unusual muscle weakness Side effects that usually do not require medical attention (report to your doctor or health care professional if they continue or are bothersome): -aches and pains -diarrhea -fever of 102 degrees F or less -headache -irritable -loss of appetite -pain, tender at site where injected -trouble sleeping This list may not describe all possible side effects. Call your doctor for medical advice about side effects. You may report side effects to FDA at 1-800-FDA-1088. Where should I keep my medicine? This does not apply. This vaccine is given in a clinic, pharmacy, doctor's office, or other health care setting and will not be stored at home. NOTE: This sheet is a summary. It may not cover all possible information. If you have questions about this medicine, talk to your doctor, pharmacist, or health care provider.  2018 Elsevier/Gold Standard (2007-12-06 14:32:37)

## 2017-03-20 NOTE — Progress Notes (Signed)
Subjective:   Spencer Stanley is a 56 y.o. male who presents for an Initial Medicare Annual Wellness Visit. Spencer Stanley lives with his fiancee. He does not have any children. He lived in [place] prior to moving to [place].   Review of Systems  Health is about the same as last year.   GI: Followed by liver specialist at [facility] for cirrhosis  Cardiac Risk Factors include: advanced age (>41men, >93 women);dyslipidemia;male gender;smoking/ tobacco exposure;sedentary lifestyle;diabetes mellitus  Other systems negative today   Objective:    Today's Vitals   03/20/17 1453 03/20/17 1457  BP: (!) 73/47 (!) 93/53  Pulse: 76 75  Weight: 181 lb (82.1 kg)   Height: 5\' 9"  (1.753 m)    Body mass index is 26.73 kg/m.  Current Medications (verified) Outpatient Encounter Medications as of 03/20/2017  Medication Sig  . albuterol (PROVENTIL HFA;VENTOLIN HFA) 108 (90 BASE) MCG/ACT inhaler Inhale 2 puffs into the lungs every 6 (six) hours as needed for wheezing or shortness of breath.  Marland Kitchen atorvastatin (LIPITOR) 10 MG tablet TAKE 1 TABLET EVERY DAY FOR CHOLESTEROL  . CALCIUM-MAGNESIUM-ZINC PO Take 1 capsule by mouth 2 (two) times daily.  . ciclopirox (LOPROX) 0.77 % cream APPLY  TOPICALLY TWICE DAILY  . FLUoxetine (PROZAC) 20 MG capsule Take 1 capsule (20 mg total) by mouth daily.  . Lancets (ACCU-CHEK MULTICLIX) lancets Check blood sugar three times a day  . metFORMIN (GLUCOPHAGE-XR) 750 MG 24 hr tablet Take 1 tablet (750 mg total) by mouth daily with breakfast.  . minocycline (MINOCIN,DYNACIN) 100 MG capsule TAKE 1 CAPSULE TWICE DAILY  . multivitamin (ONE-A-DAY MEN'S) TABS tablet Take 1 tablet by mouth daily.  . pantoprazole (PROTONIX) 40 MG tablet TAKE 1 TABLET (40 MG TOTAL) BY MOUTH DAILY.  . rifaximin (XIFAXAN) 550 MG TABS tablet Take 1 tablet (550 mg total) by mouth 2 (two) times daily.  Marland Kitchen spironolactone (ALDACTONE) 25 MG tablet TAKE 1 TABLET EVERY DAY  . tadalafil (CIALIS) 20 MG tablet Take 0.5-1  tablets (10-20 mg total) by mouth every other day as needed for erectile dysfunction.  . triamcinolone (KENALOG) 0.1 % paste Apply to mouth ulcer twice daily  . colchicine 0.6 MG tablet Take 1 tablet (0.6 mg total) by mouth daily. Take for aphthous ulcers  . Diphenhyd-Hydrocort-Nystatin (FIRST-DUKES MOUTHWASH) SUSP Use as directed 1 Units in the mouth or throat 4 (four) times daily as needed.  . lidocaine (XYLOCAINE) 2 % solution Use as directed 20 mLs in the mouth or throat as needed for mouth pain.   No facility-administered encounter medications on file as of 03/20/2017.     Allergies (verified) Other   History: Past Medical History:  Diagnosis Date  . Anxiety   . Cirrhosis ([place])   . Esophageal varices ([place])   . Lyme disease   . Rocky Mountain spotted fever    Past Surgical History:  Procedure Laterality Date  . ESOPHAGEAL VARICE LIGATION     TIPS  . HERNIA REPAIR     Family History  Problem Relation Age of Onset  . Aneurysm Mother   . Cancer Mother        breast  . Dementia Father   . Cancer Father        brain tumor  . Heart failure Father   . Diabetes Sister    Social History   Occupational History  . Not on file  Tobacco Use  . Smoking status: Current Every Day Smoker    Packs/day: 1.00  Years: 33.00    Pack years: 33.00  . Smokeless tobacco: Former Systems developer    Types: Chew    Quit date: 1981  Substance and Sexual Activity  . Alcohol use: No    Comment: no alcohol since 2013  . Drug use: No  . Sexual activity: Yes   Tobacco Counseling Ready to quit: Yes Counseling given: Yes   Activities of Daily Living In your present state of health, do you have any difficulty performing the following activities: 03/20/2017  Hearing? N  Vision? N  Difficulty concentrating or making decisions? Y  Comment Has noticed a decrease in memory since developing liver problems  Walking or climbing stairs? N  Dressing or bathing? N  Doing errands, shopping? N  Preparing  Food and eating ? N  Using the Toilet? N  In the past six months, have you accidently leaked urine? N  Do you have problems with loss of bowel control? N  Managing your Medications? N  Managing your Finances? N  Housekeeping or managing your Housekeeping? N  Some recent data might be hidden    Immunizations and Health Maintenance Immunization History  Administered Date(s) Administered  . Influenza,inj,Quad PF,6+ Mos 06/19/2014, 03/26/2015, 02/25/2016, 03/20/2017  . Pneumococcal Polysaccharide-23 03/20/2017   Health Maintenance Due  Topic Date Due  . Hepatitis C Screening  09/18/1960  . FOOT EXAM  11/07/1969  . HIV Screening  11/08/1974  . OPHTHALMOLOGY EXAM  06/15/2016  . COLONOSCOPY  06/15/2016  . URINE MICROALBUMIN  02/24/2017    Patient Care Team: Claretta Fraise, MD as PCP - General (Family Medicine) Lenna Sciara, MD as Referring Physician (Gastroenterology) Izora Gala, MD as Consulting Physician (Otolaryngology) Venetia Night, OD-optometry  No hospitalizations, ER visits, or surgeries this past year.     Assessment:   This is a routine wellness examination for Spencer Stanley.   Hearing/Vision screen No deficit noted. Yearly eye exam due.   Dietary issues and exercise activities discussed: Current Exercise Habits: The patient does not participate in regular exercise at present, Exercise limited by: None identified  Goals Exercise for at least 150 minutes a week  Depression Screen PHQ 2/9 Scores 01/02/2017 12/30/2016 10/02/2016 09/01/2016  PHQ - 2 Score 2 1 2 2   PHQ- 9 Score 15 - 10 10    Fall Risk Fall Risk  10/02/2016 02/25/2016 11/23/2015 08/25/2015 05/21/2015  Falls in the past year? No Yes No No Yes  Number falls in past yr: - 1 - - 1  Injury with Fall? - No - - No    Cognitive Function: MMSE - Mini Mental State Exam 03/20/2017  Orientation to time 5  Orientation to Place 5  Registration 3  Attention/ Calculation 5  Recall 3  Language- name 2 objects 2    Language- repeat 1  Language- follow 3 step command 3  Language- read & follow direction 1  Write a sentence 1  Copy design 1  Total score 30    normal    Screening Tests Health Maintenance  Topic Date Due  . Hepatitis C Screening  July 03, 1960  . FOOT EXAM  11/07/1969  . HIV Screening  11/08/1974  . OPHTHALMOLOGY EXAM  06/15/2016  . COLONOSCOPY  06/15/2016  . URINE MICROALBUMIN  02/24/2017  . HEMOGLOBIN A1C  07/05/2017  . PNEUMOCOCCAL POLYSACCHARIDE VACCINE (2) 03/20/2022  . TETANUS/TDAP  08/24/2022  . INFLUENZA VACCINE  Completed        Plan:  Flu shot given today Pneumovax given today Schedule yearly eye  exam. Have them send a report to our office.  I will send records release to [facility] for you colonoscopy report.  Keep f/u with PCP.   I have personally reviewed and noted the following in the patient's chart:   . Medical and social history . Use of alcohol, tobacco or illicit drugs  . Current medications and supplements . Functional ability and status . Nutritional status . Physical activity . Advanced directives . List of other physicians . Hospitalizations, surgeries, and ER visits in previous 12 months . Vitals . Screenings to include cognitive, depression, and falls . Referrals and appointments  In addition, I have reviewed and discussed with patient certain preventive protocols, quality metrics, and best practice recommendations. A written personalized care plan for preventive services as well as general preventive health recommendations were provided to patient.     Chong Sicilian, RN   03/20/2017

## 2017-04-04 ENCOUNTER — Encounter: Payer: Self-pay | Admitting: Family Medicine

## 2017-04-04 ENCOUNTER — Ambulatory Visit: Payer: Medicare HMO | Admitting: Family Medicine

## 2017-04-04 VITALS — BP 83/49 | HR 84 | Temp 98.0°F | Ht 69.0 in | Wt 181.0 lb

## 2017-04-04 DIAGNOSIS — E119 Type 2 diabetes mellitus without complications: Secondary | ICD-10-CM | POA: Diagnosis not present

## 2017-04-04 DIAGNOSIS — E782 Mixed hyperlipidemia: Secondary | ICD-10-CM

## 2017-04-04 DIAGNOSIS — K7030 Alcoholic cirrhosis of liver without ascites: Secondary | ICD-10-CM

## 2017-04-04 LAB — BAYER DCA HB A1C WAIVED: HB A1C (BAYER DCA - WAIVED): 5.6 % (ref <7.0)

## 2017-04-04 MED ORDER — SPIRONOLACTONE 25 MG PO TABS: 25.0000 mg | ORAL_TABLET | Freq: Every day | ORAL | 1 refills | Status: DC

## 2017-04-04 MED ORDER — MINOCYCLINE HCL 100 MG PO CAPS: 100.0000 mg | ORAL_CAPSULE | Freq: Two times a day (BID) | ORAL | 1 refills | Status: DC

## 2017-04-04 MED ORDER — METFORMIN HCL ER 750 MG PO TB24: 750.0000 mg | ORAL_TABLET | Freq: Every day | ORAL | 1 refills | Status: DC

## 2017-04-04 MED ORDER — ACCU-CHEK MULTICLIX LANCETS MISC: 12 refills | Status: DC

## 2017-04-04 NOTE — Progress Notes (Signed)
Subjective:  Patient ID: Spencer Stanley,  male    DOB: 1960-11-07  Age: 56 y.o.    CC: Diabetes (pt here today for routine follow up of his chronic medical conditions and wants to discuss Lisinopril for his kidneys.)   HPI [name] presents for Follow-up of diabetes. Patient does check blood sugar at home. Readings run between 120-130 fasting and 100-1 50 postprandial Patient denies symptoms such as polyuria, polydipsia, excessive hunger, nausea No significant hypoglycemic spells noted. Medications reviewed. Pt reports taking them regularly. Pt. denies complication/adverse reaction today.  He continues to be under the care of her pathology at [facility] for his cirrhosis.  He has stabilized and has not had any hepatic encephalopathy or other symptoms since getting on to a stable combination of Xifaxan plus lactulose.  He is concerned about taking care of his kidneys.  He wonders why he is not taking an ACE inhibitor such as lisinopril.  However he notes that his blood pressure does stay quite low.  He continues to take his spironolactone to help keep his ascites under control.  He denies having had any distention recently. Patient in for follow-up of elevated cholesterol. Doing well without complaints on current medication. Denies side effects of statin including myalgia and arthralgia and nausea. Also in today for liver function testing. Currently no chest pain, shortness of breath or other cardiovascular related symptoms noted.  History [name] has a past medical history of Anxiety, Cirrhosis ([place]), Esophageal varices ([place]), Lyme disease, and Rocky Mountain spotted fever.   He has a past surgical history that includes Hernia repair and Esophageal varice ligation.   His family history includes Aneurysm in his mother; Cancer in his father and mother; Dementia in his father; Diabetes in his sister; Heart failure in his father.He reports that he has been smoking.  He has a  33.00 pack-year smoking history. He quit smokeless tobacco use about 37 years ago. His smokeless tobacco use included chew. He reports that he does not drink alcohol or use drugs.  Current Outpatient Medications on File Prior to Visit  Medication Sig Dispense Refill  . albuterol (PROVENTIL HFA;VENTOLIN HFA) 108 (90 BASE) MCG/ACT inhaler Inhale 2 puffs into the lungs every 6 (six) hours as needed for wheezing or shortness of breath. 18 g 3  . atorvastatin (LIPITOR) 10 MG tablet TAKE 1 TABLET EVERY DAY FOR CHOLESTEROL 90 tablet 1  . CALCIUM-MAGNESIUM-ZINC PO Take 1 capsule by mouth 2 (two) times daily.    . ciclopirox (LOPROX) 0.77 % cream APPLY  TOPICALLY TWICE DAILY 90 g 2  . colchicine 0.6 MG tablet Take 1 tablet (0.6 mg total) by mouth daily. Take for aphthous ulcers 30 tablet 1  . FLUoxetine (PROZAC) 20 MG capsule Take 1 capsule (20 mg total) by mouth daily. 90 capsule 3  . multivitamin (ONE-A-DAY MEN'S) TABS tablet Take 1 tablet by mouth daily.    . pantoprazole (PROTONIX) 40 MG tablet TAKE 1 TABLET (40 MG TOTAL) BY MOUTH DAILY. 90 tablet 1  . rifaximin (XIFAXAN) 550 MG TABS tablet Take 1 tablet (550 mg total) by mouth 2 (two) times daily. 60 tablet 5  . tadalafil (CIALIS) 20 MG tablet Take 0.5-1 tablets (10-20 mg total) by mouth every other day as needed for erectile dysfunction. 15 tablet 4   No current facility-administered medications on file prior to visit.     ROS Review of Systems  Constitutional: Negative for chills, diaphoresis, fever and unexpected weight change.  HENT:  Negative for congestion, hearing loss, rhinorrhea and sore throat.   Eyes: Negative for visual disturbance.  Respiratory: Negative for cough and shortness of breath.   Cardiovascular: Negative for chest pain.  Gastrointestinal: Negative for abdominal pain, constipation and diarrhea.  Genitourinary: Negative for dysuria and flank pain.  Musculoskeletal: Negative for arthralgias and joint swelling.  Skin:  Negative for rash.  Neurological: Negative for dizziness and headaches.  Psychiatric/Behavioral: Negative for dysphoric mood and sleep disturbance.    Objective:  BP (!) 83/49   Pulse 84   Temp 98 F (36.7 C) (Oral)   Ht '5\' 9"'  (1.753 m)   Wt 181 lb (82.1 kg)   BMI 26.73 kg/m   BP Readings from Last 3 Encounters:  04/04/17 (!) 83/49  03/20/17 (!) 93/53  01/02/17 111/74    Wt Readings from Last 3 Encounters:  04/04/17 181 lb (82.1 kg)  03/20/17 181 lb (82.1 kg)  01/02/17 184 lb (83.5 kg)     Physical Exam  Constitutional: He is oriented to person, place, and time. He appears well-developed and well-nourished. No distress.  HENT:  Head: Normocephalic and atraumatic.  Right Ear: External ear normal.  Left Ear: External ear normal.  Nose: Nose normal.  Mouth/Throat: Oropharynx is clear and moist.  Eyes: Conjunctivae and EOM are normal. Pupils are equal, round, and reactive to light.  Neck: Normal range of motion. Neck supple. No thyromegaly present.  Cardiovascular: Normal rate, regular rhythm and normal heart sounds.  No murmur heard. Pulmonary/Chest: Effort normal and breath sounds normal. No respiratory distress. He has no wheezes. He has no rales.  Abdominal: Soft. Bowel sounds are normal. He exhibits no distension. There is no tenderness.  Lymphadenopathy:    He has no cervical adenopathy.  Neurological: He is alert and oriented to person, place, and time. He has normal reflexes.  Skin: Skin is warm and dry.  Psychiatric: He has a normal mood and affect. His behavior is normal. Judgment and thought content normal.    Diabetic Foot Exam - Simple   No data filed        Assessment & Plan:   Aurther was seen today for diabetes.  Diagnoses and all orders for this visit:  Type 2 diabetes mellitus without complication, without long-term current use of insulin (HCC)  Alcoholic cirrhosis of liver without ascites (HCC) -     CMP14+EGFR -     CBC with  Differential/Platelet -     Bayer DCA Hb A1c Waived -     Uric acid -     Ammonia  Mixed hyperlipidemia  Other orders -     Lancets (ACCU-CHEK MULTICLIX) lancets; Check blood sugar three times a day -     metFORMIN (GLUCOPHAGE-XR) 750 MG 24 hr tablet; Take 1 tablet (750 mg total) by mouth daily with breakfast. -     minocycline (MINOCIN,DYNACIN) 100 MG capsule; Take 1 capsule (100 mg total) by mouth 2 (two) times daily. -     spironolactone (ALDACTONE) 25 MG tablet; Take 1 tablet (25 mg total) by mouth daily.   I have discontinued Ashok Sawaya FIRST-DUKES MOUTHWASH, triamcinolone, and lidocaine. I have also changed his minocycline and spironolactone. Additionally, I am having him maintain his multivitamin, CALCIUM-MAGNESIUM-ZINC PO, albuterol, rifaximin, ciclopirox, FLUoxetine, tadalafil, colchicine, pantoprazole, atorvastatin, accu-chek multiclix, and metFORMIN.  Meds ordered this encounter  Medications  . Lancets (ACCU-CHEK MULTICLIX) lancets    Sig: Check blood sugar three times a day    Dispense:  102 each  Refill:  12    Dx: E11.9  . metFORMIN (GLUCOPHAGE-XR) 750 MG 24 hr tablet    Sig: Take 1 tablet (750 mg total) by mouth daily with breakfast.    Dispense:  90 tablet    Refill:  1  . minocycline (MINOCIN,DYNACIN) 100 MG capsule    Sig: Take 1 capsule (100 mg total) by mouth 2 (two) times daily.    Dispense:  180 capsule    Refill:  1  . spironolactone (ALDACTONE) 25 MG tablet    Sig: Take 1 tablet (25 mg total) by mouth daily.    Dispense:  90 tablet    Refill:  1   Patient understands that his blood pressure is far too low to safely add lisinopril.  That would simply because his kidneys to shut down due to poor perfusion  Follow-up: Return in about 3 months (around 07/05/2017).  Claretta Fraise, M.D.

## 2017-04-05 LAB — CMP14+EGFR
ALK PHOS: 114 IU/L (ref 39–117)
ALT: 25 IU/L (ref 0–44)
AST: 36 IU/L (ref 0–40)
Albumin/Globulin Ratio: 1.5 (ref 1.2–2.2)
Albumin: 3.7 g/dL (ref 3.5–5.5)
BUN/Creatinine Ratio: 10 (ref 9–20)
BUN: 9 mg/dL (ref 6–24)
Bilirubin Total: 1 mg/dL (ref 0.0–1.2)
CALCIUM: 9.3 mg/dL (ref 8.7–10.2)
CO2: 20 mmol/L (ref 20–29)
CREATININE: 0.87 mg/dL (ref 0.76–1.27)
Chloride: 109 mmol/L — ABNORMAL HIGH (ref 96–106)
GFR calc Af Amer: 111 mL/min/{1.73_m2} (ref >59)
GFR calc non Af Amer: 96 mL/min/{1.73_m2} (ref >59)
GLUCOSE: 112 mg/dL — AB (ref 65–99)
Globulin, Total: 2.4 g/dL (ref 1.5–4.5)
Potassium: 4.5 mmol/L (ref 3.5–5.2)
Sodium: 143 mmol/L (ref 134–144)
Total Protein: 6.1 g/dL (ref 6.0–8.5)

## 2017-04-05 LAB — CBC WITH DIFFERENTIAL/PLATELET
Basophils Absolute: 0 10*3/uL (ref 0.0–0.2)
Basos: 1 %
EOS (ABSOLUTE): 0.2 10*3/uL (ref 0.0–0.4)
Eos: 5 %
HEMOGLOBIN: 13.2 g/dL (ref 13.0–17.7)
Hematocrit: 39 % (ref 37.5–51.0)
IMMATURE GRANS (ABS): 0 10*3/uL (ref 0.0–0.1)
IMMATURE GRANULOCYTES: 0 %
LYMPHS: 21 %
Lymphocytes Absolute: 0.8 10*3/uL (ref 0.7–3.1)
MCH: 31.1 pg (ref 26.6–33.0)
MCHC: 33.8 g/dL (ref 31.5–35.7)
MCV: 92 fL (ref 79–97)
MONOCYTES: 12 %
MONOS ABS: 0.4 10*3/uL (ref 0.1–0.9)
NEUTROS PCT: 61 %
Neutrophils Absolute: 2.2 10*3/uL (ref 1.4–7.0)
Platelets: 87 10*3/uL — CL (ref 150–379)
RBC: 4.25 x10E6/uL (ref 4.14–5.80)
RDW: 14.3 % (ref 12.3–15.4)
WBC: 3.6 10*3/uL (ref 3.4–10.8)

## 2017-04-05 LAB — AMMONIA: AMMONIA: 91 ug/dL (ref 27–102)

## 2017-04-05 LAB — URIC ACID: URIC ACID: 3.5 mg/dL — AB (ref 3.7–8.6)

## 2017-04-19 DIAGNOSIS — Z79899 Other long term (current) drug therapy: Secondary | ICD-10-CM | POA: Diagnosis not present

## 2017-04-19 DIAGNOSIS — K7460 Unspecified cirrhosis of liver: Secondary | ICD-10-CM | POA: Diagnosis not present

## 2017-04-19 DIAGNOSIS — K7030 Alcoholic cirrhosis of liver without ascites: Secondary | ICD-10-CM | POA: Diagnosis not present

## 2017-04-19 DIAGNOSIS — K766 Portal hypertension: Secondary | ICD-10-CM | POA: Diagnosis not present

## 2017-04-19 DIAGNOSIS — E8770 Fluid overload, unspecified: Secondary | ICD-10-CM | POA: Diagnosis not present

## 2017-06-05 ENCOUNTER — Other Ambulatory Visit: Payer: Self-pay | Admitting: Family Medicine

## 2017-06-07 ENCOUNTER — Other Ambulatory Visit: Payer: Self-pay | Admitting: Family Medicine

## 2017-07-05 ENCOUNTER — Encounter: Payer: Self-pay | Admitting: Family Medicine

## 2017-07-05 ENCOUNTER — Ambulatory Visit (INDEPENDENT_AMBULATORY_CARE_PROVIDER_SITE_OTHER): Payer: Medicare HMO | Admitting: Family Medicine

## 2017-07-05 VITALS — BP 98/57 | HR 69 | Temp 97.8°F | Ht 69.0 in | Wt 181.0 lb

## 2017-07-05 DIAGNOSIS — E119 Type 2 diabetes mellitus without complications: Secondary | ICD-10-CM | POA: Diagnosis not present

## 2017-07-05 DIAGNOSIS — K7030 Alcoholic cirrhosis of liver without ascites: Secondary | ICD-10-CM | POA: Diagnosis not present

## 2017-07-05 DIAGNOSIS — K140 Glossitis: Secondary | ICD-10-CM | POA: Diagnosis not present

## 2017-07-05 DIAGNOSIS — E782 Mixed hyperlipidemia: Secondary | ICD-10-CM | POA: Diagnosis not present

## 2017-07-05 LAB — URINALYSIS
BILIRUBIN UA: NEGATIVE
Glucose, UA: NEGATIVE
Nitrite, UA: NEGATIVE
PH UA: 6 (ref 5.0–7.5)
Specific Gravity, UA: >1.03 — ABNORMAL HIGH (ref 1.005–1.030)

## 2017-07-05 LAB — BAYER DCA HB A1C WAIVED: HB A1C (BAYER DCA - WAIVED): 5.9 %

## 2017-07-05 MED ORDER — FLUOXETINE HCL 20 MG PO CAPS: 20.0000 mg | ORAL_CAPSULE | Freq: Every day | ORAL | 1 refills | Status: DC

## 2017-07-05 MED ORDER — ATORVASTATIN CALCIUM 10 MG PO TABS: ORAL_TABLET | ORAL | 1 refills | Status: DC

## 2017-07-05 MED ORDER — SPIRONOLACTONE 25 MG PO TABS: 25.0000 mg | ORAL_TABLET | Freq: Every day | ORAL | 1 refills | Status: DC

## 2017-07-05 MED ORDER — METFORMIN HCL ER 750 MG PO TB24: 750.0000 mg | ORAL_TABLET | Freq: Every day | ORAL | 1 refills | Status: DC

## 2017-07-05 MED ORDER — PANTOPRAZOLE SODIUM 40 MG PO TBEC: DELAYED_RELEASE_TABLET | ORAL | 1 refills | Status: DC

## 2017-07-05 NOTE — Patient Instructions (Signed)
Schedule your Diabetic Eye Exam!!! Diabetes and Foot Care Diabetes may cause you to have problems because of poor blood supply (circulation) to your feet and legs. This may cause the skin on your feet to become thinner, break easier, and heal more slowly. Your skin may become dry, and the skin may peel and crack. You may also have nerve damage in your legs and feet causing decreased feeling in them. You may not notice minor injuries to your feet that could lead to infections or more serious problems. Taking care of your feet is one of the most important things you can do for yourself. Follow these instructions at home:  Wear shoes at all times, even in the house. Do not go barefoot. Bare feet are easily injured.  Check your feet daily for blisters, cuts, and redness. If you cannot see the bottom of your feet, use a mirror or ask someone for help.  Wash your feet with warm water (do not use hot water) and mild soap. Then pat your feet and the areas between your toes until they are completely dry. Do not soak your feet as this can dry your skin.  Apply a moisturizing lotion or petroleum jelly (that does not contain alcohol and is unscented) to the skin on your feet and to dry, brittle toenails. Do not apply lotion between your toes.  Trim your toenails straight across. Do not dig under them or around the cuticle. File the edges of your nails with an emery board or nail file.  Do not cut corns or calluses or try to remove them with medicine.  Wear clean socks or stockings every day. Make sure they are not too tight. Do not wear knee-high stockings since they may decrease blood flow to your legs.  Wear shoes that fit properly and have enough cushioning. To break in new shoes, wear them for just a few hours a day. This prevents you from injuring your feet. Always look in your shoes before you put them on to be sure there are no objects inside.  Do not cross your legs. This may decrease the blood flow  to your feet.  If you find a minor scrape, cut, or break in the skin on your feet, keep it and the skin around it clean and dry. These areas may be cleansed with mild soap and water. Do not cleanse the area with peroxide, alcohol, or iodine.  When you remove an adhesive bandage, be sure not to damage the skin around it.  If you have a wound, look at it several times a day to make sure it is healing.  Do not use heating pads or hot water bottles. They may burn your skin. If you have lost feeling in your feet or legs, you may not know it is happening until it is too late.  Make sure your health care provider performs a complete foot exam at least annually or more often if you have foot problems. Report any cuts, sores, or bruises to your health care provider immediately. Contact a health care provider if:  You have an injury that is not healing.  You have cuts or breaks in the skin.  You have an ingrown nail.  You notice redness on your legs or feet.  You feel burning or tingling in your legs or feet.  You have pain or cramps in your legs and feet.  Your legs or feet are numb.  Your feet always feel cold. Get help right away  if:  There is increasing redness, swelling, or pain in or around a wound.  There is a red line that goes up your leg.  Pus is coming from a wound.  You develop a fever or as directed by your health care provider.  You notice a bad smell coming from an ulcer or wound. This information is not intended to replace advice given to you by your health care provider. Make sure you discuss any questions you have with your health care provider. Document Released: 04/28/2000 Document Revised: 10/07/2015 Document Reviewed: 10/08/2012 Elsevier Interactive Patient Education  2017 [employer].

## 2017-07-05 NOTE — Progress Notes (Signed)
Subjective:  Patient ID: Spencer Stanley,  male    DOB: December 20, 1960  Age: 57 y.o.    CC: No chief complaint on file.   HPI Carrie Schoonmaker presents for  follow-up of elevated cholesterol. Doing well without complaints on current medication. Denies side effects of statin including myalgia and arthralgia and nausea. Also in today for liver function testing. Currently no chest pain, shortness of breath or other cardiovascular related symptoms noted.  Follow-up of diabetes. Patient does not check blood sugar at home.  Patient denies symptoms such as polyuria, polydipsia, excessive hunger, nausea No significant hypoglycemic spells noted. Medications reviewed. Pt reports taking them regularly. Pt. denies complication/adverse reaction today.   Remains under care of GI for cirrhosis. Denies ascites, abd. Distension.Needs ammonia level.  History Ellis has a past medical history of Anxiety, Cirrhosis ([place]), Esophageal varices ([place]), Lyme disease, and Rocky Mountain spotted fever.   He has a past surgical history that includes Hernia repair and Esophageal varice ligation.   His family history includes Aneurysm in his mother; Cancer in his father and mother; Dementia in his father; Diabetes in his sister; Heart failure in his father.He reports that he has been smoking.  He has a 33.00 pack-year smoking history. He quit smokeless tobacco use about 38 years ago. His smokeless tobacco use included chew. He reports that he does not drink alcohol or use drugs.  Current Outpatient Medications on File Prior to Visit  Medication Sig Dispense Refill  . albuterol (PROVENTIL HFA;VENTOLIN HFA) 108 (90 BASE) MCG/ACT inhaler Inhale 2 puffs into the lungs every 6 (six) hours as needed for wheezing or shortness of breath. 18 g 3  . CALCIUM-MAGNESIUM-ZINC PO Take 1 capsule by mouth 2 (two) times daily.    . ciclopirox (LOPROX) 0.77 % cream APPLY  TOPICALLY TWICE DAILY 90 g 2  . colchicine 0.6 MG tablet Take 1 tablet (0.6  mg total) by mouth daily. Take for aphthous ulcers 30 tablet 1  . Lancets (ACCU-CHEK MULTICLIX) lancets Check blood sugar three times a day 102 each 12  . minocycline (MINOCIN,DYNACIN) 100 MG capsule Take 1 capsule (100 mg total) by mouth 2 (two) times daily. 180 capsule 1  . Multiple Vitamins-Minerals (ZINC PO) Take by mouth.    . multivitamin (ONE-A-DAY MEN'S) TABS tablet Take 1 tablet by mouth daily.    . rifaximin (XIFAXAN) 550 MG TABS tablet Take 1 tablet (550 mg total) by mouth 2 (two) times daily. 60 tablet 5  . tadalafil (CIALIS) 20 MG tablet Take 0.5-1 tablets (10-20 mg total) by mouth every other day as needed for erectile dysfunction. 15 tablet 4   No current facility-administered medications on file prior to visit.     ROS Review of Systems  Constitutional: Negative for chills, diaphoresis, fever and unexpected weight change.  HENT: Negative for congestion, hearing loss, rhinorrhea and sore throat.   Eyes: Negative for visual disturbance.  Respiratory: Negative for cough and shortness of breath.   Cardiovascular: Negative for chest pain.  Gastrointestinal: Negative for abdominal pain, constipation and diarrhea.  Genitourinary: Negative for dysuria and flank pain.  Musculoskeletal: Negative for arthralgias and joint swelling.  Skin: Negative for rash.  Neurological: Negative for dizziness and headaches.  Psychiatric/Behavioral: Negative for dysphoric mood and sleep disturbance.    Objective:  BP (!) 98/57   Pulse 69   Temp 97.8 F (36.6 C) (Oral)   Ht '5\' 9"'  (1.753 m)   Wt 181 lb (82.1 kg)   BMI 26.73 kg/m  BP Readings from Last 3 Encounters:  07/05/17 (!) 98/57  04/04/17 (!) 83/49  03/20/17 (!) 93/53    Wt Readings from Last 3 Encounters:  07/05/17 181 lb (82.1 kg)  04/04/17 181 lb (82.1 kg)  03/20/17 181 lb (82.1 kg)     Physical Exam  Constitutional: He is oriented to person, place, and time. He appears well-developed and well-nourished. No distress.    HENT:  Head: Normocephalic and atraumatic.  Right Ear: External ear normal.  Left Ear: External ear normal.  Nose: Nose normal.  Mouth/Throat: Oropharynx is clear and moist.  Eyes: Conjunctivae and EOM are normal. Pupils are equal, round, and reactive to light.  Neck: Normal range of motion. Neck supple. No thyromegaly present.  Cardiovascular: Normal rate, regular rhythm and normal heart sounds.  No murmur heard. Pulmonary/Chest: Effort normal and breath sounds normal. No respiratory distress. He has no wheezes. He has no rales.  Abdominal: Soft. Bowel sounds are normal. He exhibits no distension. There is no tenderness.  Lymphadenopathy:    He has no cervical adenopathy.  Neurological: He is alert and oriented to person, place, and time. He has normal reflexes.  Skin: Skin is warm and dry.  Psychiatric: He has a normal mood and affect. His behavior is normal. Judgment and thought content normal.    Diabetic Foot Exam - Simple   Simple Foot Form Diabetic Foot exam was performed with the following findings:  Yes 07/05/2017 11:00 AM  Visual Inspection No deformities, no ulcerations, no other skin breakdown bilaterally:  Yes Sensation Testing Intact to touch and monofilament testing bilaterally:  Yes Pulse Check Posterior Tibialis and Dorsalis pulse intact bilaterally:  Yes Comments       Assessment & Plan:   Diagnoses and all orders for this visit:  Type 2 diabetes mellitus without complication, without long-term current use of insulin (HCC) -     Microalbumin / creatinine urine ratio -     Urinalysis -     Bayer DCA Hb T3S Waived  Alcoholic cirrhosis of liver without ascites (HCC) -     Ammonia  Mixed hyperlipidemia -     CBC with Differential/Platelet -     CMP14+EGFR -     Lipid panel  Tongue ulceration  Other orders -     atorvastatin (LIPITOR) 10 MG tablet; TAKE 1 TABLET EVERY DAY FOR CHOLESTEROL -     FLUoxetine (PROZAC) 20 MG capsule; Take 1 capsule (20  mg total) by mouth daily. -     metFORMIN (GLUCOPHAGE-XR) 750 MG 24 hr tablet; Take 1 tablet (750 mg total) by mouth daily with breakfast. -     pantoprazole (PROTONIX) 40 MG tablet; TAKE 1 TABLET (40 MG TOTAL) BY MOUTH DAILY. -     spironolactone (ALDACTONE) 25 MG tablet; Take 1 tablet (25 mg total) by mouth daily.   I am having [name] maintain his multivitamin, CALCIUM-MAGNESIUM-ZINC PO, albuterol, rifaximin, ciclopirox, tadalafil, colchicine, accu-chek multiclix, minocycline, Multiple Vitamins-Minerals (ZINC PO), atorvastatin, FLUoxetine, metFORMIN, pantoprazole, and spironolactone.  Meds ordered this encounter  Medications  . atorvastatin (LIPITOR) 10 MG tablet    Sig: TAKE 1 TABLET EVERY DAY FOR CHOLESTEROL    Dispense:  90 tablet    Refill:  1  . FLUoxetine (PROZAC) 20 MG capsule    Sig: Take 1 capsule (20 mg total) by mouth daily.    Dispense:  90 capsule    Refill:  1  . metFORMIN (GLUCOPHAGE-XR) 750 MG 24 hr tablet  Sig: Take 1 tablet (750 mg total) by mouth daily with breakfast.    Dispense:  90 tablet    Refill:  1  . pantoprazole (PROTONIX) 40 MG tablet    Sig: TAKE 1 TABLET (40 MG TOTAL) BY MOUTH DAILY.    Dispense:  90 tablet    Refill:  1  . spironolactone (ALDACTONE) 25 MG tablet    Sig: Take 1 tablet (25 mg total) by mouth daily.    Dispense:  90 tablet    Refill:  1     Follow-up: Return in about 3 months (around 10/02/2017).  Claretta Fraise, M.D.

## 2017-07-06 LAB — CMP14+EGFR
ALK PHOS: 139 IU/L — AB (ref 39–117)
ALT: 26 IU/L (ref 0–44)
AST: 37 IU/L (ref 0–40)
Albumin/Globulin Ratio: 1.7 (ref 1.2–2.2)
Albumin: 3.9 g/dL (ref 3.5–5.5)
BUN/Creatinine Ratio: 13 (ref 9–20)
BUN: 11 mg/dL (ref 6–24)
Bilirubin Total: 1.4 mg/dL — ABNORMAL HIGH (ref 0.0–1.2)
CALCIUM: 9.4 mg/dL (ref 8.7–10.2)
CO2: 21 mmol/L (ref 20–29)
CREATININE: 0.87 mg/dL (ref 0.76–1.27)
Chloride: 105 mmol/L (ref 96–106)
GFR calc Af Amer: 111 mL/min/{1.73_m2} (ref >59)
GFR, EST NON AFRICAN AMERICAN: 96 mL/min/{1.73_m2} (ref >59)
GLOBULIN, TOTAL: 2.3 g/dL (ref 1.5–4.5)
GLUCOSE: 92 mg/dL (ref 65–99)
Potassium: 4.1 mmol/L (ref 3.5–5.2)
SODIUM: 140 mmol/L (ref 134–144)
Total Protein: 6.2 g/dL (ref 6.0–8.5)

## 2017-07-06 LAB — CBC WITH DIFFERENTIAL/PLATELET
BASOS ABS: 0.1 10*3/uL (ref 0.0–0.2)
Basos: 2 %
EOS (ABSOLUTE): 0.1 10*3/uL (ref 0.0–0.4)
EOS: 3 %
HEMATOCRIT: 42.1 % (ref 37.5–51.0)
Hemoglobin: 14.1 g/dL (ref 13.0–17.7)
IMMATURE GRANS (ABS): 0 10*3/uL (ref 0.0–0.1)
IMMATURE GRANULOCYTES: 0 %
LYMPHS ABS: 1.1 10*3/uL (ref 0.7–3.1)
LYMPHS: 27 %
MCH: 29.7 pg (ref 26.6–33.0)
MCHC: 33.5 g/dL (ref 31.5–35.7)
MCV: 89 fL (ref 79–97)
MONOCYTES: 17 %
Monocytes Absolute: 0.7 10*3/uL (ref 0.1–0.9)
Neutrophils Absolute: 2.1 10*3/uL (ref 1.4–7.0)
Neutrophils: 51 %
PLATELETS: 74 10*3/uL — AB (ref 150–379)
RBC: 4.74 x10E6/uL (ref 4.14–5.80)
RDW: 14.1 % (ref 12.3–15.4)
WBC: 4.1 10*3/uL (ref 3.4–10.8)

## 2017-07-06 LAB — LIPID PANEL
CHOL/HDL RATIO: 3 ratio (ref 0.0–5.0)
CHOLESTEROL TOTAL: 108 mg/dL (ref 100–199)
HDL: 36 mg/dL — AB (ref >39)
LDL CALC: 55 mg/dL (ref 0–99)
TRIGLYCERIDES: 85 mg/dL (ref 0–149)
VLDL Cholesterol Cal: 17 mg/dL (ref 5–40)

## 2017-07-06 LAB — MICROALBUMIN / CREATININE URINE RATIO
Creatinine, Urine: 389.3 mg/dL
Microalb/Creat Ratio: 10.6 mg/g creat (ref 0.0–30.0)
Microalbumin, Urine: 41.3 ug/mL

## 2017-07-06 LAB — AMMONIA: AMMONIA: 95 ug/dL (ref 27–102)

## 2017-07-08 ENCOUNTER — Encounter: Payer: Self-pay | Admitting: Family Medicine

## 2017-08-15 ENCOUNTER — Telehealth: Payer: Self-pay | Admitting: Family Medicine

## 2017-08-15 NOTE — Telephone Encounter (Signed)
Error

## 2017-09-10 DIAGNOSIS — E119 Type 2 diabetes mellitus without complications: Secondary | ICD-10-CM | POA: Diagnosis not present

## 2017-09-10 DIAGNOSIS — H524 Presbyopia: Secondary | ICD-10-CM | POA: Diagnosis not present

## 2017-09-10 LAB — HM DIABETES EYE EXAM

## 2017-10-03 ENCOUNTER — Other Ambulatory Visit: Payer: Self-pay | Admitting: Family Medicine

## 2017-10-09 ENCOUNTER — Ambulatory Visit (INDEPENDENT_AMBULATORY_CARE_PROVIDER_SITE_OTHER): Payer: Medicare HMO | Admitting: Family Medicine

## 2017-10-09 ENCOUNTER — Encounter: Payer: Self-pay | Admitting: Family Medicine

## 2017-10-09 VITALS — BP 114/66 | HR 73 | Temp 98.9°F | Ht 69.0 in | Wt 187.2 lb

## 2017-10-09 DIAGNOSIS — E782 Mixed hyperlipidemia: Secondary | ICD-10-CM

## 2017-10-09 DIAGNOSIS — R3915 Urgency of urination: Secondary | ICD-10-CM

## 2017-10-09 DIAGNOSIS — E119 Type 2 diabetes mellitus without complications: Secondary | ICD-10-CM

## 2017-10-09 DIAGNOSIS — K7030 Alcoholic cirrhosis of liver without ascites: Secondary | ICD-10-CM

## 2017-10-09 LAB — BAYER DCA HB A1C WAIVED: HB A1C (BAYER DCA - WAIVED): 5.9 % (ref <7.0)

## 2017-10-09 NOTE — Progress Notes (Signed)
Subjective:  Patient ID: Spencer Stanley, male    DOB: 26-Jan-1961  Age: 57 y.o. MRN: [id number]  CC: Medical Management of Chronic Issues (pt here today for routine follow up of his chronic medical conditions and also c/o urinary urgency over the past month or so)   HPI Eulises Kijowski presents for follow-up of diabetes. Patient does  check blood sugar at home.  Patient states fasting is usually around 110.  Sometimes 120-130.  After meals it may go up to 140. Patient denies symptoms such as polyuria, polydipsia, excessive hunger, nausea No significant hypoglycemic spells noted. Medications as noted below. Taking them regularly without complication/adverse reaction being reported today. Patient in for follow-up of elevated cholesterol. Doing well without complaints on current medication. Denies side effects of statin including myalgia and arthralgia and nausea. Also in today for liver function testing. Currently no chest pain, shortness of breath or other cardiovascular related symptoms noted. Patient also was followed for cirrhosis.  He has one new concern today.  He has been having some urinary urgency.  He describes a couple of occasions where he has wet himself due to inability to get to a facility in time.  He does not routinely have to go within a short time of feeling the urge and he is able to hold his urine adequately.  However if he waits too long he cannot hold it for an indefinite period of time.  He has had no ascites noted although he has just a little bit of weight gain noted.  He has had no edema in the legs or ankles.  He denies shortness of breath.  He has not had any abdominal pain.  He is not having any issues with diarrhea anymore.  Patient tells me that he and his girlfriend are breaking up.  They decided to become best friends.  However he would like to have a relationship that is more affectionate.    Depression screen [facility] 2/9 07/05/2017 04/04/2017 01/02/2017  Decreased Interest '1 1 1    ' Down, Depressed, Hopeless '1 1 1  ' PHQ - 2 Score '2 2 2  ' Altered sleeping '1 2 3  ' Tired, decreased energy - 2 3  Change in appetite '1 1 3  ' Feeling bad or failure about yourself  '1 1 1  ' Trouble concentrating 1 0 1  Moving slowly or fidgety/restless '1 1 2  ' Suicidal thoughts 0 0 0  PHQ-9 Score '7 9 15  ' Difficult doing work/chores - - -  Some recent data might be hidden    History Elester has a past medical history of Anxiety, Cirrhosis ([place]), Esophageal varices ([place]), Lyme disease, and Rocky Mountain spotted fever.   He has a past surgical history that includes Hernia repair and Esophageal varice ligation.   His family history includes Aneurysm in his mother; Cancer in his father and mother; Dementia in his father; Diabetes in his sister; Heart failure in his father.He reports that he has been smoking.  He has a 33.00 pack-year smoking history. He quit smokeless tobacco use about 38 years ago. His smokeless tobacco use included chew. He reports that he does not drink alcohol or use drugs.    ROS Review of Systems  Constitutional: Negative.   HENT: Negative.   Eyes: Negative for visual disturbance.  Respiratory: Negative for cough and shortness of breath.   Cardiovascular: Negative for chest pain and leg swelling.  Gastrointestinal: Negative for abdominal pain, diarrhea, nausea and vomiting.  Genitourinary: Negative for difficulty urinating.  Musculoskeletal: Negative for arthralgias and myalgias.  Skin: Negative for rash.  Neurological: Negative for headaches.  Psychiatric/Behavioral: Negative for sleep disturbance.    Objective:  BP 114/66   Pulse 73   Temp 98.9 F (37.2 C) (Oral)   Ht '5\' 9"'  (1.753 m)   Wt 187 lb 4 oz (84.9 kg)   BMI 27.65 kg/m   BP Readings from Last 3 Encounters:  10/09/17 114/66  07/05/17 (!) 98/57  04/04/17 (!) 83/49    Wt Readings from Last 3 Encounters:  10/09/17 187 lb 4 oz (84.9 kg)  07/05/17 181 lb (82.1 kg)  04/04/17 181 lb (82.1 kg)      Physical Exam  Constitutional: He is oriented to person, place, and time. No distress.  HENT:  Head: Normocephalic and atraumatic.  Right Ear: External ear normal.  Left Ear: External ear normal.  Nose: Nose normal.  Mouth/Throat: Oropharynx is clear and moist.  Eyes: Pupils are equal, round, and reactive to light. Conjunctivae and EOM are normal. No scleral icterus.  Neck: Normal range of motion. Neck supple. No JVD present. No tracheal deviation present. No thyromegaly present.  Cardiovascular: Normal rate, regular rhythm and normal heart sounds. Exam reveals no gallop and no friction rub.  No murmur heard. Pulmonary/Chest: Effort normal and breath sounds normal. No respiratory distress. He has no wheezes. He has no rales. He exhibits no tenderness.  Abdominal: Soft. Bowel sounds are normal. He exhibits no distension and no mass. There is no tenderness. There is no rebound and no guarding.  Musculoskeletal: Normal range of motion. He exhibits no edema or tenderness.  Lymphadenopathy:    He has no cervical adenopathy.  Neurological: He is alert and oriented to person, place, and time. He displays normal reflexes. No cranial nerve deficit. He exhibits normal muscle tone. Coordination normal.  Skin: Skin is warm and dry. No rash noted. He is not diaphoretic. No erythema.  Psychiatric: Judgment normal.  Vitals reviewed.     Assessment & Plan:   Juwan was seen today for medical management of chronic issues.  Diagnoses and all orders for this visit:  Type 2 diabetes mellitus without complication, without long-term current use of insulin (HCC) -     CBC with Differential/Platelet -     CMP14+EGFR -     Bayer DCA Hb A1c Waived  Mixed hyperlipidemia  Alcoholic cirrhosis of liver without ascites (HCC) -     Ammonia  Urinary urgency -     Urine Culture       I have discontinued Shanon Brow Canan's ciclopirox and colchicine. I am also having him maintain his multivitamin,  CALCIUM-MAGNESIUM-ZINC PO, albuterol, rifaximin, tadalafil, accu-chek multiclix, minocycline, Multiple Vitamins-Minerals (ZINC PO), atorvastatin, FLUoxetine, metFORMIN, pantoprazole, and spironolactone.  Allergies as of 10/09/2017      Reactions   Other Other (See Comments)   Other reaction(s): Mental Status Changes (intolerance) After medicated induced coma patient seeing things that weren't there.       Medication List        Accurate as of 10/09/17  3:51 PM. Always use your most recent med list.          accu-chek multiclix lancets Check blood sugar three times a day   albuterol 108 (90 Base) MCG/ACT inhaler Commonly known as:  PROVENTIL HFA;VENTOLIN HFA Inhale 2 puffs into the lungs every 6 (six) hours as needed for wheezing or shortness of breath.   atorvastatin 10 MG tablet Commonly known as:  LIPITOR TAKE 1 TABLET EVERY  DAY FOR CHOLESTEROL   CALCIUM-MAGNESIUM-ZINC PO Take 1 capsule by mouth 2 (two) times daily.   FLUoxetine 20 MG capsule Commonly known as:  PROZAC Take 1 capsule (20 mg total) by mouth daily.   metFORMIN 750 MG 24 hr tablet Commonly known as:  GLUCOPHAGE-XR Take 1 tablet (750 mg total) by mouth daily with breakfast.   minocycline 100 MG capsule Commonly known as:  MINOCIN,DYNACIN Take 1 capsule (100 mg total) by mouth 2 (two) times daily.   multivitamin Tabs tablet Take 1 tablet by mouth daily.   pantoprazole 40 MG tablet Commonly known as:  PROTONIX TAKE 1 TABLET (40 MG TOTAL) BY MOUTH DAILY.   rifaximin 550 MG Tabs tablet Commonly known as:  XIFAXAN Take 1 tablet (550 mg total) by mouth 2 (two) times daily.   spironolactone 25 MG tablet Commonly known as:  ALDACTONE Take 1 tablet (25 mg total) by mouth daily.   tadalafil 20 MG tablet Commonly known as:  ADCIRCA/CIALIS Take 0.5-1 tablets (10-20 mg total) by mouth every other day as needed for erectile dysfunction.   ZINC PO Take by mouth.        Follow-up: Return in about 3  months (around 01/09/2018).  Claretta Fraise, M.D.

## 2017-10-09 NOTE — Patient Instructions (Signed)

## 2017-10-10 ENCOUNTER — Other Ambulatory Visit: Payer: Self-pay | Admitting: Family Medicine

## 2017-10-10 LAB — CMP14+EGFR
ALBUMIN: 3.9 g/dL (ref 3.5–5.5)
ALT: 23 IU/L (ref 0–44)
AST: 41 IU/L — ABNORMAL HIGH (ref 0–40)
Albumin/Globulin Ratio: 1.6 (ref 1.2–2.2)
Alkaline Phosphatase: 127 IU/L — ABNORMAL HIGH (ref 39–117)
BUN / CREAT RATIO: 11 (ref 9–20)
BUN: 9 mg/dL (ref 6–24)
Bilirubin Total: 1 mg/dL (ref 0.0–1.2)
CALCIUM: 9.6 mg/dL (ref 8.7–10.2)
CO2: 21 mmol/L (ref 20–29)
CREATININE: 0.83 mg/dL (ref 0.76–1.27)
Chloride: 110 mmol/L — ABNORMAL HIGH (ref 96–106)
GFR, EST AFRICAN AMERICAN: 113 mL/min/{1.73_m2} (ref >59)
GFR, EST NON AFRICAN AMERICAN: 98 mL/min/{1.73_m2} (ref >59)
GLUCOSE: 113 mg/dL — AB (ref 65–99)
Globulin, Total: 2.5 g/dL (ref 1.5–4.5)
Potassium: 4.2 mmol/L (ref 3.5–5.2)
Sodium: 144 mmol/L (ref 134–144)
TOTAL PROTEIN: 6.4 g/dL (ref 6.0–8.5)

## 2017-10-10 LAB — CBC WITH DIFFERENTIAL/PLATELET
Basophils Absolute: 0 10*3/uL (ref 0.0–0.2)
Basos: 1 %
EOS (ABSOLUTE): 0.3 10*3/uL (ref 0.0–0.4)
EOS: 9 %
HEMOGLOBIN: 13.1 g/dL (ref 13.0–17.7)
Hematocrit: 40.5 % (ref 37.5–51.0)
IMMATURE GRANS (ABS): 0 10*3/uL (ref 0.0–0.1)
IMMATURE GRANULOCYTES: 0 %
LYMPHS ABS: 0.9 10*3/uL (ref 0.7–3.1)
LYMPHS: 23 %
MCH: 29.2 pg (ref 26.6–33.0)
MCHC: 32.3 g/dL (ref 31.5–35.7)
MCV: 90 fL (ref 79–97)
MONOCYTES: 16 %
Monocytes Absolute: 0.6 10*3/uL (ref 0.1–0.9)
Neutrophils Absolute: 1.9 10*3/uL (ref 1.4–7.0)
Neutrophils: 51 %
Platelets: 82 10*3/uL — CL (ref 150–450)
RBC: 4.48 x10E6/uL (ref 4.14–5.80)
RDW: 15.4 % (ref 12.3–15.4)
WBC: 3.7 10*3/uL (ref 3.4–10.8)

## 2017-10-10 LAB — AMMONIA: Ammonia: 236 ug/dL (ref 27–102)

## 2017-10-11 LAB — URINE CULTURE

## 2017-10-22 ENCOUNTER — Telehealth: Payer: Self-pay | Admitting: Family Medicine

## 2017-10-22 DIAGNOSIS — Z87898 Personal history of other specified conditions: Secondary | ICD-10-CM | POA: Diagnosis not present

## 2017-10-22 DIAGNOSIS — R32 Unspecified urinary incontinence: Secondary | ICD-10-CM

## 2017-10-22 DIAGNOSIS — K7290 Hepatic failure, unspecified without coma: Secondary | ICD-10-CM | POA: Diagnosis not present

## 2017-10-22 DIAGNOSIS — K7030 Alcoholic cirrhosis of liver without ascites: Secondary | ICD-10-CM | POA: Diagnosis not present

## 2017-10-22 DIAGNOSIS — Z9889 Other specified postprocedural states: Secondary | ICD-10-CM | POA: Diagnosis not present

## 2017-10-22 DIAGNOSIS — R3915 Urgency of urination: Secondary | ICD-10-CM

## 2017-10-22 NOTE — Telephone Encounter (Signed)
What type of referral do you need? Urology--cannot hold urine in time to go to the restroom  Have you been seen at our office for this problem? Seen [facility] 10/09/17  (If no, schedule them an appointment.  They will need to be seen before a referral can be done.)  Is there a particular doctor or location that you prefer? Local if possible  Patient notified that referrals can take up to a week or longer to process. If they haven't heard anything within a week they should call back and speak with the referral department.

## 2017-10-22 NOTE — Telephone Encounter (Signed)
Please refer as pt requests 

## 2017-10-23 DIAGNOSIS — K7460 Unspecified cirrhosis of liver: Secondary | ICD-10-CM | POA: Diagnosis not present

## 2017-10-31 DIAGNOSIS — R161 Splenomegaly, not elsewhere classified: Secondary | ICD-10-CM | POA: Diagnosis not present

## 2017-10-31 DIAGNOSIS — K7030 Alcoholic cirrhosis of liver without ascites: Secondary | ICD-10-CM | POA: Diagnosis not present

## 2017-11-05 ENCOUNTER — Other Ambulatory Visit: Payer: Self-pay | Admitting: Family Medicine

## 2017-12-25 ENCOUNTER — Ambulatory Visit: Payer: Medicare HMO | Admitting: Urology

## 2017-12-25 DIAGNOSIS — N3281 Overactive bladder: Secondary | ICD-10-CM | POA: Diagnosis not present

## 2018-01-07 ENCOUNTER — Other Ambulatory Visit: Payer: Self-pay | Admitting: Family Medicine

## 2018-01-09 ENCOUNTER — Encounter: Payer: Self-pay | Admitting: Family Medicine

## 2018-01-09 ENCOUNTER — Ambulatory Visit (INDEPENDENT_AMBULATORY_CARE_PROVIDER_SITE_OTHER): Payer: Medicare HMO | Admitting: Family Medicine

## 2018-01-09 VITALS — BP 109/78 | HR 77 | Temp 97.7°F | Ht 69.0 in | Wt 179.5 lb

## 2018-01-09 DIAGNOSIS — E119 Type 2 diabetes mellitus without complications: Secondary | ICD-10-CM | POA: Diagnosis not present

## 2018-01-09 DIAGNOSIS — K7030 Alcoholic cirrhosis of liver without ascites: Secondary | ICD-10-CM

## 2018-01-09 DIAGNOSIS — Z125 Encounter for screening for malignant neoplasm of prostate: Secondary | ICD-10-CM

## 2018-01-09 LAB — BAYER DCA HB A1C WAIVED: HB A1C: 5.9 % (ref <7.0)

## 2018-01-10 LAB — CMP14+EGFR
ALBUMIN: 4 g/dL (ref 3.5–5.5)
ALK PHOS: 138 IU/L — AB (ref 39–117)
ALT: 27 IU/L (ref 0–44)
AST: 35 IU/L (ref 0–40)
Albumin/Globulin Ratio: 1.4 (ref 1.2–2.2)
BILIRUBIN TOTAL: 1.2 mg/dL (ref 0.0–1.2)
BUN / CREAT RATIO: 12 (ref 9–20)
BUN: 11 mg/dL (ref 6–24)
CO2: 21 mmol/L (ref 20–29)
Calcium: 9.2 mg/dL (ref 8.7–10.2)
Chloride: 107 mmol/L — ABNORMAL HIGH (ref 96–106)
Creatinine, Ser: 0.91 mg/dL (ref 0.76–1.27)
GFR calc Af Amer: 107 mL/min/{1.73_m2} (ref >59)
GFR calc non Af Amer: 93 mL/min/{1.73_m2} (ref >59)
Globulin, Total: 2.8 g/dL (ref 1.5–4.5)
Glucose: 104 mg/dL — ABNORMAL HIGH (ref 65–99)
POTASSIUM: 3.8 mmol/L (ref 3.5–5.2)
Sodium: 143 mmol/L (ref 134–144)
Total Protein: 6.8 g/dL (ref 6.0–8.5)

## 2018-01-10 LAB — CBC WITH DIFFERENTIAL/PLATELET
BASOS ABS: 0 10*3/uL (ref 0.0–0.2)
Basos: 1 %
EOS (ABSOLUTE): 0.2 10*3/uL (ref 0.0–0.4)
Eos: 5 %
HEMATOCRIT: 39.7 % (ref 37.5–51.0)
HEMOGLOBIN: 13.9 g/dL (ref 13.0–17.7)
Immature Grans (Abs): 0 10*3/uL (ref 0.0–0.1)
Immature Granulocytes: 0 %
LYMPHS: 20 %
Lymphocytes Absolute: 0.8 10*3/uL (ref 0.7–3.1)
MCH: 30.6 pg (ref 26.6–33.0)
MCHC: 35 g/dL (ref 31.5–35.7)
MCV: 87 fL (ref 79–97)
MONOS ABS: 0.5 10*3/uL (ref 0.1–0.9)
Monocytes: 12 %
NEUTROS PCT: 62 %
Neutrophils Absolute: 2.4 10*3/uL (ref 1.4–7.0)
Platelets: 95 10*3/uL — CL (ref 150–450)
RBC: 4.54 x10E6/uL (ref 4.14–5.80)
RDW: 15.5 % — AB (ref 12.3–15.4)
WBC: 3.8 10*3/uL (ref 3.4–10.8)

## 2018-01-10 LAB — PSA, TOTAL AND FREE
PROSTATE SPECIFIC AG, SERUM: 1 ng/mL (ref 0.0–4.0)
PSA, Free Pct: 44 %
PSA, Free: 0.44 ng/mL

## 2018-01-10 LAB — AMMONIA: Ammonia: 140 ug/dL (ref 27–102)

## 2018-01-14 ENCOUNTER — Encounter: Payer: Self-pay | Admitting: Family Medicine

## 2018-01-14 NOTE — Progress Notes (Signed)
Subjective:  Patient ID: Spencer Stanley, male    DOB: 09-Nov-1960  Age: 57 y.o. MRN: [id number]  CC: Medical Management of Chronic Issues   HPI Cathan Gearin presents for Follow-up of diabetes. Patient checks blood sugar at home rarely.  Patient denies symptoms such as polyuria, polydipsia, excessive hunger, nausea No significant hypoglycemic spells noted. Medications reviewed. Pt reports taking them regularly without complication/adverse reaction being reported today.  Checking feet occasionally. Overdue for eye exam. Needs prostate test as well. Pt. Also treated for cirrhosis. Taking xifaxan. Works for diarrhea and ammonia level.   History Samaj has a past medical history of Anxiety, Cirrhosis ([place]), Esophageal varices ([place]), Lyme disease, and Rocky Mountain spotted fever.   He has a past surgical history that includes Hernia repair and Esophageal varice ligation.   His family history includes Aneurysm in his mother; Cancer in his father and mother; Dementia in his father; Diabetes in his sister; Heart failure in his father.He reports that he has been smoking. He has a 33.00 pack-year smoking history. He quit smokeless tobacco use about 38 years ago.  His smokeless tobacco use included chew. He reports that he does not drink alcohol or use drugs.  Current Outpatient Medications on File Prior to Visit  Medication Sig Dispense Refill  . albuterol (PROVENTIL HFA;VENTOLIN HFA) 108 (90 BASE) MCG/ACT inhaler Inhale 2 puffs into the lungs every 6 (six) hours as needed for wheezing or shortness of breath. 18 g 3  . atorvastatin (LIPITOR) 10 MG tablet TAKE 1 TABLET EVERY DAY FOR CHOLESTEROL 90 tablet 1  . CALCIUM-MAGNESIUM-ZINC PO Take 1 capsule by mouth 2 (two) times daily.    Marland Kitchen FLUoxetine (PROZAC) 20 MG capsule Take 1 capsule (20 mg total) by mouth daily. 90 capsule 1  . glucose blood (ACCU-CHEK AVIVA PLUS) test strip Check blood sugars three times daily 300 each 3  . Lancets (ACCU-CHEK MULTICLIX)  lancets Check blood sugar three times a day 102 each 12  . metFORMIN (GLUCOPHAGE-XR) 750 MG 24 hr tablet TAKE 1 TABLET (750 MG TOTAL) BY MOUTH DAILY WITH BREAKFAST. 90 tablet 0  . minocycline (MINOCIN,DYNACIN) 100 MG capsule Take 1 capsule (100 mg total) by mouth 2 (two) times daily. 180 capsule 1  . Multiple Vitamins-Minerals (ZINC PO) Take by mouth.    . multivitamin (ONE-A-DAY MEN'S) TABS tablet Take 1 tablet by mouth daily.    . pantoprazole (PROTONIX) 40 MG tablet TAKE 1 TABLET (40 MG TOTAL) BY MOUTH DAILY. 90 tablet 1  . rifaximin (XIFAXAN) 550 MG TABS tablet Take 1 tablet (550 mg total) by mouth 2 (two) times daily. 60 tablet 5  . spironolactone (ALDACTONE) 25 MG tablet Take 1 tablet (25 mg total) by mouth daily. 90 tablet 1  . tadalafil (CIALIS) 20 MG tablet Take 0.5-1 tablets (10-20 mg total) by mouth every other day as needed for erectile dysfunction. 15 tablet 4   No current facility-administered medications on file prior to visit.     ROS Review of Systems  Constitutional: Negative.   HENT: Negative.   Eyes: Negative for visual disturbance.  Respiratory: Negative for cough and shortness of breath.   Cardiovascular: Negative for chest pain and leg swelling.  Gastrointestinal: Negative for abdominal pain, diarrhea, nausea and vomiting.  Genitourinary: Negative for difficulty urinating.  Musculoskeletal: Negative for arthralgias and myalgias.  Skin: Negative for rash.  Neurological: Negative for headaches.  Psychiatric/Behavioral: Negative for sleep disturbance.    Objective:  BP 109/78   Pulse 77  Temp 97.7 F (36.5 C) (Oral)   Ht '5\' 9"'  (1.753 m)   Wt 179 lb 8 oz (81.4 kg)   BMI 26.51 kg/m   BP Readings from Last 3 Encounters:  01/09/18 109/78  10/09/17 114/66  07/05/17 (!) 98/57    Wt Readings from Last 3 Encounters:  01/09/18 179 lb 8 oz (81.4 kg)  10/09/17 187 lb 4 oz (84.9 kg)  07/05/17 181 lb (82.1 kg)     Physical Exam  Constitutional: He is  oriented to person, place, and time. He appears well-developed and well-nourished. No distress.  HENT:  Head: Normocephalic and atraumatic.  Right Ear: External ear normal.  Left Ear: External ear normal.  Nose: Nose normal.  Mouth/Throat: Oropharynx is clear and moist.  Eyes: Pupils are equal, round, and reactive to light. Conjunctivae and EOM are normal.  Neck: Normal range of motion. Neck supple.  Cardiovascular: Normal rate, regular rhythm and normal heart sounds.  No murmur heard. Pulmonary/Chest: Effort normal and breath sounds normal. No respiratory distress. He has no wheezes. He has no rales.  Abdominal: Soft. There is no tenderness.  Musculoskeletal: Normal range of motion.  Neurological: He is alert and oriented to person, place, and time. He has normal reflexes.  Skin: Skin is warm and dry.  Psychiatric: He has a normal mood and affect. His behavior is normal. Judgment and thought content normal.      Assessment & Plan:   Jonanthony was seen today for medical management of chronic issues.  Diagnoses and all orders for this visit:  Type 2 diabetes mellitus without complication, without long-term current use of insulin (HCC) -     CBC with Differential/Platelet -     CMP14+EGFR -     Bayer DCA Hb S1X Waived  Alcoholic cirrhosis of liver without ascites (HCC) -     Ammonia  Special screening for malignant neoplasm of prostate -     PSA, total and free      I am having [name] maintain his multivitamin, CALCIUM-MAGNESIUM-ZINC PO, albuterol, rifaximin, tadalafil, accu-chek multiclix, minocycline, Multiple Vitamins-Minerals (ZINC PO), atorvastatin, FLUoxetine, pantoprazole, spironolactone, glucose blood, and metFORMIN.  No orders of the defined types were placed in this encounter.    Follow-up: Return in about 3 months (around 04/11/2018).  Claretta Fraise, M.D.

## 2018-01-22 ENCOUNTER — Other Ambulatory Visit: Payer: Self-pay | Admitting: Family Medicine

## 2018-01-24 ENCOUNTER — Other Ambulatory Visit: Payer: Self-pay | Admitting: Family Medicine

## 2018-03-21 ENCOUNTER — Ambulatory Visit (INDEPENDENT_AMBULATORY_CARE_PROVIDER_SITE_OTHER): Payer: Medicare HMO | Admitting: *Deleted

## 2018-03-21 ENCOUNTER — Encounter: Payer: Self-pay | Admitting: *Deleted

## 2018-03-21 VITALS — BP 85/56 | HR 79 | Ht 69.0 in | Wt 177.0 lb

## 2018-03-21 DIAGNOSIS — Z23 Encounter for immunization: Secondary | ICD-10-CM

## 2018-03-21 DIAGNOSIS — Z0000 Encounter for general adult medical examination without abnormal findings: Secondary | ICD-10-CM | POA: Diagnosis not present

## 2018-03-21 DIAGNOSIS — K7030 Alcoholic cirrhosis of liver without ascites: Secondary | ICD-10-CM | POA: Insufficient documentation

## 2018-03-21 DIAGNOSIS — D649 Anemia, unspecified: Secondary | ICD-10-CM | POA: Insufficient documentation

## 2018-03-21 DIAGNOSIS — I8510 Secondary esophageal varices without bleeding: Secondary | ICD-10-CM | POA: Insufficient documentation

## 2018-03-21 NOTE — Patient Instructions (Signed)
  Mr. Fodor , Thank you for taking time to come for your Medicare Wellness Visit. I appreciate your ongoing commitment to your health goals. Please review the following plan we discussed and let me know if I can assist you in the future.   These are the goals we discussed: Goals    . Exercise 150 minutes per week (moderate activity)       This is a list of the screening recommended for you and due dates:  Health Maintenance  Topic Date Due  .  Hepatitis C: One time screening is recommended by Center for Disease Control  (CDC) for  adults born from [birth year] through 1966.   07-Apr-1961  . HIV Screening  11/08/1974  . Colon Cancer Screening  06/15/2016  . Flu Shot  12/13/2017  . Complete foot exam   07/05/2018  . Urine Protein Check  07/05/2018  . Hemoglobin A1C  07/12/2018  . Eye exam for diabetics  09/11/2018  . Tetanus Vaccine  08/24/2022  . Pneumococcal vaccine  Completed

## 2018-03-21 NOTE — Progress Notes (Signed)
Subjective:   Spencer Stanley is a 57 y.o. male who presents for a Medicare Annual Wellness Visit. Spencer Stanley lives at home with his fiancee and her son. He has family in [place] that he visits once or twice a month. He graduated from [employer] with a  Bachelor's degree and is retired/disabled now due to cirrhosis. He is not very active and enjoys watching television.    Review of Systems    Patient reports that his overall health is unchanged compared to last year.  Cardiac Risk Factors include: advanced age (>49men, >57 women);dyslipidemia;male gender;smoking/ tobacco exposure;sedentary lifestyle;diabetes mellitus;hypertension;family history of premature cardiovascular disease  GI: cirrhosis. Followed at [facility].   All other systems negative       Current Medications (verified) Outpatient Encounter Medications as of 03/21/2018  Medication Sig  . albuterol (PROVENTIL HFA;VENTOLIN HFA) 108 (90 BASE) MCG/ACT inhaler Inhale 2 puffs into the lungs every 6 (six) hours as needed for wheezing or shortness of breath.  Marland Kitchen atorvastatin (LIPITOR) 10 MG tablet TAKE 1 TABLET EVERY DAY FOR CHOLESTEROL  . CALCIUM-MAGNESIUM-ZINC PO Take 1 capsule by mouth 2 (two) times daily.  Marland Kitchen FLUoxetine (PROZAC) 20 MG capsule TAKE 1 CAPSULE (20 MG TOTAL) BY MOUTH DAILY.  Marland Kitchen glucose blood (ACCU-CHEK AVIVA PLUS) test strip Check blood sugars three times daily  . lactulose (CHRONULAC) 10 GM/15ML solution Take 45 g by mouth 3 (three) times daily.  . Lancets (ACCU-CHEK MULTICLIX) lancets Check blood sugar three times a day  . metFORMIN (GLUCOPHAGE-XR) 750 MG 24 hr tablet TAKE 1 TABLET (750 MG TOTAL) BY MOUTH DAILY WITH BREAKFAST.  . minocycline (MINOCIN,DYNACIN) 100 MG capsule TAKE 1 CAPSULE TWICE DAILY  . Multiple Vitamins-Minerals (ZINC PO) Take by mouth.  . multivitamin (ONE-A-DAY MEN'S) TABS tablet Take 1 tablet by mouth daily.  . pantoprazole (PROTONIX) 40 MG tablet TAKE  1 TABLET (40 MG TOTAL) BY MOUTH DAILY.  . rifaximin (XIFAXAN) 550 MG TABS tablet Take 1 tablet (550 mg total) by mouth 2 (two) times daily.  Marland Kitchen spironolactone (ALDACTONE) 25 MG tablet TAKE 1 TABLET EVERY DAY  . tadalafil (CIALIS) 20 MG tablet Take 0.5-1 tablets (10-20 mg total) by mouth every other day as needed for erectile dysfunction.   No facility-administered encounter medications on file as of 03/21/2018.     Allergies (verified) Other   History: Past Medical History:  Diagnosis Date  . Anxiety   . Cirrhosis ([place])   . Esophageal varices ([place])   . Lyme disease   . Rocky Mountain spotted fever    Past Surgical History:  Procedure Laterality Date  . ESOPHAGEAL VARICE LIGATION     TIPS  . HERNIA REPAIR     Family History  Problem Relation Age of Onset  . Aneurysm Mother   . Cancer Mother        breast  . Dementia Father   . Cancer Father        brain tumor  . Heart failure Father   . Diabetes Sister    Social History   Socioeconomic History  . Marital status: Soil scientist    Spouse name: Not on file  . Number of children: 0  . Years of education: 16  . Highest education level: Bachelor's degree (e.g., BA, AB, BS)  Occupational History  . Not on file  Social Needs  . Financial resource strain: Not hard at all  . Food insecurity:    Worry: Never true  Inability: Never true  . Transportation needs:    Medical: No    Non-medical: No  Tobacco Use  . Smoking status: Current Every Day Smoker    Packs/day: 0.50    Years: 33.00    Pack years: 16.50  . Smokeless tobacco: Former Systems developer    Types: Chew    Quit date: 12  . Tobacco comment: 03/21/2018 cut back to 1/2 ppd  Substance and Sexual Activity  . Alcohol use: No    Comment: no alcohol since 2013  . Drug use: No  . Sexual activity: Yes  Lifestyle  . Physical activity:    Days per week: 0 days    Minutes per session: 0 min  . Stress: Only a little  Relationships  . Social connections:    Talks  on phone: More than three times a week    Gets together: More than three times a week    Attends religious service: Never    Active member of club or organization: No    Attends meetings of clubs or organizations: Never    Relationship status: Living with partner  Other Topics Concern  . Not on file  Social History Narrative  . Not on file    Tobacco Use Yes.  Smoking cessation offered? Yes. Patient has been decreasing and is down from 1 ppd to 1/2 ppd.  Clinical Intake:    Pain : No/denies pain     Nutritional Status: BMI 25 -29 Overweight Diabetes: No  How often do you need to have someone help you when you read instructions, pamphlets, or other written materials from your doctor or pharmacy?: 1 - Never What is the last grade level you completed in school?: Bachelor's Degree  Interpreter Needed?: No  Information entered by :: Chong Sicilian, RN  Activities of Daily Living In your present state of health, do you have any difficulty performing the following activities: 03/21/2018  Hearing? N  Vision? N  Comment wears glasses and has yearly eye exams  Difficulty concentrating or making decisions? N  Walking or climbing stairs? N  Dressing or bathing? N  Doing errands, shopping? N  Preparing Food and eating ? N  Using the Toilet? N  In the past six months, have you accidently leaked urine? Y  Comment has seen urology for urgency and incontinence  Do you have problems with loss of bowel control? N  Managing your Medications? N  Managing your Finances? N  Housekeeping or managing your Housekeeping? N  Some recent data might be hidden   Exercise Current Exercise Habits: The patient does not participate in regular exercise at present, Exercise limited by: None identified   Depression Screen PHQ 2/9 Scores 03/21/2018 01/09/2018 07/05/2017 04/04/2017  PHQ - 2 Score 1 0 2 2  PHQ- 9 Score - - 7 9     Fall Risk Fall Risk  03/21/2018 01/09/2018 07/05/2017 10/02/2016 02/25/2016   Falls in the past year? 1 No No No Yes  Comment slipped in the mud outside at the trashcan - - - -  Number falls in past yr: 0 - - - 1  Injury with Fall? 0 - - - No  Risk for fall due to : History of fall(s) - - - -  Follow up Falls prevention discussed - - - -    Patient Care Team: Claretta Fraise, MD as PCP - General (Family Medicine) Lenna Sciara, MD as Referring Physician (Gastroenterology) Izora Gala, MD as Consulting Physician (Otolaryngology)  Hospitalizations,  surgeries, and ER visits in previous 12 months No hospitalizations, ER visits, or surgeries this past year.  Objective:    Today's Vitals   03/21/18 1420  BP: (!) 85/56  Pulse: 79  Weight: 177 lb (80.3 kg)  Height: 5\' 9"  (1.753 m)   Body mass index is 26.14 kg/m.  Advanced Directives 03/21/2018 03/20/2017 12/08/2015  Does Patient Have a Medical Advance Directive? No Yes No  Type of Advance Directive - Living will;Healthcare Power of Attorney -  Does patient want to make changes to medical advance directive? - No - Patient declined -  Copy of [place] in Chart? - No - copy requested -  Would patient like information on creating a medical advance directive? Yes (MAU/Ambulatory/Procedural Areas - Information given) - -    Hearing/Vision  No hearing or vision deficits noted during visit.   Cognitive Function: MMSE - Mini Mental State Exam 03/20/2017  Orientation to time 5  Orientation to Place 5  Registration 3  Attention/ Calculation 5  Recall 3  Language- name 2 objects 2  Language- repeat 1  Language- follow 3 step command 3  Language- read & follow direction 1  Write a sentence 1  Copy design 1  Total score 30     6CIT Screen 03/21/2018  What Year? 0 points  What month? 0 points  What time? 0 points  Count back from 20 0 points  Months in reverse 0 points  Repeat phrase 0 points  Total Score 0   Normal Cognitive Function Screening: Yes    Immunizations and Health  Maintenance Immunization History  Administered Date(s) Administered  . Influenza,inj,Quad PF,6+ Mos 06/19/2014, 03/26/2015, 02/25/2016, 03/20/2017, 03/21/2018  . Pneumococcal Polysaccharide-23 03/20/2017   Health Maintenance Due  Topic Date Due  . Hepatitis C Screening  21-Dec-1960  . HIV Screening  11/08/1974  . COLONOSCOPY  06/15/2016  . INFLUENZA VACCINE  12/13/2017   Health Maintenance  Topic Date Due  . Hepatitis C Screening  November 30, 1960  . HIV Screening  11/08/1974  . COLONOSCOPY  06/15/2016  . INFLUENZA VACCINE  12/13/2017  . FOOT EXAM  07/05/2018  . URINE MICROALBUMIN  07/05/2018  . HEMOGLOBIN A1C  07/12/2018  . OPHTHALMOLOGY EXAM  09/11/2018  . TETANUS/TDAP  08/24/2022  . PNEUMOCOCCAL POLYSACCHARIDE VACCINE AGE 69-64 HIGH RISK  Completed        Assessment:   This is a routine wellness examination for Spencer Stanley.      Plan:    Goals    . Exercise 150 minutes per week (moderate activity)        Health Maintenance Recommendations: Influenza vaccine-given today Colonoscopy-likely done at [facility] but patient is not sure HIV screening and Hep C screening has likely been done with cirrhosis workup as well. No results in care everywhere  Additional Screening Recommendations: Lung: Low Dose CT Chest recommended if Age 36-80 years, 30 pack-year currently smoking OR have quit w/in 15years. Patient does qualify. Hepatitis C Screening recommended: yes  Today's Orders Orders Placed This Encounter  Procedures  . Flu Vaccine QUAD 36+ mos IM    Keep f/u with Claretta Fraise, MD and any other specialty appointments you may have Continue current medications Move carefully to avoid falls. Use assistive devices like a cane or walker if needed. Aim for at least 150 minutes of moderate activity a week. This can be chair exercises if necessary. Reading or puzzles are a good way to exercise your brain Stay connected with friends and family.  Social connections are beneficial to  your emotional and mental health.   I have personally reviewed and noted the following in the patient's chart:   . Medical and social history . Use of alcohol, tobacco or illicit drugs  . Current medications and supplements . Functional ability and status . Nutritional status . Physical activity . Advanced directives . List of other physicians . Hospitalizations, surgeries, and ER visits in previous 12 months . Vitals . Screenings to include cognitive, depression, and falls . Referrals and appointments  In addition, I have reviewed and discussed with patient certain preventive protocols, quality metrics, and best practice recommendations. A written personalized care plan for preventive services as well as general preventive health recommendations were provided to patient.     Chong Sicilian, RN   03/21/2018

## 2018-03-24 ENCOUNTER — Other Ambulatory Visit: Payer: Self-pay | Admitting: Family Medicine

## 2018-03-27 ENCOUNTER — Other Ambulatory Visit: Payer: Self-pay | Admitting: Family Medicine

## 2018-04-09 ENCOUNTER — Ambulatory Visit: Payer: Medicare HMO | Admitting: Urology

## 2018-04-15 ENCOUNTER — Ambulatory Visit: Payer: Medicare HMO | Admitting: Family Medicine

## 2018-04-15 ENCOUNTER — Ambulatory Visit (INDEPENDENT_AMBULATORY_CARE_PROVIDER_SITE_OTHER): Payer: Medicare HMO | Admitting: Family Medicine

## 2018-04-15 ENCOUNTER — Encounter: Payer: Self-pay | Admitting: Family Medicine

## 2018-04-15 VITALS — BP 96/54 | HR 73 | Temp 97.6°F | Ht 69.0 in | Wt 179.0 lb

## 2018-04-15 DIAGNOSIS — E782 Mixed hyperlipidemia: Secondary | ICD-10-CM

## 2018-04-15 DIAGNOSIS — K7031 Alcoholic cirrhosis of liver with ascites: Secondary | ICD-10-CM

## 2018-04-15 DIAGNOSIS — E119 Type 2 diabetes mellitus without complications: Secondary | ICD-10-CM | POA: Diagnosis not present

## 2018-04-15 DIAGNOSIS — K7030 Alcoholic cirrhosis of liver without ascites: Secondary | ICD-10-CM | POA: Diagnosis not present

## 2018-04-15 LAB — BAYER DCA HB A1C WAIVED: HB A1C (BAYER DCA - WAIVED): 5.7 % (ref <7.0)

## 2018-04-15 MED ORDER — TADALAFIL 20 MG PO TABS: 10.0000 mg | ORAL_TABLET | ORAL | 4 refills | Status: DC | PRN

## 2018-04-15 MED ORDER — ALBUTEROL SULFATE HFA 108 (90 BASE) MCG/ACT IN AERS: 2.0000 | INHALATION_SPRAY | Freq: Four times a day (QID) | RESPIRATORY_TRACT | 3 refills | Status: DC | PRN

## 2018-04-15 MED ORDER — ONETOUCH ULTRASOFT LANCETS MISC: 11 refills | Status: DC

## 2018-04-15 NOTE — Patient Instructions (Signed)
Continue current medications. Continue good therapeutic lifestyle changes which include good diet and exercise. Fall precautions discussed with patient. If an FOBT was given today- please return it to our front desk. If you are over 58 years old - you may need Prevnar 13 or the adult Pneumonia vaccine.  **Flu shots are available--- please call and schedule a FLU-CLINIC appointment**  After your visit with us today you will receive a survey in the mail or online from [employer] regarding your care with us. Please take a moment to fill this out. Your feedback is very important to us as you can help us better understand your patient needs as well as improve your experience and satisfaction. WE CARE ABOUT YOU!!!    

## 2018-04-15 NOTE — Progress Notes (Signed)
Subjective:  Patient ID: Spencer Stanley, male    DOB: 1960-09-14  Age: 57 y.o. MRN: [id number]  CC: Diabetes (6 mo) and Hyperlipidemia   HPI Oumar Marcott presents for Follow-up of diabetes. Patient states blood sugar readings at home have been between 80-1 10 fasting and 1 60-1 80 postprandial.  Patient denies symptoms such as polyuria, polydipsia, excessive hunger, nausea No significant hypoglycemic spells noted. Medications as noted below. Taking them regularly without complication/adverse reaction being reported today.  Patient in for follow-up of elevated cholesterol. Doing well without complaints on current medication. Denies side effects of statin including myalgia and arthralgia and nausea. Also in today for liver function testing. Currently no chest pain, shortness of breath or other cardiovascular related symptoms noted.  Although he has some protuberance of his belly he denies any recent edema.  He is followed by GI at [facility] as well for his cirrhosis.  Patient in for follow-up of GERD. Currently asymptomatic taking  PPI daily. There is no chest pain or heartburn. No hematemesis and no melena. No dysphagia or choking. Onset is remote. Progression is stable. Complicating factors, none.   Depression screen [facility] 2/9 04/15/2018 03/21/2018 01/09/2018  Decreased Interest 1 1 0  Down, Depressed, Hopeless 0 0 0  PHQ - 2 Score 1 1 0  Altered sleeping - - -  Tired, decreased energy - - -  Change in appetite - - -  Feeling bad or failure about yourself  - - -  Trouble concentrating - - -  Moving slowly or fidgety/restless - - -  Suicidal thoughts - - -  PHQ-9 Score - - -  Difficult doing work/chores - - -  Some recent data might be hidden    History Hanish has a past medical history of Anxiety, Cirrhosis ([place]), Esophageal varices ([place]), Lyme disease, and Rocky Mountain spotted fever.   He has a past surgical history that includes Hernia repair and Esophageal varice ligation.   His  family history includes Aneurysm in his mother; Cancer in his father and mother; Dementia in his father; Diabetes in his sister; Heart failure in his father.He reports that he has been smoking. He has a 16.50 pack-year smoking history. He quit smokeless tobacco use about 38 years ago.  His smokeless tobacco use included chew. He reports that he does not drink alcohol or use drugs.    ROS Review of Systems  Constitutional: Negative.   HENT: Negative.   Eyes: Negative for visual disturbance.  Respiratory: Negative for cough and shortness of breath.   Cardiovascular: Negative for chest pain and leg swelling.  Gastrointestinal: Negative for abdominal pain, diarrhea, nausea and vomiting.  Genitourinary: Negative for difficulty urinating.  Musculoskeletal: Negative for arthralgias and myalgias.  Skin: Negative for rash.  Neurological: Negative for headaches.  Psychiatric/Behavioral: Negative for sleep disturbance.    Objective:  BP (!) 96/54 (BP Location: Right Arm)   Pulse 73   Temp 97.6 F (36.4 C) (Oral)   Ht '5\' 9"'  (1.753 m)   Wt 179 lb (81.2 kg)   BMI 26.43 kg/m    BP Readings from Last 3 Encounters:  04/15/18 (!) 96/54  03/21/18 (!) 85/56  01/09/18 109/78    Wt Readings from Last 3 Encounters:  04/15/18 179 lb (81.2 kg)  03/21/18 177 lb (80.3 kg)  01/09/18 179 lb 8 oz (81.4 kg)     Physical Exam  Constitutional: He is oriented to person, place, and time. He appears well-developed and well-nourished. No distress.  HENT:  Head: Normocephalic and atraumatic.  Right Ear: External ear normal.  Left Ear: External ear normal.  Nose: Nose normal.  Mouth/Throat: Oropharynx is clear and moist.  Eyes: Pupils are equal, round, and reactive to light. Conjunctivae and EOM are normal.  Neck: Normal range of motion. Neck supple.  Cardiovascular: Normal rate, regular rhythm and normal heart sounds.  No murmur heard. Pulmonary/Chest: Effort normal and breath sounds normal. No  respiratory distress. He has no wheezes. He has no rales.  Abdominal: Soft. He exhibits distension (Small fluid wave noted consistent with ascites.). There is no tenderness.  Musculoskeletal: Normal range of motion.  Neurological: He is alert and oriented to person, place, and time. He has normal reflexes.  Skin: Skin is warm and dry.  Psychiatric: He has a normal mood and affect. His behavior is normal. Judgment and thought content normal.      Assessment & Plan:   [place]lin was seen today for diabetes and hyperlipidemia.  Diagnoses and all orders for this visit:  Type 2 diabetes mellitus without complication, without long-term current use of insulin (HCC) -     CMP14+EGFR -     Bayer DCA Hb A1c Waived  Mixed hyperlipidemia -     Lipid panel  Alcoholic cirrhosis of liver with ascites (HCC) -     Ammonia  Other orders -     albuterol (PROVENTIL HFA;VENTOLIN HFA) 108 (90 Base) MCG/ACT inhaler; Inhale 2 puffs into the lungs every 6 (six) hours as needed for wheezing or shortness of breath. -     tadalafil (ADCIRCA/CIALIS) 20 MG tablet; Take 0.5-1 tablets (10-20 mg total) by mouth every other day as needed for erectile dysfunction. -     Lancets (ONETOUCH ULTRASOFT) lancets; Check BS TID and PRN dx.E11.9       I have discontinued Shanon Brow Cassity's accu-chek multiclix. I have also changed his tadalafil. Additionally, I am having him start on onetouch ultrasoft. Lastly, I am having him maintain his multivitamin, CALCIUM-MAGNESIUM-ZINC PO, rifaximin, Multiple Vitamins-Minerals (ZINC PO), glucose blood, spironolactone, FLUoxetine, lactulose, pantoprazole, metFORMIN, atorvastatin, minocycline, and albuterol.  Allergies as of 04/15/2018      Reactions   Other Other (See Comments)   Other reaction(s): Mental Status Changes (intolerance) After medicated induced coma patient seeing things that weren't there.       Medication List        Accurate as of 04/15/18  2:03 PM. Always use your most  recent med list.          albuterol 108 (90 Base) MCG/ACT inhaler Commonly known as:  PROVENTIL HFA;VENTOLIN HFA Inhale 2 puffs into the lungs every 6 (six) hours as needed for wheezing or shortness of breath.   atorvastatin 10 MG tablet Commonly known as:  LIPITOR TAKE 1 TABLET EVERY DAY FOR CHOLESTEROL   CALCIUM-MAGNESIUM-ZINC PO Take 1 capsule by mouth 2 (two) times daily.   FLUoxetine 20 MG capsule Commonly known as:  PROZAC TAKE 1 CAPSULE (20 MG TOTAL) BY MOUTH DAILY.   glucose blood test strip Check blood sugars three times daily   lactulose 10 GM/15ML solution Commonly known as:  CHRONULAC Take 45 g by mouth 3 (three) times daily.   metFORMIN 750 MG 24 hr tablet Commonly known as:  GLUCOPHAGE-XR TAKE 1 TABLET (750 MG TOTAL) BY MOUTH DAILY WITH BREAKFAST.   minocycline 100 MG capsule Commonly known as:  MINOCIN,DYNACIN TAKE 1 CAPSULE TWICE DAILY   multivitamin Tabs tablet Take 1 tablet by mouth daily.  onetouch ultrasoft lancets Check BS TID and PRN dx.E11.9   pantoprazole 40 MG tablet Commonly known as:  PROTONIX TAKE 1 TABLET EVERY DAY   rifaximin 550 MG Tabs tablet Commonly known as:  XIFAXAN Take 1 tablet (550 mg total) by mouth 2 (two) times daily.   spironolactone 25 MG tablet Commonly known as:  ALDACTONE TAKE 1 TABLET EVERY DAY   tadalafil 20 MG tablet Commonly known as:  ADCIRCA/CIALIS Take 0.5-1 tablets (10-20 mg total) by mouth every other day as needed for erectile dysfunction.   ZINC PO Take by mouth.      His ascites seems to be mild and he is asymptomatic on significant doses of Xifaxan and lactulose will monitor the ammonia level.  Follow-up: Return in about 3 months (around 07/15/2018).  Claretta Fraise, M.D.

## 2018-04-18 LAB — CMP14+EGFR
A/G RATIO: 1.5 (ref 1.2–2.2)
ALK PHOS: 125 IU/L — AB (ref 39–117)
ALT: 17 IU/L (ref 0–44)
AST: 29 IU/L (ref 0–40)
Albumin: 3.8 g/dL (ref 3.5–5.5)
BUN/Creatinine Ratio: 8 — ABNORMAL LOW (ref 9–20)
BUN: 7 mg/dL (ref 6–24)
Bilirubin Total: 0.8 mg/dL (ref 0.0–1.2)
CO2: 21 mmol/L (ref 20–29)
Calcium: 9 mg/dL (ref 8.7–10.2)
Chloride: 107 mmol/L — ABNORMAL HIGH (ref 96–106)
Creatinine, Ser: 0.92 mg/dL (ref 0.76–1.27)
GFR calc Af Amer: 106 mL/min/{1.73_m2} (ref >59)
GFR, EST NON AFRICAN AMERICAN: 91 mL/min/{1.73_m2} (ref >59)
GLOBULIN, TOTAL: 2.5 g/dL (ref 1.5–4.5)
Glucose: 96 mg/dL (ref 65–99)
POTASSIUM: 3.9 mmol/L (ref 3.5–5.2)
SODIUM: 143 mmol/L (ref 134–144)
Total Protein: 6.3 g/dL (ref 6.0–8.5)

## 2018-04-18 LAB — AMMONIA

## 2018-04-18 LAB — LIPID PANEL
CHOL/HDL RATIO: 2.9 ratio (ref 0.0–5.0)
Cholesterol, Total: 129 mg/dL (ref 100–199)
HDL: 44 mg/dL (ref >39)
LDL Calculated: 67 mg/dL (ref 0–99)
TRIGLYCERIDES: 88 mg/dL (ref 0–149)
VLDL Cholesterol Cal: 18 mg/dL (ref 5–40)

## 2018-04-19 ENCOUNTER — Other Ambulatory Visit: Payer: Medicare HMO

## 2018-04-19 ENCOUNTER — Other Ambulatory Visit: Payer: Self-pay | Admitting: *Deleted

## 2018-04-19 DIAGNOSIS — K7030 Alcoholic cirrhosis of liver without ascites: Secondary | ICD-10-CM | POA: Diagnosis not present

## 2018-04-20 LAB — AMMONIA: Ammonia: 182 ug/dL (ref 27–102)

## 2018-04-24 ENCOUNTER — Other Ambulatory Visit: Payer: Self-pay | Admitting: *Deleted

## 2018-04-24 MED ORDER — ACCU-CHEK AVIVA VI SOLN: 0 refills | Status: DC

## 2018-04-24 MED ORDER — ACCU-CHEK SOFTCLIX LANCETS MISC: 3 refills | Status: DC

## 2018-04-24 MED ORDER — BD SWAB SINGLE USE REGULAR PADS: MEDICATED_PAD | 3 refills | Status: DC

## 2018-04-26 DIAGNOSIS — K7030 Alcoholic cirrhosis of liver without ascites: Secondary | ICD-10-CM | POA: Diagnosis not present

## 2018-04-26 DIAGNOSIS — R197 Diarrhea, unspecified: Secondary | ICD-10-CM | POA: Diagnosis not present

## 2018-04-26 DIAGNOSIS — I8511 Secondary esophageal varices with bleeding: Secondary | ICD-10-CM | POA: Diagnosis not present

## 2018-04-26 DIAGNOSIS — K7460 Unspecified cirrhosis of liver: Secondary | ICD-10-CM | POA: Diagnosis not present

## 2018-04-26 DIAGNOSIS — K7290 Hepatic failure, unspecified without coma: Secondary | ICD-10-CM | POA: Diagnosis not present

## 2018-04-26 DIAGNOSIS — K7581 Nonalcoholic steatohepatitis (NASH): Secondary | ICD-10-CM | POA: Diagnosis not present

## 2018-04-26 DIAGNOSIS — F17210 Nicotine dependence, cigarettes, uncomplicated: Secondary | ICD-10-CM | POA: Diagnosis not present

## 2018-04-26 DIAGNOSIS — Z23 Encounter for immunization: Secondary | ICD-10-CM | POA: Diagnosis not present

## 2018-05-01 ENCOUNTER — Other Ambulatory Visit: Payer: Self-pay | Admitting: Family Medicine

## 2018-05-01 NOTE — Telephone Encounter (Signed)
Next OV 07/23/18

## 2018-05-17 DIAGNOSIS — R161 Splenomegaly, not elsewhere classified: Secondary | ICD-10-CM | POA: Diagnosis not present

## 2018-05-17 DIAGNOSIS — K7460 Unspecified cirrhosis of liver: Secondary | ICD-10-CM | POA: Diagnosis not present

## 2018-05-17 DIAGNOSIS — K7469 Other cirrhosis of liver: Secondary | ICD-10-CM | POA: Diagnosis not present

## 2018-05-17 DIAGNOSIS — K7581 Nonalcoholic steatohepatitis (NASH): Secondary | ICD-10-CM | POA: Diagnosis not present

## 2018-06-04 ENCOUNTER — Other Ambulatory Visit: Payer: Self-pay | Admitting: Family Medicine

## 2018-06-05 NOTE — Telephone Encounter (Signed)
Last seen 04/23/18

## 2018-07-23 ENCOUNTER — Ambulatory Visit: Payer: Medicare HMO | Admitting: Family Medicine

## 2018-07-24 ENCOUNTER — Other Ambulatory Visit: Payer: Self-pay | Admitting: Family Medicine

## 2018-07-25 ENCOUNTER — Other Ambulatory Visit: Payer: Self-pay | Admitting: Family Medicine

## 2018-08-05 ENCOUNTER — Ambulatory Visit: Payer: Medicare HMO | Admitting: Family Medicine

## 2018-08-10 ENCOUNTER — Other Ambulatory Visit: Payer: Self-pay | Admitting: Family Medicine

## 2018-08-12 NOTE — Telephone Encounter (Signed)
Last seen 04/15/18 

## 2018-11-04 ENCOUNTER — Other Ambulatory Visit: Payer: Self-pay | Admitting: Family Medicine

## 2018-11-05 ENCOUNTER — Ambulatory Visit (INDEPENDENT_AMBULATORY_CARE_PROVIDER_SITE_OTHER): Payer: Medicare HMO | Admitting: Family Medicine

## 2018-11-05 ENCOUNTER — Encounter: Payer: Self-pay | Admitting: Family Medicine

## 2018-11-05 ENCOUNTER — Other Ambulatory Visit: Payer: Self-pay

## 2018-11-05 DIAGNOSIS — E782 Mixed hyperlipidemia: Secondary | ICD-10-CM | POA: Diagnosis not present

## 2018-11-05 DIAGNOSIS — K7030 Alcoholic cirrhosis of liver without ascites: Secondary | ICD-10-CM | POA: Diagnosis not present

## 2018-11-05 DIAGNOSIS — E119 Type 2 diabetes mellitus without complications: Secondary | ICD-10-CM

## 2018-11-05 DIAGNOSIS — K219 Gastro-esophageal reflux disease without esophagitis: Secondary | ICD-10-CM

## 2018-11-05 NOTE — Progress Notes (Signed)
Subjective:    Patient ID: Spencer Stanley, male    DOB: [birth year], 58 y.o.   MRN: [id number]   HPI: Spencer Stanley is a 58 y.o. male presenting for diabetes. Checking glucose BID. "In range." About the same now as they were before last A1cPatient denies symptoms such as polyuria, polydipsia, excessive hunger, nausea No significant hypoglycemic spells noted. Medications as noted below. Taking them regularly without complication/adverse reaction being reported today.   Patient in for follow-up of GERD. Currently asymptomatic taking  PPI daily.  He says he highly recommends Pantoprazole highly. it is working quite well.  there is no chest pain or heartburn. No hematemesis and no melena. No dysphagia or choking. Onset is remote. Progression is stable. Complicating factors, none.  Patient says he is having some loose bowels.  He is using the Xifaxan irregularly.  He says he has to go to the bathroom for bowel movement within an hour after each dose and it will be pasty.  Needs AFP drawn with blood work for cirrhosis.   Patient in for follow-up of elevated cholesterol. Doing well without complaints on current medication. Denies side effects of statin including myalgia and arthralgia and nausea. Also in today for liver function testing. Currently no chest pain, shortness of breath or other cardiovascular related symptoms noted.   Depression screen [facility] 2/9 04/15/2018 03/21/2018 01/09/2018 07/05/2017 04/04/2017  Decreased Interest 1 1 0 1 1  Down, Depressed, Hopeless 0 0 0 1 1  PHQ - 2 Score 1 1 0 2 2  Altered sleeping - - - 1 2  Tired, decreased energy - - - - 2  Change in appetite - - - 1 1  Feeling bad or failure about yourself  - - - 1 1  Trouble concentrating - - - 1 0  Moving slowly or fidgety/restless - - - 1 1  Suicidal thoughts - - - 0 0  PHQ-9 Score - - - 7 9  Difficult doing work/chores - - - - -  Some recent data might be hidden     Relevant past medical, surgical, family and social  history reviewed and updated as indicated.  Interim medical history since our last visit reviewed. Allergies and medications reviewed and updated.  ROS:  Review of Systems  Constitutional: Negative.   HENT: Negative.   Eyes: Negative for visual disturbance.  Respiratory: Negative for cough and shortness of breath.   Cardiovascular: Negative for chest pain and leg swelling.  Gastrointestinal: Positive for diarrhea. Negative for abdominal pain, nausea and vomiting.  Genitourinary: Negative for difficulty urinating.  Musculoskeletal: Negative for arthralgias and myalgias.  Skin: Negative for rash.  Neurological: Negative for headaches.  Psychiatric/Behavioral: Negative for sleep disturbance.     Social History   Tobacco Use  Smoking Status Current Every Day Smoker  . Packs/day: 0.50  . Years: 33.00  . Pack years: 16.50  Smokeless Tobacco Former Systems developer  . Types: Chew  . Quit date: 73  Tobacco Comment   03/21/2018 cut back to 1/2 ppd       Objective:     Wt Readings from Last 3 Encounters:  04/15/18 179 lb (81.2 kg)  03/21/18 177 lb (80.3 kg)  01/09/18 179 lb 8 oz (81.4 kg)     Exam deferred. Pt. Harboring due to COVID 19. Phone visit performed.   Assessment & Plan:   1. Type 2 diabetes mellitus without complication, without long-term current use of insulin ([place])   2. Alcoholic cirrhosis of  liver without ascites ([place])   3. Mixed hyperlipidemia   4. Gastroesophageal reflux disease, esophagitis presence not specified     No orders of the defined types were placed in this encounter.   Orders Placed This Encounter  Procedures  . Bayer DCA Hb A1c Waived  . CBC with Differential/Platelet  . CMP14+EGFR    Order Specific Question:   Has the patient fasted?    Answer:   Yes  . Lipid panel    Order Specific Question:   Has the patient fasted?    Answer:   Yes  . TSH  . Ammonia  . AFP tumor marker      Diagnoses and all orders for this visit:  Type 2 diabetes  mellitus without complication, without long-term current use of insulin ([facility]) -     Bayer DCA Hb A1c Waived -     CBC with Differential/Platelet -     CMP14+EGFR -     Lipid panel -     TSH  Alcoholic cirrhosis of liver without ascites ([facility]) -     CBC with Differential/Platelet -     CMP14+EGFR -     Ammonia -     AFP tumor marker  Mixed hyperlipidemia -     CBC with Differential/Platelet -     CMP14+EGFR -     Lipid panel  Gastroesophageal reflux disease, esophagitis presence not specified -     CBC with Differential/Platelet -     CMP14+EGFR  Resume xifaxan BID. Let me know if bowels continue to move just after each dose.  Virtual Visit via telephone Note  I discussed the limitations, risks, security and privacy concerns of performing an evaluation and management service by telephone and the availability of in person appointments. The patient was identified with two identifiers. Pt.expressed understanding and agreed to proceed. Pt. Is at home. Dr. Livia Snellen is in his office.  Follow Up Instructions:   I discussed the assessment and treatment plan with the patient. The patient was provided an opportunity to ask questions and all were answered. The patient agreed with the plan and demonstrated an understanding of the instructions.   The patient was advised to call back or seek an in-person evaluation if the symptoms worsen or if the condition fails to improve as anticipated.   Total minutes including chart review and phone contact time: 30   Follow up plan: Return in about 6 months (around 05/07/2019) for Wellness, diabetes.  Claretta Fraise, MD [place]

## 2018-11-06 ENCOUNTER — Other Ambulatory Visit: Payer: Self-pay

## 2018-11-06 ENCOUNTER — Other Ambulatory Visit: Payer: Medicare HMO

## 2018-11-06 DIAGNOSIS — E782 Mixed hyperlipidemia: Secondary | ICD-10-CM | POA: Diagnosis not present

## 2018-11-06 DIAGNOSIS — K7030 Alcoholic cirrhosis of liver without ascites: Secondary | ICD-10-CM | POA: Diagnosis not present

## 2018-11-06 DIAGNOSIS — K219 Gastro-esophageal reflux disease without esophagitis: Secondary | ICD-10-CM | POA: Diagnosis not present

## 2018-11-06 DIAGNOSIS — E119 Type 2 diabetes mellitus without complications: Secondary | ICD-10-CM | POA: Diagnosis not present

## 2018-11-06 LAB — BAYER DCA HB A1C WAIVED: HB A1C (BAYER DCA - WAIVED): 5.5 % (ref <7.0)

## 2018-11-07 LAB — CMP14+EGFR
ALT: 22 IU/L (ref 0–44)
AST: 33 IU/L (ref 0–40)
Albumin/Globulin Ratio: 1.3 (ref 1.2–2.2)
Albumin: 3.5 g/dL — ABNORMAL LOW (ref 3.8–4.9)
Alkaline Phosphatase: 136 IU/L — ABNORMAL HIGH (ref 39–117)
BUN/Creatinine Ratio: 8 — ABNORMAL LOW (ref 9–20)
BUN: 6 mg/dL (ref 6–24)
Bilirubin Total: 0.8 mg/dL (ref 0.0–1.2)
CO2: 22 mmol/L (ref 20–29)
Calcium: 8.8 mg/dL (ref 8.7–10.2)
Chloride: 110 mmol/L — ABNORMAL HIGH (ref 96–106)
Creatinine, Ser: 0.77 mg/dL (ref 0.76–1.27)
GFR calc Af Amer: 116 mL/min/{1.73_m2} (ref >59)
GFR calc non Af Amer: 100 mL/min/{1.73_m2} (ref >59)
Globulin, Total: 2.6 g/dL (ref 1.5–4.5)
Glucose: 91 mg/dL (ref 65–99)
Potassium: 4 mmol/L (ref 3.5–5.2)
Sodium: 141 mmol/L (ref 134–144)
Total Protein: 6.1 g/dL (ref 6.0–8.5)

## 2018-11-07 LAB — CBC WITH DIFFERENTIAL/PLATELET
Basophils Absolute: 0.1 10*3/uL (ref 0.0–0.2)
Basos: 2 %
EOS (ABSOLUTE): 0.3 10*3/uL (ref 0.0–0.4)
Eos: 7 %
Hematocrit: 41.3 % (ref 37.5–51.0)
Hemoglobin: 14.4 g/dL (ref 13.0–17.7)
Immature Grans (Abs): 0 10*3/uL (ref 0.0–0.1)
Immature Granulocytes: 0 %
Lymphocytes Absolute: 1.1 10*3/uL (ref 0.7–3.1)
Lymphs: 28 %
MCH: 30.3 pg (ref 26.6–33.0)
MCHC: 34.9 g/dL (ref 31.5–35.7)
MCV: 87 fL (ref 79–97)
Monocytes Absolute: 0.5 10*3/uL (ref 0.1–0.9)
Monocytes: 12 %
Neutrophils Absolute: 2.1 10*3/uL (ref 1.4–7.0)
Neutrophils: 51 %
Platelets: 92 10*3/uL — CL (ref 150–450)
RBC: 4.75 x10E6/uL (ref 4.14–5.80)
RDW: 13.6 % (ref 11.6–15.4)
WBC: 4.1 10*3/uL (ref 3.4–10.8)

## 2018-11-07 LAB — LIPID PANEL
Chol/HDL Ratio: 3 ratio (ref 0.0–5.0)
Cholesterol, Total: 121 mg/dL (ref 100–199)
HDL: 41 mg/dL (ref >39)
LDL Calculated: 65 mg/dL (ref 0–99)
Triglycerides: 73 mg/dL (ref 0–149)
VLDL Cholesterol Cal: 15 mg/dL (ref 5–40)

## 2018-11-07 LAB — AFP TUMOR MARKER: AFP, Serum, Tumor Marker: 2.3 ng/mL (ref 0.0–8.3)

## 2018-11-07 LAB — TSH: TSH: 1.08 u[IU]/mL (ref 0.450–4.500)

## 2018-11-07 LAB — AMMONIA: Ammonia: 88 ug/dL (ref 40–200)

## 2018-11-08 NOTE — Progress Notes (Signed)
Hello [place],  Your lab result is normal.Some minor variations that are not significant are commonly marked abnormal, but do not represent any medical problem for you.  Best regards, Claretta Fraise, M.D.

## 2018-11-27 DIAGNOSIS — H53021 Refractive amblyopia, right eye: Secondary | ICD-10-CM | POA: Diagnosis not present

## 2018-11-27 DIAGNOSIS — E119 Type 2 diabetes mellitus without complications: Secondary | ICD-10-CM | POA: Diagnosis not present

## 2018-11-27 DIAGNOSIS — H5200 Hypermetropia, unspecified eye: Secondary | ICD-10-CM | POA: Diagnosis not present

## 2018-11-27 DIAGNOSIS — I10 Essential (primary) hypertension: Secondary | ICD-10-CM | POA: Diagnosis not present

## 2018-11-27 DIAGNOSIS — E7800 Pure hypercholesterolemia, unspecified: Secondary | ICD-10-CM | POA: Diagnosis not present

## 2018-11-27 DIAGNOSIS — H2513 Age-related nuclear cataract, bilateral: Secondary | ICD-10-CM | POA: Diagnosis not present

## 2018-11-27 LAB — HM DIABETES EYE EXAM

## 2018-12-04 ENCOUNTER — Telehealth: Payer: Self-pay | Admitting: Family Medicine

## 2018-12-04 NOTE — Chronic Care Management (AMB) (Signed)
Chronic Care Management   Note  12/04/2018 Name: Spencer Stanley MRN: [id number] DOB: 04-15-1961  Spencer Stanley is a 58 y.o. year old male who is a primary care patient of Stacks, Cletus Gash, MD. I reached out to [name] by phone today in response to a referral sent by [name].    [name] was given information about Chronic Care Management services today including:  1. CCM service includes personalized support from designated clinical staff supervised by his physician, including individualized plan of care and coordination with other care providers 2. 24/7 contact phone numbers for assistance for urgent and routine care needs. 3. Service will only be billed when office clinical staff spend 20 minutes or more in a month to coordinate care. 4. Only one practitioner may furnish and bill the service in a calendar month. 5. The patient may stop CCM services at any time (effective at the end of the month) by phone call to the office staff. 6. The patient will be responsible for cost sharing (co-pay) of up to 20% of the service fee (after annual deductible is met).  Patient agreed to services and verbal consent obtained.   Follow up plan: Telephone appointment with CCM team member scheduled for: 12/24/2018  [place]  ??[email or web address]   ??[phone]

## 2018-12-17 DIAGNOSIS — K006 Disturbances in tooth eruption: Secondary | ICD-10-CM | POA: Diagnosis not present

## 2018-12-24 ENCOUNTER — Ambulatory Visit (INDEPENDENT_AMBULATORY_CARE_PROVIDER_SITE_OTHER): Payer: Medicare HMO | Admitting: *Deleted

## 2018-12-24 DIAGNOSIS — E119 Type 2 diabetes mellitus without complications: Secondary | ICD-10-CM

## 2018-12-24 DIAGNOSIS — F17200 Nicotine dependence, unspecified, uncomplicated: Secondary | ICD-10-CM | POA: Insufficient documentation

## 2018-12-24 DIAGNOSIS — K7030 Alcoholic cirrhosis of liver without ascites: Secondary | ICD-10-CM

## 2018-12-24 NOTE — Patient Instructions (Addendum)
Visit Information  Goals Addressed            This Visit's Progress     Patient Stated   . "I want to keep my liver as healthy as possible" (pt-stated)       Current Barriers:  . Chronic Disease Management support and education needs related to cirrhosis.  Nurse Case Manager Clinical Goal(s):  [name], patient will attend all scheduled medical appointments: PCP appt in 04/2019 and liver specialist at [facility] . Over the next 90 days, the patient will demonstrate ongoing self health care management ability as evidenced by liver functions and other lab values remaining stable*  Interventions:  . Evaluation of current treatment plan related to cirrhosis and patient's adherence to plan as established by provider. . Reviewed medications with patient and discussed Xifaxan . Discussed plans with patient for ongoing care management follow up and provided patient with direct contact information for care management team  Patient Self Care Activities:  . Performs ADL's independently . Performs IADL's independently  Initial goal documentation     . "I would like to quit smoking" (pt-stated)       Current Barriers:  . Chronic Disease Management support and education needs related to smoking cessation  Nurse Case Manager Clinical Goal(s):  Marland Kitchen Over the next 7 days, patient will verbalize understanding of plan for smoking cessation  Interventions:  . Discussed current and past tobacco use o Down to 1/2ppd from 1.5 ppd o Tried nicotine gum and it didn't help o Has not tried Rx meds . Smoking cessation instruction/counseling given:  counseled patient on the dangers of tobacco use, advised patient to stop smoking, and reviewed strategies to maximize success  . The patient was counseled on the dangers of tobacco use, and was advised to quit.  Reviewed strategies to maximize success, including removing cigarettes and smoking materials from environment, substitution of other forms of  reinforcement, written materials, and pharmacotherapy (Chantix).  . Discussed plans with patient for ongoing care management follow up and provided patient with direct contact information for care management team . Provided patient with printed EMMI educational materials by mail related to smoking cessation.  Patient Self Care Activities:  . Performs ADL's independently . Performs IADL's independently  Initial goal documentation            Spencer Stanley was given information about Chronic Care Management services today including:  1. CCM service includes personalized support from designated clinical staff supervised by his physician, including individualized plan of care and coordination with other care providers 2. 24/7 contact phone numbers for assistance for urgent and routine care needs. 3. Service will only be billed when office clinical staff spend 20 minutes or more in a month to coordinate care. 4. Only one practitioner may furnish and bill the service in a calendar month. 5. The patient may stop CCM services at any time (effective at the end of the month) by phone call to the office staff. 6. The patient will be responsible for cost sharing (co-pay) of up to 20% of the service fee (after annual deductible is met).  Patient agreed to services and verbal consent obtained.    Print copy of patient instructions provided by mail.  Plan:  The care management team will reach out to the patient again over the next 30 days.   RNCM will mail smoking cessation educational materials   [employer] BSN, RN-BC [place] /  [place] Management Direct Dial: [phone]

## 2018-12-24 NOTE — Chronic Care Management (AMB) (Signed)
Chronic Care Management   Initial Visit Note  12/24/2018 Name: Spencer Stanley MRN: [id number] DOB: 1961-04-21  Referred by: Spencer Fraise, MD Reason for referral : Chronic Care Management (RNCM Initial Visit)   Spencer Stanley is a 58 y.o. year old male who is a primary care patient of Stacks, Spencer Gash, MD. The CCM team was consulted for assistance with chronic disease management and care coordination needs.   Review of patient status, including review of consultants reports, relevant laboratory and other test results, and collaboration with appropriate care team members and the patient's provider was performed as part of comprehensive patient evaluation and provision of chronic care management services.    SDOH (Social Determinants of Health) screening performed today. See Care Plan Entry related to challenges with: Tobacco Use Physical Activity   Subjective: "I would like to quit smoking".  I spoke with Spencer Stanley by telephone today regarding his chronic medical conditions, which include diabetes, tobacco dependence, and cirrhosis and about his chronic care needs. He is followed at [facility] for cirrhosis and [facility] for primary care.   Objective:   Social History   Tobacco Use  Smoking Status Current Every Day Smoker  . Packs/day: 0.50  . Years: 33.00  . Pack years: 16.50  Smokeless Tobacco Former Systems developer  . Types: Chew  . Quit date: 16  Tobacco Comment   03/21/2018 cut back to 1/2 ppd     Chemistry      Component Value Date/Time   NA 141 11/06/2018 1416   K 4.0 11/06/2018 1416   CL 110 (H) 11/06/2018 1416   CO2 22 11/06/2018 1416   BUN 6 11/06/2018 1416   CREATININE 0.77 11/06/2018 1416      Component Value Date/Time   CALCIUM 8.8 11/06/2018 1416   ALKPHOS 136 (H) 11/06/2018 1416   AST 33 11/06/2018 1416   ALT 22 11/06/2018 1416   BILITOT 0.8 11/06/2018 1416      Goals Addressed            This Visit's Progress     Patient Stated   . "I  want to keep my liver as healthy as possible" (pt-stated)       Current Barriers:  . Chronic Disease Management support and education needs related to cirrhosis.  Nurse Case Manager Clinical Goal(s):  [name], patient will attend all scheduled medical appointments: PCP appt in 04/2019 and liver specialist at [facility] . Over the next 90 days, the patient will demonstrate ongoing self health care management ability as evidenced by liver functions and other lab values remaining stable*  Interventions:  . Evaluation of current treatment plan related to cirrhosis and patient's adherence to plan as established by provider. . Reviewed medications with patient and discussed Xifaxan . Discussed plans with patient for ongoing care management follow up and provided patient with direct contact information for care management team  Patient Self Care Activities:  . Performs ADL's independently . Performs IADL's independently  Initial goal documentation     . "I would like to quit smoking" (pt-stated)       Current Barriers:  . Chronic Disease Management support and education needs related to smoking cessation  Nurse Case Manager Clinical Goal(s):  Marland Kitchen Over the next 7 days, patient will verbalize understanding of plan for smoking cessation  Interventions:  . Discussed current and past tobacco use o Down to 1/2ppd from 1.5 ppd o Tried nicotine gum and it didn't help  o Has not tried Rx meds . Smoking cessation instruction/counseling given:  counseled patient on the dangers of tobacco use, advised patient to stop smoking, and reviewed strategies to maximize success  . The patient was counseled on the dangers of tobacco use, and was advised to quit.  Reviewed strategies to maximize success, including removing cigarettes and smoking materials from environment, substitution of other forms of reinforcement, written materials, and pharmacotherapy (Chantix).  . Discussed plans with patient for  ongoing care management follow up and provided patient with direct contact information for care management team . Provided patient with printed EMMI educational materials by mail related to smoking cessation.  Patient Self Care Activities:  . Performs ADL's independently . Performs IADL's independently  Initial goal documentation        Plan:  The care management team will reach out to the patient again over the next 30 days.   RNCM will mail smoking cessation educational materials   Spencer Stanley BSN, RN-BC [place] / [place] [facility] Direct Dial: [phone]

## 2019-01-07 ENCOUNTER — Other Ambulatory Visit: Payer: Self-pay | Admitting: Family Medicine

## 2019-01-16 DIAGNOSIS — F17210 Nicotine dependence, cigarettes, uncomplicated: Secondary | ICD-10-CM | POA: Diagnosis not present

## 2019-01-16 DIAGNOSIS — K7030 Alcoholic cirrhosis of liver without ascites: Secondary | ICD-10-CM | POA: Diagnosis not present

## 2019-01-16 DIAGNOSIS — I8500 Esophageal varices without bleeding: Secondary | ICD-10-CM | POA: Diagnosis not present

## 2019-01-22 ENCOUNTER — Ambulatory Visit: Payer: Medicare HMO

## 2019-01-22 NOTE — Patient Instructions (Signed)
Visit Information  Goals Addressed            This Visit's Progress     Patient Stated   . "I want to keep my liver as healthy as possible" (pt-stated)       Current Barriers:  . Financial constraints . Xifaxan manufacturer discontinued prescription assistance  Nurse Case Manager Clinical Goal(s):  Marland Kitchen Over the next 30 days, patient will continue to receive Xifaxan for an affordable cost  Interventions:  . Discussed medications: Xifaxan o Patient was receiveing Xifaxan for free from manufacturer  o Patient out-of-pocket cost will be approx $900 after insurance if manufacturer discontinues assistance o Patient cannot afford this amount o Specialist is appealing . Advised patient of potential grant funding if manufacturer still denies assistance . Asked patient to let me know when he receives a response  Patient Self Care Activities:  . Performs ADL's independently . Performs IADL's independently  Please see past updates related to this goal by clicking on the "Past Updates" button in the selected goal      . "I would like to quit smoking" (pt-stated)       Current Barriers:  . Chronic Disease Management support and education needs related to smoking cessation  Nurse Case Manager Clinical Goal(s):  Marland Kitchen Over the next 30 days, patient will demonstrate improved health management independence as evidenced bydecreasing the amount of cigarettes smoked in one day to less than 10.   Interventions:  . Discussed smoking routine o Down to 10 cigarettes a day . Encouraged to cut back even more and plan out times to smoke in order to decrease the total number of cigarettes in one day . Discussed patient's expectations    Patient Self Care Activities:  . Performs ADL's independently . Performs IADL's independently  Please see past updates related to this goal by clicking on the "Past Updates" button in the selected goal         The patient verbalized understanding of instructions  provided today and declined a print copy of patient instruction materials.    Plan The care management team will reach out to the patient again over the next 14 days.    Chong Sicilian BSN, RN-BC Embedded Chronic Care Manager [facility] / [place] Management Direct Dial: [phone]

## 2019-01-22 NOTE — Chronic Care Management (AMB) (Signed)
Chronic Care Management   Follow Up Note   01/22/2019 Name: Spencer Stanley MRN: [id number] DOB: 1960-08-03  Referred by: Claretta Fraise, MD Reason for referral : Chronic Care Management (RNCM follow up)   Spencer Stanley is a 58 y.o. year old male who is a primary care patient of Stacks, Cletus Gash, MD. The CCM team was consulted for assistance with chronic disease management and care coordination needs.    Review of patient status, including review of consultants reports, relevant laboratory and other test results, and collaboration with appropriate care team members and the patient's provider was performed as part of comprehensive patient evaluation and provision of chronic care management services.    I spoke with Spencer Stanley today. His primary concern is the letter he received from the manufacturer of [place] stating that his prescription assistance for the drug has been discontinued because he has insurance coverage now. Spencer Stanley states that he has had insurance the entire time and that the manufacturer was aware of that. He is concerned because this medication is vital for his wellbeing but the cost is prohibitive.    Outpatient Encounter Medications as of 01/22/2019  Medication Sig  . ACCU-CHEK SOFTCLIX LANCETS lancets Use to check blood sugars three times daily  . albuterol (PROVENTIL HFA;VENTOLIN HFA) 108 (90 Base) MCG/ACT inhaler Inhale 2 puffs into the lungs every 6 (six) hours as needed for wheezing or shortness of breath.  . Alcohol Swabs (B-D SINGLE USE SWABS REGULAR) PADS Use to check blood sugars three times daily  . atorvastatin (LIPITOR) 10 MG tablet TAKE 1 TABLET EVERY DAY FOR CHOLESTEROL  . Blood Glucose Calibration (ACCU-CHEK AVIVA) SOLN Use with Accu-Chek meter  . CALCIUM-MAGNESIUM-ZINC PO Take 1 capsule by mouth 2 (two) times daily.  Marland Kitchen FLUoxetine (PROZAC) 20 MG capsule TAKE 1 CAPSULE EVERY DAY  . glucose blood test strip CHECK BLOOD SUGAR THREE TIMES DAILY  . lactulose (CHRONULAC) 10  GM/15ML solution Take 45 g by mouth 3 (three) times daily.  . metFORMIN (GLUCOPHAGE-XR) 750 MG 24 hr tablet TAKE 1 TABLET EVERY DAY WITH BREAKFAST  . minocycline (MINOCIN) 100 MG capsule TAKE 1 CAPSULE TWICE DAILY  . Multiple Vitamins-Minerals (ZINC PO) Take by mouth.  . multivitamin (ONE-A-DAY MEN'S) TABS tablet Take 1 tablet by mouth daily.  . pantoprazole (PROTONIX) 40 MG tablet TAKE 1 TABLET EVERY DAY  . rifaximin (XIFAXAN) 550 MG TABS tablet Take 1 tablet (550 mg total) by mouth 2 (two) times daily.  Marland Kitchen spironolactone (ALDACTONE) 25 MG tablet TAKE 1 TABLET EVERY DAY  . tadalafil (ADCIRCA/CIALIS) 20 MG tablet Take 0.5-1 tablets (10-20 mg total) by mouth every other day as needed for erectile dysfunction.   No facility-administered encounter medications on file as of 01/22/2019.      Goals Addressed            This Visit's Progress     Patient Stated   . "I want to keep my liver as healthy as possible" (pt-stated)       Current Barriers:  . Financial constraints . Xifaxan manufacturer discontinued prescription assistance  Nurse Case Manager Clinical Goal(s):  Marland Kitchen Over the next 30 days, patient will continue to receive Xifaxan for an affordable cost  Interventions:  . Discussed medications: Xifaxan o Patient was receiveing Xifaxan for free from manufacturer  o Patient out-of-pocket cost will be approx $900 after insurance if manufacturer discontinues assistance o Patient cannot afford this amount o Specialist is appealing . Advised patient of potential grant funding if  manufacturer still denies assistance . Asked patient to let me know when he receives a response  Patient Self Care Activities:  . Performs ADL's independently . Performs IADL's independently  Please see past updates related to this goal by clicking on the "Past Updates" button in the selected goal      . "I would like to quit smoking" (pt-stated)       Current Barriers:  . Chronic Disease Management support  and education needs related to smoking cessation  Nurse Case Manager Clinical Goal(s):  Marland Kitchen Over the next 30 days, patient will demonstrate improved health management independence as evidenced bydecreasing the amount of cigarettes smoked in one day to less than 10.   Interventions:  . Discussed smoking routine o Down to 10 cigarettes a day . Encouraged to cut back even more and plan out times to smoke in order to decrease the total number of cigarettes in one day . Discussed patient's expectations    Patient Self Care Activities:  . Performs ADL's independently . Performs IADL's independently  Please see past updates related to this goal by clicking on the "Past Updates" button in the selected goal         Plan The care management team will reach out to the patient again over the next 14 days.    Chong Sicilian BSN, RN-BC Embedded Chronic Care Manager [facility] / [place] [facility] Direct Dial: [phone]

## 2019-01-28 ENCOUNTER — Other Ambulatory Visit: Payer: Self-pay | Admitting: Family Medicine

## 2019-02-14 ENCOUNTER — Encounter: Payer: Self-pay | Admitting: *Deleted

## 2019-02-25 DIAGNOSIS — K7460 Unspecified cirrhosis of liver: Secondary | ICD-10-CM | POA: Diagnosis not present

## 2019-03-03 ENCOUNTER — Other Ambulatory Visit: Payer: Self-pay

## 2019-03-04 ENCOUNTER — Ambulatory Visit (INDEPENDENT_AMBULATORY_CARE_PROVIDER_SITE_OTHER): Payer: Medicare HMO

## 2019-03-04 DIAGNOSIS — Z23 Encounter for immunization: Secondary | ICD-10-CM

## 2019-03-06 ENCOUNTER — Other Ambulatory Visit: Payer: Self-pay | Admitting: Family Medicine

## 2019-05-05 ENCOUNTER — Other Ambulatory Visit: Payer: Self-pay

## 2019-05-05 ENCOUNTER — Encounter: Payer: Self-pay | Admitting: Family Medicine

## 2019-05-05 ENCOUNTER — Ambulatory Visit (INDEPENDENT_AMBULATORY_CARE_PROVIDER_SITE_OTHER): Payer: Medicare HMO | Admitting: Family Medicine

## 2019-05-05 VITALS — BP 72/47 | HR 93 | Temp 98.6°F | Resp 20 | Ht 69.0 in | Wt 167.0 lb

## 2019-05-05 DIAGNOSIS — I8510 Secondary esophageal varices without bleeding: Secondary | ICD-10-CM

## 2019-05-05 DIAGNOSIS — K7030 Alcoholic cirrhosis of liver without ascites: Secondary | ICD-10-CM

## 2019-05-05 DIAGNOSIS — F17200 Nicotine dependence, unspecified, uncomplicated: Secondary | ICD-10-CM | POA: Diagnosis not present

## 2019-05-05 DIAGNOSIS — D696 Thrombocytopenia, unspecified: Secondary | ICD-10-CM

## 2019-05-05 DIAGNOSIS — E7220 Disorder of urea cycle metabolism, unspecified: Secondary | ICD-10-CM | POA: Diagnosis not present

## 2019-05-05 DIAGNOSIS — Z125 Encounter for screening for malignant neoplasm of prostate: Secondary | ICD-10-CM | POA: Diagnosis not present

## 2019-05-05 DIAGNOSIS — E119 Type 2 diabetes mellitus without complications: Secondary | ICD-10-CM

## 2019-05-05 MED ORDER — FLUOXETINE HCL 20 MG PO CAPS: 20.0000 mg | ORAL_CAPSULE | Freq: Every day | ORAL | 0 refills | Status: DC

## 2019-05-05 MED ORDER — SPIRONOLACTONE 25 MG PO TABS: 25.0000 mg | ORAL_TABLET | Freq: Every day | ORAL | 1 refills | Status: DC

## 2019-05-05 MED ORDER — MINOCYCLINE HCL 100 MG PO CAPS: 100.0000 mg | ORAL_CAPSULE | Freq: Two times a day (BID) | ORAL | 0 refills | Status: DC

## 2019-05-05 MED ORDER — TADALAFIL 20 MG PO TABS: 10.0000 mg | ORAL_TABLET | ORAL | 5 refills | Status: DC | PRN

## 2019-05-05 MED ORDER — PANTOPRAZOLE SODIUM 40 MG PO TBEC: 40.0000 mg | DELAYED_RELEASE_TABLET | Freq: Every day | ORAL | 1 refills | Status: DC

## 2019-05-05 MED ORDER — METFORMIN HCL ER 750 MG PO TB24: ORAL_TABLET | ORAL | 1 refills | Status: DC

## 2019-05-05 MED ORDER — ATORVASTATIN CALCIUM 10 MG PO TABS: 10.0000 mg | ORAL_TABLET | Freq: Every day | ORAL | 1 refills | Status: DC

## 2019-05-05 NOTE — Progress Notes (Signed)
Subjective:  Patient ID: Marcquise Furness, male    DOB: 24-Oct-1960  Age: 58 y.o. MRN: [id number]  CC: Medical Management of Chronic Issues (Losing weight)   HPI Sergi Pickron presents forFollow-up of diabetes. Patient checks blood sugar at home.   90 fasting and 110 postprandial Patient denies symptoms such as polyuria, polydipsia, excessive hunger, nausea No significant hypoglycemic spells noted. Medications reviewed. Pt reports taking them regularly without complication/adverse reaction being reported today.  Checking feet daily. Last eye appt was UTD  History Jaicion has a past medical history of Anxiety, Cirrhosis ([place]), Esophageal varices ([place]), Lyme disease, and [facility] spotted fever.   He has a past surgical history that includes Hernia repair and Esophageal varice ligation.   His family history includes Aneurysm in his mother; Cancer in his father and mother; Dementia in his father; Diabetes in his sister; Heart failure in his father.He reports that he has been smoking. He has a 16.50 pack-year smoking history. He quit smokeless tobacco use about 39 years ago.  His smokeless tobacco use included chew. He reports that he does not drink alcohol or use drugs.  Current Outpatient Medications on File Prior to Visit  Medication Sig Dispense Refill  . Accu-Chek Softclix Lancets lancets USE TO CHECK BLOOD SUGARS THREE TIMES DAILY 300 each 3  . albuterol (PROVENTIL HFA;VENTOLIN HFA) 108 (90 Base) MCG/ACT inhaler Inhale 2 puffs into the lungs every 6 (six) hours as needed for wheezing or shortness of breath. 54 g 3  . Alcohol Swabs (B-D SINGLE USE SWABS REGULAR) PADS Use to check blood sugars three times daily Dx E11.9 300 each 3  . Blood Glucose Calibration (ACCU-CHEK AVIVA) SOLN Use with Accu-Chek meter 1 each 0  . CALCIUM-MAGNESIUM-ZINC PO Take 1 capsule by mouth 2 (two) times daily.    Marland Kitchen glucose blood test strip CHECK BLOOD SUGAR THREE TIMES DAILY 300 each 3  . lactulose (CHRONULAC) 10  GM/15ML solution Take 45 g by mouth 3 (three) times daily.    . Multiple Vitamins-Minerals (ZINC PO) Take by mouth.    . multivitamin (ONE-A-DAY MEN'S) TABS tablet Take 1 tablet by mouth daily.    . rifaximin (XIFAXAN) 550 MG TABS tablet Take 1 tablet (550 mg total) by mouth 2 (two) times daily. 60 tablet 5   No current facility-administered medications on file prior to visit.    ROS Review of Systems  Constitutional: Negative.   HENT: Negative.   Eyes: Negative for visual disturbance.  Respiratory: Negative for cough and shortness of breath.   Cardiovascular: Negative for chest pain and leg swelling.  Gastrointestinal: Negative for abdominal pain, diarrhea, nausea and vomiting.  Genitourinary: Negative for difficulty urinating.  Musculoskeletal: Negative for arthralgias and myalgias.  Skin: Negative for rash.  Neurological: Negative for headaches.  Psychiatric/Behavioral: Negative for sleep disturbance.    Objective:  BP (!) 72/47   Pulse 93   Temp 98.6 F (37 C) (Temporal)   Resp 20   Ht 5\' 9"  (1.753 m)   Wt 167 lb (75.8 kg)   SpO2 97%   BMI 24.66 kg/m   BP Readings from Last 3 Encounters:  05/05/19 (!) 72/47  04/15/18 (!) 96/54  03/21/18 (!) 85/56    Wt Readings from Last 3 Encounters:  05/05/19 167 lb (75.8 kg)  04/15/18 179 lb (81.2 kg)  03/21/18 177 lb (80.3 kg)     Physical Exam Vitals reviewed.  Constitutional:      Appearance: He is well-developed.  HENT:  Head: Normocephalic and atraumatic.     Right Ear: Tympanic membrane and external ear normal. No decreased hearing noted.     Left Ear: Tympanic membrane and external ear normal. No decreased hearing noted.     Mouth/Throat:     Pharynx: No oropharyngeal exudate or posterior oropharyngeal erythema.  Eyes:     Pupils: Pupils are equal, round, and reactive to light.  Cardiovascular:     Rate and Rhythm: Normal rate and regular rhythm.     Heart sounds: No murmur.  Pulmonary:     Effort: No  respiratory distress.     Breath sounds: Normal breath sounds.  Abdominal:     General: Bowel sounds are normal.     Palpations: Abdomen is soft. There is no mass.     Tenderness: There is no abdominal tenderness.  Musculoskeletal:     Cervical back: Normal range of motion and neck supple.       Assessment & Plan:   Keundre was seen today for medical management of chronic issues.  Diagnoses and all orders for this visit:  Screening for prostate cancer -     PSA Total (Reflex To Free)  Alcoholic cirrhosis of liver without ascites ([place]) -     CBC -     CMP -     INR -     Ammonia  Tobacco dependence -     CMP -     CXR; Future  Esophageal varices in alcoholic cirrhosis (HCC) -     CBC -     CMP  Hyperammonemia (HCC) -     CMP  Thrombocytopenia (H[facility]) -     CBC -     CMP  Type 2 diabetes mellitus without complication, without long-term current use of insulin ([facility]) -     A1c -     CMP -     Lipid -     TSH  Other orders -     atorvastatin (LIPITOR) 10 MG tablet; Take 1 tablet (10 mg total) by mouth daily. -     FLUoxetine (PROZAC) 20 MG capsule; Take 1 capsule (20 mg total) by mouth daily. -     metFORMIN (GLUCOPHAGE-XR) 750 MG 24 hr tablet; TAKE 1 TABLET EVERY DAY WITH BREAKFAST -     minocycline (MINOCIN) 100 MG capsule; Take 1 capsule (100 mg total) by mouth 2 (two) times daily. -     pantoprazole (PROTONIX) 40 MG tablet; Take 1 tablet (40 mg total) by mouth daily. -     spironolactone (ALDACTONE) 25 MG tablet; Take 1 tablet (25 mg total) by mouth daily. -     tadalafil (CIALIS) 20 MG tablet; Take 0.5-1 tablets (10-20 mg total) by mouth every other day as needed for erectile dysfunction.      I have changed Kaylyn Layer atorvastatin, FLUoxetine, minocycline, pantoprazole, spironolactone, and tadalafil. I am also having him maintain his multivitamin, CALCIUM-MAGNESIUM-ZINC PO, rifaximin, Multiple Vitamins-Minerals (ZINC PO), lactulose, albuterol, Accu-Chek  Aviva, glucose blood, B-D SINGLE USE SWABS REGULAR, Accu-Chek Softclix Lancets, and metFORMIN.  Meds ordered this encounter  Medications  . atorvastatin (LIPITOR) 10 MG tablet    Sig: Take 1 tablet (10 mg total) by mouth daily.    Dispense:  90 tablet    Refill:  1  . FLUoxetine (PROZAC) 20 MG capsule    Sig: Take 1 capsule (20 mg total) by mouth daily.    Dispense:  90 capsule    Refill:  0  .  metFORMIN (GLUCOPHAGE-XR) 750 MG 24 hr tablet    Sig: TAKE 1 TABLET EVERY DAY WITH BREAKFAST    Dispense:  90 tablet    Refill:  1  . minocycline (MINOCIN) 100 MG capsule    Sig: Take 1 capsule (100 mg total) by mouth 2 (two) times daily.    Dispense:  180 capsule    Refill:  0  . pantoprazole (PROTONIX) 40 MG tablet    Sig: Take 1 tablet (40 mg total) by mouth daily.    Dispense:  90 tablet    Refill:  1  . spironolactone (ALDACTONE) 25 MG tablet    Sig: Take 1 tablet (25 mg total) by mouth daily.    Dispense:  90 tablet    Refill:  1  . tadalafil (CIALIS) 20 MG tablet    Sig: Take 0.5-1 tablets (10-20 mg total) by mouth every other day as needed for erectile dysfunction.    Dispense:  20 tablet    Refill:  5     Follow-up: Return in about 6 months (around 11/03/2019), or if symptoms worsen or fail to improve.  Claretta Fraise, M.D.

## 2019-05-06 ENCOUNTER — Other Ambulatory Visit: Payer: Medicare HMO

## 2019-05-06 ENCOUNTER — Ambulatory Visit (INDEPENDENT_AMBULATORY_CARE_PROVIDER_SITE_OTHER): Payer: Medicare HMO

## 2019-05-06 DIAGNOSIS — E782 Mixed hyperlipidemia: Secondary | ICD-10-CM

## 2019-05-06 DIAGNOSIS — D696 Thrombocytopenia, unspecified: Secondary | ICD-10-CM | POA: Diagnosis not present

## 2019-05-06 DIAGNOSIS — K7030 Alcoholic cirrhosis of liver without ascites: Secondary | ICD-10-CM

## 2019-05-06 DIAGNOSIS — Z720 Tobacco use: Secondary | ICD-10-CM | POA: Diagnosis not present

## 2019-05-06 DIAGNOSIS — F17200 Nicotine dependence, unspecified, uncomplicated: Secondary | ICD-10-CM

## 2019-05-06 DIAGNOSIS — E119 Type 2 diabetes mellitus without complications: Secondary | ICD-10-CM | POA: Diagnosis not present

## 2019-05-06 DIAGNOSIS — J449 Chronic obstructive pulmonary disease, unspecified: Secondary | ICD-10-CM | POA: Diagnosis not present

## 2019-05-06 DIAGNOSIS — E7220 Disorder of urea cycle metabolism, unspecified: Secondary | ICD-10-CM | POA: Diagnosis not present

## 2019-05-06 DIAGNOSIS — I8510 Secondary esophageal varices without bleeding: Secondary | ICD-10-CM | POA: Diagnosis not present

## 2019-05-06 DIAGNOSIS — F17210 Nicotine dependence, cigarettes, uncomplicated: Secondary | ICD-10-CM | POA: Diagnosis not present

## 2019-05-06 DIAGNOSIS — Z125 Encounter for screening for malignant neoplasm of prostate: Secondary | ICD-10-CM | POA: Diagnosis not present

## 2019-05-06 LAB — BAYER DCA HB A1C WAIVED: HB A1C (BAYER DCA - WAIVED): 5.6 % (ref <7.0)

## 2019-05-07 LAB — SPECIMEN STATUS

## 2019-05-07 LAB — AMMONIA: Ammonia: 121 ug/dL (ref 40–200)

## 2019-05-07 LAB — SPECIMEN STATUS REPORT

## 2019-05-08 LAB — CMP14+EGFR
ALT: 16 IU/L (ref 0–44)
AST: 33 IU/L (ref 0–40)
Albumin/Globulin Ratio: 1.5 (ref 1.2–2.2)
Albumin: 3.8 g/dL (ref 3.8–4.9)
Alkaline Phosphatase: 138 IU/L — ABNORMAL HIGH (ref 39–117)
BUN/Creatinine Ratio: 13 (ref 9–20)
BUN: 11 mg/dL (ref 6–24)
Bilirubin Total: 0.8 mg/dL (ref 0.0–1.2)
CO2: 20 mmol/L (ref 20–29)
Calcium: 9.6 mg/dL (ref 8.7–10.2)
Chloride: 110 mmol/L — ABNORMAL HIGH (ref 96–106)
Creatinine, Ser: 0.88 mg/dL (ref 0.76–1.27)
GFR calc Af Amer: 109 mL/min/{1.73_m2} (ref >59)
GFR calc non Af Amer: 94 mL/min/{1.73_m2} (ref >59)
Globulin, Total: 2.5 g/dL (ref 1.5–4.5)
Glucose: 108 mg/dL — ABNORMAL HIGH (ref 65–99)
Potassium: 3.8 mmol/L (ref 3.5–5.2)
Sodium: 146 mmol/L — ABNORMAL HIGH (ref 134–144)
Total Protein: 6.3 g/dL (ref 6.0–8.5)

## 2019-05-08 LAB — CBC WITH DIFFERENTIAL/PLATELET
Basophils Absolute: 0.1 10*3/uL (ref 0.0–0.2)
Basos: 2 %
EOS (ABSOLUTE): 0.2 10*3/uL (ref 0.0–0.4)
Eos: 4 %
Hematocrit: 42 % (ref 37.5–51.0)
Hemoglobin: 13.6 g/dL (ref 13.0–17.7)
Immature Grans (Abs): 0 10*3/uL (ref 0.0–0.1)
Immature Granulocytes: 0 %
Lymphocytes Absolute: 1.1 10*3/uL (ref 0.7–3.1)
Lymphs: 26 %
MCH: 29.5 pg (ref 26.6–33.0)
MCHC: 32.4 g/dL (ref 31.5–35.7)
MCV: 91 fL (ref 79–97)
Monocytes Absolute: 0.6 10*3/uL (ref 0.1–0.9)
Monocytes: 14 %
Neutrophils Absolute: 2.3 10*3/uL (ref 1.4–7.0)
Neutrophils: 54 %
Platelets: 92 10*3/uL — CL (ref 150–450)
RBC: 4.61 x10E6/uL (ref 4.14–5.80)
RDW: 13.6 % (ref 11.6–15.4)
WBC: 4.3 10*3/uL (ref 3.4–10.8)

## 2019-05-08 LAB — TSH: TSH: 0.753 u[IU]/mL (ref 0.450–4.500)

## 2019-05-08 LAB — PSA TOTAL (REFLEX TO FREE): Prostate Specific Ag, Serum: 0.9 ng/mL (ref 0.0–4.0)

## 2019-05-08 LAB — PROTIME-INR
INR: 1.1 (ref 0.9–1.2)
Prothrombin Time: 11.8 s (ref 9.1–12.0)

## 2019-05-08 LAB — LIPID PANEL
Chol/HDL Ratio: 2.6 ratio (ref 0.0–5.0)
Cholesterol, Total: 124 mg/dL (ref 100–199)
HDL: 47 mg/dL (ref >39)
LDL Chol Calc (NIH): 55 mg/dL (ref 0–99)
Triglycerides: 121 mg/dL (ref 0–149)
VLDL Cholesterol Cal: 22 mg/dL (ref 5–40)

## 2019-05-08 LAB — SPECIMEN STATUS REPORT

## 2019-05-10 ENCOUNTER — Other Ambulatory Visit: Payer: Self-pay | Admitting: Family Medicine

## 2019-05-11 ENCOUNTER — Other Ambulatory Visit: Payer: Self-pay | Admitting: Family Medicine

## 2019-05-12 ENCOUNTER — Ambulatory Visit: Payer: Medicare HMO | Admitting: Family Medicine

## 2019-05-19 ENCOUNTER — Telehealth: Payer: Self-pay | Admitting: Family Medicine

## 2019-05-20 ENCOUNTER — Other Ambulatory Visit: Payer: Self-pay | Admitting: Family Medicine

## 2019-05-20 DIAGNOSIS — R918 Other nonspecific abnormal finding of lung field: Secondary | ICD-10-CM

## 2019-05-20 MED ORDER — SILDENAFIL CITRATE 20 MG PO TABS: ORAL_TABLET | ORAL | 5 refills | Status: DC

## 2019-05-20 NOTE — Telephone Encounter (Signed)
Aware.  Script was sent in.

## 2019-05-20 NOTE — Telephone Encounter (Signed)
I sent in the requested prescription 

## 2019-05-22 ENCOUNTER — Other Ambulatory Visit: Payer: Self-pay | Admitting: Family Medicine

## 2019-05-22 ENCOUNTER — Encounter (HOSPITAL_COMMUNITY): Payer: Self-pay | Admitting: *Deleted

## 2019-05-22 DIAGNOSIS — R918 Other nonspecific abnormal finding of lung field: Secondary | ICD-10-CM

## 2019-05-22 NOTE — Progress Notes (Addendum)
Received referral for initial lung cancer screening scan from Dr. Livia Snellen at [facility] patient and obtained smoking history.  (He started smoking at age 59.  For the last year he has smoked 0.5 ppd, 10 years prior to that he smoked 1 ppd, for 10 years before that he smoked 2 ppd, and for the 16 years before that he smoked 1 ppd.  This makes his smoking history 46.5 pack year).  He was also able to answer questions related to the screening process.  Patient denies any new cough or hemoptysis.  He does admit to approximately a 10 lb weight loss over the last year.  He recently had a chest xray that showed   Impression:  1. COPD 2. Questionable nodular density left upper lung. Nonenhanced chest CT suggested to further evaluate.    Per Harriet Pho, NP this would disqualify him for initial enrollment into the Lung Screening Program.  She is recommending the patient have follow up CT chest with and without contrast to evaluate this lung mass.  If the CT is positive, Dr. Livia Snellen can then refer patient to the medical oncology clinic here.  If it is a negative scan, then the patient could be followed yearly with a LDCT.    I have advised the referring doctors office.

## 2019-05-23 ENCOUNTER — Ambulatory Visit (HOSPITAL_COMMUNITY)
Admission: RE | Admit: 2019-05-23 | Discharge: 2019-05-23 | Disposition: A | Discharge status: Home / Self Care | Payer: Medicare HMO | Source: Ambulatory Visit | Attending: Family Medicine | Admitting: Family Medicine

## 2019-05-23 ENCOUNTER — Other Ambulatory Visit: Payer: Self-pay

## 2019-05-23 ENCOUNTER — Other Ambulatory Visit: Payer: Self-pay | Admitting: Family Medicine

## 2019-05-23 DIAGNOSIS — R918 Other nonspecific abnormal finding of lung field: Secondary | ICD-10-CM

## 2019-05-23 DIAGNOSIS — R911 Solitary pulmonary nodule: Secondary | ICD-10-CM | POA: Diagnosis not present

## 2019-05-23 NOTE — Progress Notes (Signed)
Yes I Spoke with April from [facility] and who clarified with CT Manager Anderson Malta & a CT without is what they asked to be placed! Thanks :)

## 2019-05-23 NOTE — Progress Notes (Signed)
I have placed a new order as per radiology that they wanted 1 without contrast, I do still think the with contrast would be best for visualizing the tumor but I could not find the one that they wanted me to order and they said to order it without contrast? Caryl Pina, MD [place] 05/23/2019, 10:59 AM

## 2019-05-26 ENCOUNTER — Encounter (HOSPITAL_COMMUNITY): Payer: Self-pay | Admitting: *Deleted

## 2019-05-26 NOTE — Progress Notes (Signed)
Patient had his CT chest w/o contrast ordered by his primary care physician. Patient did not show any signs of lung neoplasm.  Patient will be followed yearly with LDCT screening exams.  Patient was notified of his CT results and that I would be in touch with him next year to schedule his scans.  He verbalizes appreciation and understanding.

## 2019-07-18 ENCOUNTER — Other Ambulatory Visit: Payer: Self-pay | Admitting: Family Medicine

## 2019-07-21 ENCOUNTER — Ambulatory Visit: Payer: Medicare HMO | Admitting: *Deleted

## 2019-07-21 NOTE — Chronic Care Management (AMB) (Signed)
  Chronic Care Management   Outreach Note  07/21/2019 Name: Spencer Stanley MRN: [id number] DOB: [birth year]  Referred by: Claretta Fraise, MD Reason for referral : Chronic Care Management (RN follow up)   An unsuccessful telephone follow-up was attempted today. The patient was referred to the case management team for assistance with care management and care coordination.   Follow Up Plan: A HIPPA compliant phone message was left for the patient providing contact information and requesting a return call.  The care management team will reach out to the patient again over the next 30 days.   Chong Sicilian, BSN, RN-BC Embedded Chronic Care Manager [facility] [place] [facility] Direct Dial: [phone]

## 2019-08-01 ENCOUNTER — Ambulatory Visit: Payer: Medicare HMO | Attending: Internal Medicine

## 2019-08-01 DIAGNOSIS — Z23 Encounter for immunization: Secondary | ICD-10-CM

## 2019-08-01 NOTE — Progress Notes (Signed)
   Covid-19 Vaccination Clinic  Name:  Spencer Stanley    MRN: [id number] DOB: 1960-12-27  08/01/2019  Spencer Stanley was observed post Covid-19 immunization for 15 minutes without incident. He was provided with Vaccine Information Sheet and instruction to access the V-Safe system.   Spencer Stanley was instructed to call 911 with any severe reactions post vaccine: Marland Kitchen Difficulty breathing  . Swelling of face and throat  . A fast heartbeat  . A bad rash all over body  . Dizziness and weakness   Immunizations Administered    Name Date Dose VIS Date Route   Pfizer COVID-19 Vaccine 08/01/2019 12:44 PM 0.3 mL 04/25/2019 Intramuscular   Manufacturer: [place]   Lot: [id number]   [place]: [id number]

## 2019-08-26 ENCOUNTER — Ambulatory Visit: Payer: Medicare HMO | Attending: Internal Medicine

## 2019-08-26 DIAGNOSIS — Z23 Encounter for immunization: Secondary | ICD-10-CM

## 2019-08-26 NOTE — Progress Notes (Signed)
   Covid-19 Vaccination Clinic  Name:  Spencer Stanley    MRN: [id number] DOB: 1960-10-28  08/26/2019  Mr. Spencer Stanley was observed post Covid-19 immunization for 15 minutes without incident. He was provided with Vaccine Information Sheet and instruction to access the V-Safe system.   Mr. Spencer Stanley was instructed to call 911 with any severe reactions post vaccine: Marland Kitchen Difficulty breathing  . Swelling of face and throat  . A fast heartbeat  . A bad rash all over body  . Dizziness and weakness   Immunizations Administered    Name Date Dose VIS Date Route   Pfizer COVID-19 Vaccine 08/26/2019  3:48 PM 0.3 mL 04/25/2019 Intramuscular   Manufacturer: [place]   Lot: [id number]   [place]: [id number]

## 2019-10-02 ENCOUNTER — Other Ambulatory Visit: Payer: Self-pay | Admitting: Family Medicine

## 2019-10-03 ENCOUNTER — Other Ambulatory Visit: Payer: Self-pay | Admitting: Family Medicine

## 2019-10-27 ENCOUNTER — Other Ambulatory Visit: Payer: Self-pay | Admitting: Family Medicine

## 2019-11-03 ENCOUNTER — Ambulatory Visit (INDEPENDENT_AMBULATORY_CARE_PROVIDER_SITE_OTHER): Payer: Medicare HMO | Admitting: Family Medicine

## 2019-11-03 ENCOUNTER — Other Ambulatory Visit: Payer: Self-pay

## 2019-11-03 ENCOUNTER — Encounter: Payer: Self-pay | Admitting: Family Medicine

## 2019-11-03 DIAGNOSIS — Z114 Encounter for screening for human immunodeficiency virus [HIV]: Secondary | ICD-10-CM

## 2019-11-03 DIAGNOSIS — Z23 Encounter for immunization: Secondary | ICD-10-CM | POA: Diagnosis not present

## 2019-11-03 DIAGNOSIS — E119 Type 2 diabetes mellitus without complications: Secondary | ICD-10-CM

## 2019-11-03 DIAGNOSIS — E782 Mixed hyperlipidemia: Secondary | ICD-10-CM | POA: Diagnosis not present

## 2019-11-03 DIAGNOSIS — H6121 Impacted cerumen, right ear: Secondary | ICD-10-CM

## 2019-11-03 DIAGNOSIS — H9201 Otalgia, right ear: Secondary | ICD-10-CM

## 2019-11-03 DIAGNOSIS — R310 Gross hematuria: Secondary | ICD-10-CM | POA: Diagnosis not present

## 2019-11-03 DIAGNOSIS — Z1159 Encounter for screening for other viral diseases: Secondary | ICD-10-CM | POA: Diagnosis not present

## 2019-11-03 LAB — BAYER DCA HB A1C WAIVED: HB A1C (BAYER DCA - WAIVED): 5.5 % (ref <7.0)

## 2019-11-03 NOTE — Progress Notes (Signed)
Subjective:  Patient ID: Spencer Stanley, male    DOB: 12-Jun-1960  Age: 60 y.o. MRN: [id number]  CC: Medical Management of Chronic Issues   HPI Spencer Stanley presents forFollow-up of diabetes. Patient checks blood sugar at home.   15-160 fasting and not checking postprandial Patient denies symptoms such as polyuria, polydipsia, excessive hunger, nausea No significant hypoglycemic spells noted. Medications reviewed. Pt reports taking them regularly without complication/adverse reaction being reported today.    Patient in for follow-up of GERD. Currently asymptomatic taking  PPI daily. There is no chest pain or heartburn. No hematemesis and no melena. No dysphagia or choking. Onset is remote. Progression is stable. Complicating factors, none.  Patient's report that he has been having off and on pain in the right ear.  It is sharp it lasts 5 minutes and recurs frequently through the day it has lasted 2 days recently and he has been having episodes for many years.  It just seems to be getting worse as far as the lymph of the treatment and the severity of the pain.  He also notes that he has some hearing loss on the right as well.  Patient also has occasional hematuria.  He had it twice about 2 weeks ago.  It occurred a few other times over the last year or so as well.  He is concerned that he has occasional back pain and wonders if this is related.  Patient would like to have his pneumonia shot and his Shingrix today if possible. History Spencer Stanley has a past medical history of Anxiety, Cirrhosis (Spencer Stanley), Esophageal varices (Spencer Stanley), Lyme disease, and [place] spotted fever.   He has a past surgical history that includes Hernia repair and Esophageal varice ligation.   His family history includes Aneurysm in his mother; Cancer in his father and mother; Dementia in his father; Diabetes in his sister; Heart failure in his father.He reports that he has been smoking. He has a 16.50 pack-year smoking history.  He quit smokeless tobacco use about 40 years ago.  His smokeless tobacco use included chew. He reports that he does not drink alcohol and does not use drugs.  Current Outpatient Medications on File Prior to Visit  Medication Sig Dispense Refill  . Accu-Chek Softclix Lancets lancets USE TO CHECK BLOOD SUGARS THREE TIMES DAILY 300 each 3  . albuterol (PROVENTIL HFA;VENTOLIN HFA) 108 (90 Base) MCG/ACT inhaler Inhale 2 puffs into the lungs every 6 (six) hours as needed for wheezing or shortness of breath. 54 g 3  . Alcohol Swabs (B-D SINGLE USE SWABS REGULAR) PADS Use to check blood sugars three times daily Dx E11.9 300 each 3  . Blood Glucose Calibration (ACCU-CHEK AVIVA) SOLN Use with Accu-Chek meter 1 each 0  . CALCIUM-MAGNESIUM-ZINC PO Take 1 capsule by mouth 2 (two) times daily.    Marland Kitchen FLUoxetine (Spencer Stanley) 20 MG capsule TAKE 1 CAPSULE EVERY DAY 90 capsule 0  . glucose blood (ACCU-CHEK AVIVA PLUS) test strip Check BS TID Dx E11.9 300 strip 3  . lactulose (CHRONULAC) 10 GM/15ML solution Take 45 g by mouth 3 (three) times daily.    . metFORMIN (GLUCOPHAGE-XR) 750 MG 24 hr tablet TAKE 1 TABLET EVERY DAY WITH BREAKFAST 90 tablet 0  . minocycline (MINOCIN) 100 MG capsule TAKE 1 CAPSULE TWICE DAILY 180 capsule 0  . Multiple Vitamins-Minerals (ZINC PO) Take by mouth.    . multivitamin (ONE-A-DAY MEN'S) TABS tablet Take 1 tablet by mouth daily.    . pantoprazole (PROTONIX) 40  MG tablet TAKE 1 TABLET EVERY DAY 90 tablet 0  . rifaximin (XIFAXAN) 550 MG TABS tablet Take 1 tablet (550 mg total) by mouth 2 (two) times daily. 60 tablet 5  . spironolactone (ALDACTONE) 25 MG tablet TAKE 1 TABLET EVERY DAY 90 tablet 0  . atorvastatin (LIPITOR) 10 MG tablet Take 1 tablet (10 mg total) by mouth daily. (Patient not taking: Reported on 11/03/2019) 90 tablet 1  . sildenafil (REVATIO) 20 MG tablet Take 2-5 pills at once, orally, with each sexual encounter (Patient not taking: Reported on 11/03/2019) 50 tablet 5   No  current facility-administered medications on file prior to visit.    ROS Review of Systems  Constitutional: Negative.   HENT: Positive for ear pain, hearing loss and tinnitus.   Eyes: Negative for visual disturbance.  Respiratory: Negative for cough and shortness of breath.   Cardiovascular: Negative for chest pain and leg swelling.  Gastrointestinal: Negative for abdominal pain, diarrhea, nausea and vomiting.  Genitourinary: Positive for hematuria. Negative for difficulty urinating.  Musculoskeletal: Positive for back pain. Negative for arthralgias and myalgias.  Skin: Negative for rash.  Neurological: Negative for headaches.  Psychiatric/Behavioral: Negative for sleep disturbance.    Objective:  There were no vitals taken for this visit.  BP Readings from Last 3 Encounters:  05/05/19 (!) 72/47  04/15/18 (!) 96/54  03/21/18 (!) 85/56    Wt Readings from Last 3 Encounters:  05/05/19 167 lb (75.8 kg)  04/15/18 179 lb (81.2 kg)  03/21/18 177 lb (80.3 kg)     Physical Exam Constitutional:      General: He is not in acute distress.    Appearance: He is well-developed.  HENT:     Head: Normocephalic and atraumatic.     Right Ear: External ear normal. There is impacted cerumen.     Left Ear: External ear normal.     Nose: Nose normal.  Eyes:     Conjunctiva/sclera: Conjunctivae normal.     Pupils: Pupils are equal, round, and reactive to light.  Cardiovascular:     Rate and Rhythm: Normal rate and regular rhythm.     Heart sounds: Normal heart sounds. No murmur heard.   Pulmonary:     Effort: Pulmonary effort is normal. No respiratory distress.     Breath sounds: Normal breath sounds. No wheezing or rales.  Abdominal:     Palpations: Abdomen is soft.     Tenderness: There is no abdominal tenderness.  Musculoskeletal:        General: Normal range of motion.     Cervical back: Normal range of motion and neck supple.  Skin:    General: Skin is warm and dry.   Neurological:     Mental Status: He is alert and oriented to person, place, and time.     Deep Tendon Reflexes: Reflexes are normal and symmetric.  Psychiatric:        Behavior: Behavior normal.        Thought Content: Thought content normal.        Judgment: Judgment normal.       Assessment & Plan:   Spencer Stanley was seen today for medical management of chronic issues.  Diagnoses and all orders for this visit:  Type 2 diabetes mellitus without complication, without long-term current use of insulin (Spencer Stanley) -     Microalbumin / creatinine urine ratio -     Bayer DCA Hb A1c Waived -     CBC with Differential/Platelet -  CMP14+EGFR -     Lipid panel  Mixed hyperlipidemia -     CBC with Differential/Platelet -     CMP14+EGFR -     Lipid panel  Encounter for screening for human immunodeficiency virus (HIV) -     HIV Antibody (routine testing w rflx)  Encounter for hepatitis C screening test for low risk patient -     Hepatitis C antibody  Gross hematuria -     US RENAL; Future  Otalgia of right ear  Impacted cerumen of right ear    Right ear lavage performed  I am having Spencer Stanley maintain his multivitamin, CALCIUM-MAGNESIUM-ZINC PO, rifaximin, Multiple Vitamins-Minerals (ZINC PO), lactulose, albuterol, Accu-Chek Aviva, B-D SINGLE USE SWABS REGULAR, Accu-Chek Softclix Lancets, atorvastatin, sildenafil, minocycline, metFORMIN, spironolactone, pantoprazole, FLUoxetine, and Accu-Chek Aviva Plus.  No orders of the defined types were placed in this encounter.    Follow-up: Return in about 6 months (around 05/04/2020).  Claretta Fraise, M.D.

## 2019-11-03 NOTE — Addendum Note (Signed)
Addended by: Michaela Corner on: 11/03/2019 03:08 PM   Modules accepted: Orders

## 2019-11-03 NOTE — Addendum Note (Signed)
Addended by: Michaela Corner on: 11/03/2019 02:34 PM   Modules accepted: Orders

## 2019-11-04 LAB — CMP14+EGFR
ALT: 20 IU/L (ref 0–44)
AST: 30 IU/L (ref 0–40)
Albumin/Globulin Ratio: 1.4 (ref 1.2–2.2)
Albumin: 3.6 g/dL — ABNORMAL LOW (ref 3.8–4.9)
Alkaline Phosphatase: 138 IU/L — ABNORMAL HIGH (ref 48–121)
BUN/Creatinine Ratio: 10 (ref 9–20)
BUN: 9 mg/dL (ref 6–24)
Bilirubin Total: 1.1 mg/dL (ref 0.0–1.2)
CO2: 21 mmol/L (ref 20–29)
Calcium: 8.7 mg/dL (ref 8.7–10.2)
Chloride: 108 mmol/L — ABNORMAL HIGH (ref 96–106)
Creatinine, Ser: 0.89 mg/dL (ref 0.76–1.27)
GFR calc Af Amer: 108 mL/min/{1.73_m2} (ref >59)
GFR calc non Af Amer: 94 mL/min/{1.73_m2} (ref >59)
Globulin, Total: 2.6 g/dL (ref 1.5–4.5)
Glucose: 78 mg/dL (ref 65–99)
Potassium: 4 mmol/L (ref 3.5–5.2)
Sodium: 142 mmol/L (ref 134–144)
Total Protein: 6.2 g/dL (ref 6.0–8.5)

## 2019-11-04 LAB — CBC WITH DIFFERENTIAL/PLATELET
Basophils Absolute: 0 10*3/uL (ref 0.0–0.2)
Basos: 1 %
EOS (ABSOLUTE): 0.3 10*3/uL (ref 0.0–0.4)
Eos: 6 %
Hematocrit: 39 % (ref 37.5–51.0)
Hemoglobin: 13.1 g/dL (ref 13.0–17.7)
Immature Grans (Abs): 0 10*3/uL (ref 0.0–0.1)
Immature Granulocytes: 0 %
Lymphocytes Absolute: 1.1 10*3/uL (ref 0.7–3.1)
Lymphs: 26 %
MCH: 30.5 pg (ref 26.6–33.0)
MCHC: 33.6 g/dL (ref 31.5–35.7)
MCV: 91 fL (ref 79–97)
Monocytes Absolute: 0.6 10*3/uL (ref 0.1–0.9)
Monocytes: 14 %
Neutrophils Absolute: 2.2 10*3/uL (ref 1.4–7.0)
Neutrophils: 53 %
Platelets: 77 10*3/uL — CL (ref 150–450)
RBC: 4.29 x10E6/uL (ref 4.14–5.80)
RDW: 14.1 % (ref 11.6–15.4)
WBC: 4.2 10*3/uL (ref 3.4–10.8)

## 2019-11-04 LAB — MICROALBUMIN / CREATININE URINE RATIO
Creatinine, Urine: 215.4 mg/dL
Microalb/Creat Ratio: 19 mg/g creat (ref 0–29)
Microalbumin, Urine: 41.7 ug/mL

## 2019-11-04 LAB — LIPID PANEL
Chol/HDL Ratio: 2.6 ratio (ref 0.0–5.0)
Cholesterol, Total: 111 mg/dL (ref 100–199)
HDL: 43 mg/dL (ref >39)
LDL Chol Calc (NIH): 50 mg/dL (ref 0–99)
Triglycerides: 93 mg/dL (ref 0–149)
VLDL Cholesterol Cal: 18 mg/dL (ref 5–40)

## 2019-11-04 LAB — HIV ANTIBODY (ROUTINE TESTING W REFLEX): HIV Screen 4th Generation wRfx: NONREACTIVE

## 2019-11-04 LAB — AMMONIA: Ammonia: 95 ug/dL (ref 40–200)

## 2019-11-04 LAB — HEPATITIS C ANTIBODY: Hep C Virus Ab: <0.1 s/co ratio (ref 0.0–0.9)

## 2019-11-04 NOTE — Progress Notes (Signed)
Hello Spencer Stanley,  Your lab result is normal and/or stable.Some minor variations that are not significant are commonly marked abnormal, but do not represent any medical problem for you.  Best regards, Duncan Alejandro, M.D.

## 2019-11-07 ENCOUNTER — Other Ambulatory Visit: Payer: Self-pay

## 2019-11-07 ENCOUNTER — Ambulatory Visit (HOSPITAL_COMMUNITY)
Admission: RE | Admit: 2019-11-07 | Discharge: 2019-11-07 | Disposition: A | Discharge status: Home / Self Care | Payer: Medicare HMO | Source: Ambulatory Visit | Attending: Family Medicine | Admitting: Family Medicine

## 2019-11-07 DIAGNOSIS — N200 Calculus of kidney: Secondary | ICD-10-CM | POA: Diagnosis not present

## 2019-11-07 DIAGNOSIS — R310 Gross hematuria: Secondary | ICD-10-CM | POA: Insufficient documentation

## 2019-11-14 DIAGNOSIS — E785 Hyperlipidemia, unspecified: Secondary | ICD-10-CM | POA: Diagnosis not present

## 2019-11-14 DIAGNOSIS — R1111 Vomiting without nausea: Secondary | ICD-10-CM | POA: Diagnosis not present

## 2019-11-14 DIAGNOSIS — R609 Edema, unspecified: Secondary | ICD-10-CM | POA: Diagnosis not present

## 2019-11-14 DIAGNOSIS — R161 Splenomegaly, not elsewhere classified: Secondary | ICD-10-CM | POA: Diagnosis not present

## 2019-11-14 DIAGNOSIS — Z79899 Other long term (current) drug therapy: Secondary | ICD-10-CM | POA: Diagnosis not present

## 2019-11-14 DIAGNOSIS — E119 Type 2 diabetes mellitus without complications: Secondary | ICD-10-CM | POA: Diagnosis not present

## 2019-11-14 DIAGNOSIS — K7030 Alcoholic cirrhosis of liver without ascites: Secondary | ICD-10-CM | POA: Diagnosis not present

## 2019-11-14 DIAGNOSIS — K7581 Nonalcoholic steatohepatitis (NASH): Secondary | ICD-10-CM | POA: Diagnosis not present

## 2019-11-14 DIAGNOSIS — K7460 Unspecified cirrhosis of liver: Secondary | ICD-10-CM | POA: Diagnosis not present

## 2019-11-14 DIAGNOSIS — K8020 Calculus of gallbladder without cholecystitis without obstruction: Secondary | ICD-10-CM | POA: Diagnosis not present

## 2019-11-14 DIAGNOSIS — K7290 Hepatic failure, unspecified without coma: Secondary | ICD-10-CM | POA: Diagnosis not present

## 2019-11-14 DIAGNOSIS — R319 Hematuria, unspecified: Secondary | ICD-10-CM | POA: Diagnosis not present

## 2019-11-14 DIAGNOSIS — R634 Abnormal weight loss: Secondary | ICD-10-CM | POA: Diagnosis not present

## 2019-11-16 ENCOUNTER — Other Ambulatory Visit: Payer: Self-pay | Admitting: Family Medicine

## 2019-12-26 DIAGNOSIS — Z20822 Contact with and (suspected) exposure to covid-19: Secondary | ICD-10-CM | POA: Diagnosis not present

## 2020-02-12 ENCOUNTER — Encounter: Payer: Self-pay | Admitting: *Deleted

## 2020-02-27 ENCOUNTER — Other Ambulatory Visit: Payer: Self-pay | Admitting: Family Medicine

## 2020-03-01 MED ORDER — SPIRONOLACTONE 25 MG PO TABS
25.0000 mg | ORAL_TABLET | Freq: Every day | ORAL | 0 refills | Status: DC
Start: 2020-03-01 — End: 2020-04-13

## 2020-03-01 MED ORDER — PANTOPRAZOLE SODIUM 40 MG PO TBEC
40.0000 mg | DELAYED_RELEASE_TABLET | Freq: Every day | ORAL | 0 refills | Status: DC
Start: 2020-03-01 — End: 2020-04-13

## 2020-03-01 MED ORDER — METFORMIN HCL ER 750 MG PO TB24
ORAL_TABLET | ORAL | 0 refills | Status: DC
Start: 2020-03-01 — End: 2020-04-13

## 2020-03-01 MED ORDER — FLUOXETINE HCL 20 MG PO CAPS
20.0000 mg | ORAL_CAPSULE | Freq: Every day | ORAL | 0 refills | Status: DC
Start: 2020-03-01 — End: 2020-04-13

## 2020-03-01 NOTE — Addendum Note (Signed)
Addended by: Antonietta Barcelona D on: 03/01/2020 09:32 AM   Modules accepted: Orders

## 2020-03-01 NOTE — Addendum Note (Signed)
Addended by: Antonietta Barcelona D on: 03/01/2020 09:33 AM   Modules accepted: Orders

## 2020-03-01 NOTE — Telephone Encounter (Signed)
E-prescribe down. resent 

## 2020-04-12 ENCOUNTER — Other Ambulatory Visit: Payer: Self-pay | Admitting: Family Medicine

## 2020-04-26 ENCOUNTER — Other Ambulatory Visit (HOSPITAL_COMMUNITY): Payer: Self-pay

## 2020-04-26 DIAGNOSIS — Z87891 Personal history of nicotine dependence: Secondary | ICD-10-CM

## 2020-04-26 DIAGNOSIS — Z122 Encounter for screening for malignant neoplasm of respiratory organs: Secondary | ICD-10-CM

## 2020-04-26 NOTE — Progress Notes (Signed)
Order placed for LDCT. I have called the patient and left him a VM to call me back to schedule.

## 2020-04-29 ENCOUNTER — Ambulatory Visit (INDEPENDENT_AMBULATORY_CARE_PROVIDER_SITE_OTHER): Payer: Medicare HMO | Admitting: Family Medicine

## 2020-04-29 ENCOUNTER — Encounter: Payer: Self-pay | Admitting: Family Medicine

## 2020-04-29 ENCOUNTER — Other Ambulatory Visit: Payer: Self-pay

## 2020-04-29 VITALS — Temp 98.8°F | Ht 69.0 in | Wt 165.4 lb

## 2020-04-29 DIAGNOSIS — R310 Gross hematuria: Secondary | ICD-10-CM | POA: Diagnosis not present

## 2020-04-29 DIAGNOSIS — E782 Mixed hyperlipidemia: Secondary | ICD-10-CM | POA: Diagnosis not present

## 2020-04-29 DIAGNOSIS — E119 Type 2 diabetes mellitus without complications: Secondary | ICD-10-CM | POA: Diagnosis not present

## 2020-04-29 DIAGNOSIS — K7030 Alcoholic cirrhosis of liver without ascites: Secondary | ICD-10-CM

## 2020-04-29 DIAGNOSIS — Z23 Encounter for immunization: Secondary | ICD-10-CM

## 2020-04-29 DIAGNOSIS — K921 Melena: Secondary | ICD-10-CM | POA: Diagnosis not present

## 2020-04-29 DIAGNOSIS — E559 Vitamin D deficiency, unspecified: Secondary | ICD-10-CM | POA: Diagnosis not present

## 2020-04-29 LAB — URINALYSIS, COMPLETE
Bilirubin, UA: NEGATIVE
Glucose, UA: NEGATIVE
Nitrite, UA: NEGATIVE
Specific Gravity, UA: 1.025 (ref 1.005–1.030)
Urobilinogen, Ur: 4 mg/dL — ABNORMAL HIGH (ref 0.2–1.0)
pH, UA: 6 (ref 5.0–7.5)

## 2020-04-29 LAB — MICROSCOPIC EXAMINATION: RBC, Urine: >30 /hpf — AB (ref 0–2)

## 2020-04-29 LAB — BAYER DCA HB A1C WAIVED: HB A1C (BAYER DCA - WAIVED): 5.8 % (ref <7.0)

## 2020-04-29 NOTE — Progress Notes (Signed)
Subjective:  Patient ID: Spencer Stanley, male    DOB: 1961/03/17  Age: 58 y.o. MRN: [id number]  CC: Diabetes (6 mos) and Hyperlipidemia   HPI Spencer Stanley presents forFollow-up of diabetes. Patient checks blood sugar at home.   120-140 fasting and  postprandial Patient denies symptoms such as polyuria, polydipsia, excessive hunger, nausea No significant hypoglycemic spells noted. Medications reviewed. Pt reports taking them regularly without complication/adverse reaction being reported today.   3 recent episodes of hematuria.  He also had some rectal bleeding that lasted for about 5 days recently. History Spencer Stanley has a past medical history of Anxiety, Cirrhosis ([place]), Esophageal varices ([place]), Lyme disease, and Rocky Mountain spotted fever.   He has a past surgical history that includes Hernia repair and Esophageal varice ligation.   His family history includes Aneurysm in his mother; Cancer in his father and mother; Dementia in his father; Diabetes in his sister; Heart failure in his father.He reports that he has been smoking. He has a 16.50 pack-year smoking history. He quit smokeless tobacco use about 41 years ago.  His smokeless tobacco use included chew. He reports that he does not drink alcohol and does not use drugs.  Current Outpatient Medications on File Prior to Visit  Medication Sig Dispense Refill  . Accu-Chek Softclix Lancets lancets check blood sugars three times daily dx E11.9 300 each 3  . albuterol (PROVENTIL HFA;VENTOLIN HFA) 108 (90 Base) MCG/ACT inhaler Inhale 2 puffs into the lungs every 6 (six) hours as needed for wheezing or shortness of breath. 54 g 3  . Alcohol Swabs (B-D SINGLE USE SWABS REGULAR) PADS CHECK BLOOD SUGARS THREE TIMES DAILY Dx E11.9 300 each 3  . atorvastatin (LIPITOR) 10 MG tablet TAKE 1 TABLET EVERY DAY 90 tablet 0  . Blood Glucose Calibration (ACCU-CHEK AVIVA) SOLN Use with Accu-Chek meter 1 each 0  . CALCIUM-MAGNESIUM-ZINC PO Take 1 capsule by mouth 2  (two) times daily.    Marland Kitchen FLUoxetine (PROZAC) 20 MG capsule TAKE 1 CAPSULE EVERY DAY 90 capsule 0  . glucose blood (ACCU-CHEK AVIVA PLUS) test strip Check BS TID Dx E11.9 300 strip 3  . lactulose (CHRONULAC) 10 GM/15ML solution Take 45 g by mouth 3 (three) times daily.    . metFORMIN (GLUCOPHAGE-XR) 750 MG 24 hr tablet TAKE 1 TABLET EVERY DAY WITH BREAKFAST 90 tablet 0  . minocycline (MINOCIN) 100 MG capsule TAKE 1 CAPSULE TWICE DAILY 180 capsule 0  . Multiple Vitamins-Minerals (ZINC PO) Take by mouth.    . multivitamin (ONE-A-DAY MEN'S) TABS tablet Take 1 tablet by mouth daily.    . pantoprazole (PROTONIX) 40 MG tablet TAKE 1 TABLET EVERY DAY 90 tablet 0  . rifaximin (XIFAXAN) 550 MG TABS tablet Take 1 tablet (550 mg total) by mouth 2 (two) times daily. 60 tablet 5  . sildenafil (REVATIO) 20 MG tablet Take 2-5 pills at once, orally, with each sexual encounter 50 tablet 5  . spironolactone (ALDACTONE) 25 MG tablet TAKE 1 TABLET EVERY DAY 90 tablet 0  . zinc sulfate 220 (50 Zn) MG capsule Take 1 capsule by mouth daily.     No current facility-administered medications on file prior to visit.    ROS Review of Systems  Constitutional: Negative for fever.  Respiratory: Negative for shortness of breath.   Cardiovascular: Negative for chest pain.  Genitourinary: Positive for hematuria. Negative for decreased urine volume, difficulty urinating, dysuria and frequency.  Musculoskeletal: Negative for arthralgias.  Skin: Negative for rash.  Objective:  Temp 98.8 F (37.1 C) (Temporal)   Ht _0  (1.753 m)   Wt 165 lb 6.4 oz (75 kg)   BMI 24.43 kg/m   BP Readings from Last 3 Encounters:  05/05/19 (!) 72/47  04/15/18 (!) 96/54  03/21/18 (!) 85/56    Wt Readings from Last 3 Encounters:  04/29/20 165 lb 6.4 oz (75 kg)  05/05/19 167 lb (75.8 kg)  04/15/18 179 lb (81.2 kg)     Physical Exam Vitals reviewed.  Constitutional:      Appearance: He is well-developed and well-nourished.   HENT:     Head: Normocephalic and atraumatic.     Right Ear: Tympanic membrane and external ear normal. No decreased hearing noted.     Left Ear: Tympanic membrane and external ear normal. No decreased hearing noted.     Mouth/Throat:     Pharynx: No oropharyngeal exudate or posterior oropharyngeal erythema.  Eyes:     Pupils: Pupils are equal, round, and reactive to light.  Cardiovascular:     Rate and Rhythm: Normal rate and regular rhythm.     Heart sounds: No murmur heard.   Pulmonary:     Effort: No respiratory distress.     Breath sounds: Normal breath sounds.  Abdominal:     General: Bowel sounds are normal.     Palpations: Abdomen is soft. There is no mass.     Tenderness: There is no abdominal tenderness.  Musculoskeletal:     Cervical back: Normal range of motion and neck supple.       Assessment & Plan:   Spencer Stanley was seen today for diabetes and hyperlipidemia.  Diagnoses and all orders for this visit:  Type 2 diabetes mellitus without complication, without long-term current use of insulin (HCC) -     CBC with Differential/Platelet -     CMP14+EGFR -     Bayer DCA Hb A1c Waived  Mixed hyperlipidemia -     CBC with Differential/Platelet -     CMP14+EGFR -     Lipid panel -     Lipid panel  Alcoholic cirrhosis of liver without ascites (HCC) -     CBC with Differential/Platelet -     CMP14+EGFR -     Ammonia  Gross hematuria -     CBC with Differential/Platelet -     CMP14+EGFR -     Urinalysis, Complete -     Urine Microscopic  Hematochezia -     CBC with Differential/Platelet -     CMP14+EGFR  Vitamin D deficiency -     CBC with Differential/Platelet -     CMP14+EGFR -     VITAMIN D 25 Hydroxy (Vit-D Deficiency, Fractures)  Need for immunization against influenza -     Flu Vaccine QUAD 36+ mos IM  Other orders -     Microscopic Examination      I am having Spencer Stanley maintain his multivitamin, CALCIUM-MAGNESIUM-ZINC PO, rifaximin,  Multiple Vitamins-Minerals (ZINC PO), lactulose, albuterol, Accu-Chek Aviva, sildenafil, minocycline, Accu-Chek Aviva Plus, Accu-Chek Softclix Lancets, B-D SINGLE USE SWABS REGULAR, atorvastatin, metFORMIN, pantoprazole, spironolactone, FLUoxetine, and zinc sulfate.  No orders of the defined types were placed in this encounter.    Follow-up: Return in about 3 months (around 07/28/2020), or if symptoms worsen or fail to improve.  Claretta Fraise, M.D.

## 2020-04-30 LAB — CMP14+EGFR
ALT: 25 IU/L (ref 0–44)
AST: 35 IU/L (ref 0–40)
Albumin/Globulin Ratio: 1.4 (ref 1.2–2.2)
Albumin: 3.9 g/dL (ref 3.8–4.9)
Alkaline Phosphatase: 132 IU/L — ABNORMAL HIGH (ref 44–121)
BUN/Creatinine Ratio: 13 (ref 10–24)
BUN: 10 mg/dL (ref 8–27)
Bilirubin Total: 1 mg/dL (ref 0.0–1.2)
CO2: 23 mmol/L (ref 20–29)
Calcium: 9.4 mg/dL (ref 8.6–10.2)
Chloride: 107 mmol/L — ABNORMAL HIGH (ref 96–106)
Creatinine, Ser: 0.78 mg/dL (ref 0.76–1.27)
GFR calc Af Amer: 113 mL/min/{1.73_m2} (ref >59)
GFR calc non Af Amer: 98 mL/min/{1.73_m2} (ref >59)
Globulin, Total: 2.7 g/dL (ref 1.5–4.5)
Glucose: 118 mg/dL — ABNORMAL HIGH (ref 65–99)
Potassium: 3.6 mmol/L (ref 3.5–5.2)
Sodium: 142 mmol/L (ref 134–144)
Total Protein: 6.6 g/dL (ref 6.0–8.5)

## 2020-04-30 LAB — CBC WITH DIFFERENTIAL/PLATELET
Basophils Absolute: 0.1 10*3/uL (ref 0.0–0.2)
Basos: 1 %
EOS (ABSOLUTE): 0.2 10*3/uL (ref 0.0–0.4)
Eos: 4 %
Hematocrit: 40.5 % (ref 37.5–51.0)
Hemoglobin: 13.5 g/dL (ref 13.0–17.7)
Immature Grans (Abs): 0 10*3/uL (ref 0.0–0.1)
Immature Granulocytes: 0 %
Lymphocytes Absolute: 1.5 10*3/uL (ref 0.7–3.1)
Lymphs: 28 %
MCH: 29.1 pg (ref 26.6–33.0)
MCHC: 33.3 g/dL (ref 31.5–35.7)
MCV: 87 fL (ref 79–97)
Monocytes Absolute: 0.6 10*3/uL (ref 0.1–0.9)
Monocytes: 12 %
Neutrophils Absolute: 2.9 10*3/uL (ref 1.4–7.0)
Neutrophils: 55 %
Platelets: 108 10*3/uL — ABNORMAL LOW (ref 150–450)
RBC: 4.64 x10E6/uL (ref 4.14–5.80)
RDW: 14.1 % (ref 11.6–15.4)
WBC: 5.2 10*3/uL (ref 3.4–10.8)

## 2020-04-30 LAB — VITAMIN D 25 HYDROXY (VIT D DEFICIENCY, FRACTURES): Vit D, 25-Hydroxy: 58.3 ng/mL (ref 30.0–100.0)

## 2020-04-30 LAB — LIPID PANEL
Chol/HDL Ratio: 2.7 ratio (ref 0.0–5.0)
Cholesterol, Total: 129 mg/dL (ref 100–199)
HDL: 47 mg/dL (ref >39)
LDL Chol Calc (NIH): 67 mg/dL (ref 0–99)
Triglycerides: 74 mg/dL (ref 0–149)
VLDL Cholesterol Cal: 15 mg/dL (ref 5–40)

## 2020-04-30 LAB — AMMONIA: Ammonia: 113 ug/dL (ref 40–200)

## 2020-05-03 ENCOUNTER — Other Ambulatory Visit: Payer: Self-pay | Admitting: Family Medicine

## 2020-05-03 ENCOUNTER — Other Ambulatory Visit: Payer: Medicare HMO

## 2020-05-03 ENCOUNTER — Other Ambulatory Visit: Payer: Self-pay | Admitting: *Deleted

## 2020-05-03 ENCOUNTER — Other Ambulatory Visit: Payer: Self-pay

## 2020-05-03 DIAGNOSIS — R319 Hematuria, unspecified: Secondary | ICD-10-CM | POA: Diagnosis not present

## 2020-05-03 DIAGNOSIS — H53009 Unspecified amblyopia, unspecified eye: Secondary | ICD-10-CM | POA: Diagnosis not present

## 2020-05-03 DIAGNOSIS — E119 Type 2 diabetes mellitus without complications: Secondary | ICD-10-CM | POA: Diagnosis not present

## 2020-05-03 DIAGNOSIS — H269 Unspecified cataract: Secondary | ICD-10-CM | POA: Diagnosis not present

## 2020-05-03 DIAGNOSIS — H524 Presbyopia: Secondary | ICD-10-CM | POA: Diagnosis not present

## 2020-05-03 DIAGNOSIS — H5203 Hypermetropia, bilateral: Secondary | ICD-10-CM | POA: Diagnosis not present

## 2020-05-03 DIAGNOSIS — H52209 Unspecified astigmatism, unspecified eye: Secondary | ICD-10-CM | POA: Diagnosis not present

## 2020-05-03 MED ORDER — AMOXICILLIN-POT CLAVULANATE 875-125 MG PO TABS: 1.0000 | ORAL_TABLET | Freq: Two times a day (BID) | ORAL | 0 refills | Status: DC

## 2020-05-04 ENCOUNTER — Ambulatory Visit: Payer: Medicare HMO | Admitting: Family Medicine

## 2020-05-05 LAB — URINE CULTURE: Organism ID, Bacteria: NO GROWTH

## 2020-05-13 ENCOUNTER — Ambulatory Visit (HOSPITAL_COMMUNITY): Payer: Medicare HMO

## 2020-05-18 ENCOUNTER — Ambulatory Visit (HOSPITAL_COMMUNITY)
Admission: RE | Admit: 2020-05-18 | Discharge: 2020-05-18 | Disposition: A | Discharge status: Home / Self Care | Payer: Medicare HMO | Source: Ambulatory Visit | Attending: Family Medicine | Admitting: Family Medicine

## 2020-05-18 ENCOUNTER — Other Ambulatory Visit: Payer: Self-pay

## 2020-05-18 ENCOUNTER — Other Ambulatory Visit: Payer: Self-pay | Admitting: Family Medicine

## 2020-05-18 DIAGNOSIS — N200 Calculus of kidney: Secondary | ICD-10-CM | POA: Diagnosis not present

## 2020-05-18 DIAGNOSIS — R310 Gross hematuria: Secondary | ICD-10-CM | POA: Insufficient documentation

## 2020-05-18 DIAGNOSIS — R319 Hematuria, unspecified: Secondary | ICD-10-CM | POA: Diagnosis not present

## 2020-05-24 ENCOUNTER — Encounter (HOSPITAL_COMMUNITY): Payer: Self-pay

## 2020-05-24 NOTE — Progress Notes (Signed)
Call placed to patient for follow-up LDCT scheduling. I spoke with Almyra Free, patient's significant other who tells me that she assists the patient in making appts. Appt scheduled for 06/14/2020 at 1600. Almyra Free is aware.

## 2020-06-14 ENCOUNTER — Ambulatory Visit (HOSPITAL_COMMUNITY)
Admission: RE | Admit: 2020-06-14 | Discharge: 2020-06-14 | Disposition: A | Discharge status: Home / Self Care | Payer: Medicare HMO | Source: Ambulatory Visit | Attending: Oncology | Admitting: Oncology

## 2020-06-14 ENCOUNTER — Other Ambulatory Visit: Payer: Self-pay

## 2020-06-14 DIAGNOSIS — Z87891 Personal history of nicotine dependence: Secondary | ICD-10-CM | POA: Diagnosis not present

## 2020-06-14 DIAGNOSIS — Z122 Encounter for screening for malignant neoplasm of respiratory organs: Secondary | ICD-10-CM | POA: Insufficient documentation

## 2020-06-14 DIAGNOSIS — F17210 Nicotine dependence, cigarettes, uncomplicated: Secondary | ICD-10-CM | POA: Diagnosis not present

## 2020-06-15 ENCOUNTER — Encounter (HOSPITAL_COMMUNITY): Payer: Self-pay

## 2020-06-15 NOTE — Progress Notes (Signed)
Patient notified of LDCT Lung Cancer Screening Results via mail with the recommendation to follow-up in 12 months. Patient's referring provider has been sent a copy of results. Results are as follows:   IMPRESSION: Lung-RADS 1, negative. Continue annual screening with low-dose chest CT without contrast in 12 months.   Aortic Atherosclerosis (ICD10-I70.0) and Emphysema (ICD10-J43.9). 

## 2020-06-21 ENCOUNTER — Encounter: Payer: Self-pay | Admitting: Urology

## 2020-06-21 ENCOUNTER — Other Ambulatory Visit: Payer: Self-pay

## 2020-06-21 ENCOUNTER — Ambulatory Visit (INDEPENDENT_AMBULATORY_CARE_PROVIDER_SITE_OTHER): Payer: Medicare HMO | Admitting: Urology

## 2020-06-21 VITALS — BP 128/74 | HR 79 | Temp 98.4°F | Ht 69.0 in | Wt 160.0 lb

## 2020-06-21 DIAGNOSIS — R319 Hematuria, unspecified: Secondary | ICD-10-CM | POA: Diagnosis not present

## 2020-06-21 DIAGNOSIS — R310 Gross hematuria: Secondary | ICD-10-CM

## 2020-06-21 LAB — URINALYSIS, ROUTINE W REFLEX MICROSCOPIC
Bilirubin, UA: NEGATIVE
Nitrite, UA: NEGATIVE
Specific Gravity, UA: 1.02 (ref 1.005–1.030)
Urobilinogen, Ur: >8 mg/dL — ABNORMAL HIGH (ref 0.2–1.0)
pH, UA: 6 (ref 5.0–7.5)

## 2020-06-21 LAB — MICROSCOPIC EXAMINATION: Renal Epithel, UA: NONE SEEN /hpf

## 2020-06-21 LAB — BLADDER SCAN AMB NON-IMAGING: Scan Result: 13

## 2020-06-21 MED ORDER — TAMSULOSIN HCL 0.4 MG PO CAPS: 0.4000 mg | ORAL_CAPSULE | Freq: Every day | ORAL | 11 refills | Status: DC

## 2020-06-21 NOTE — Progress Notes (Signed)
H&P -[facility]  Chief Complaint: Gross hematuria  History of Present Illness: New patient seen for the following -   1) gross hematuria-patient noted red urine Nov and Dec 2021. No clots. UA 12/21 showed greater than 30 red blood cells per high-powered field, urine cx no growth. He underwent an ultrasound 01/22 which was benign, possible 5 mm LLP (echogenic focus no shadowing). He is a smoker.  Cr 0.78.   2) BPH-prostate was 58 g on 01/22 renal/bladder ultrasound with some bladder wall thickening/trabeculation. He has a weak stream, urgency and urge UI. No pad use. PVR is 13 ml.   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 He stopped drinking. 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     Past Medical History:  Diagnosis Date  . Anxiety   . Cirrhosis ([place])   . Esophageal varices ([place])   . Lyme disease   . Rocky Mountain spotted fever    Past Surgical History:  Procedure Laterality Date  . ESOPHAGEAL VARICE LIGATION     TIPS  . HERNIA REPAIR      Home Medications:  (Not in a hospital admission)  Allergies:  Allergies  Allergen Reactions  . Other Other (See Comments)    Other reaction(s): Mental Status Changes (intolerance) After medicated induced coma patient seeing things that weren't there.     Family History  Problem Relation Age of Onset  . Aneurysm Mother   . Cancer Mother        breast  . Dementia Father   . Cancer Father        brain tumor  . Heart failure Father   . Diabetes Sister    Social History:  reports that he has been smoking. He has a 16.50 pack-year smoking history. He quit smokeless tobacco use about 41 years ago.  His smokeless tobacco use included chew. He reports that he does not drink alcohol and does not use drugs.  ROS: A complete review of systems was performed.  All systems are negative except for pertinent findings as noted. ROS   Physical Exam:  Vital signs in last 24 hours: @VSRANGES @ General:  Alert and oriented, No acute distress HEENT:  Normocephalic, atraumatic Cardiovascular: Regular rate and rhythm Lungs: Regular rate and effort Abdomen: Soft, nontender, nondistended, no abdominal masses Back: No CVA tenderness Extremities: No edema Neurologic: Grossly intact GU: DRE 60 g and benign, no hard area or nodule   Laboratory Data:  No results found for this or any previous visit (from the past 24 hour(s)). No results found for this or any previous visit (from the past 240 hour(s)). Creatinine: No results for input(s): CREATININE in the last 168 hours.  Impression/Assessment/plan:  1) gross hematuria - given his age and smoking will set up for CT scan. Also discussed importance of cystoscopy.    2) BPH - discussed nature r/b/a to tamsulosin and he will start.    Festus Aloe 06/21/2020, 9:05 AM

## 2020-06-21 NOTE — Patient Instructions (Signed)
Cystoscopy Cystoscopy is a procedure that is used to help diagnose and sometimes treat conditions that affect the lower urinary tract. The lower urinary tract includes the bladder and the urethra. The urethra is the tube that drains urine from the bladder. Cystoscopy is done using a thin, tube-shaped instrument with a light and camera at the end (cystoscope). The cystoscope may be hard or flexible, depending on the goal of the procedure. The cystoscope is inserted through the urethra, into the bladder. Cystoscopy may be recommended if you have:  Urinary tract infections that keep coming back.  Blood in the urine (hematuria).  An inability to control when you urinate (urinary incontinence) or an overactive bladder.  Unusual cells found in a urine sample.  A blockage in the urethra, such as a urinary stone.  Painful urination.  An abnormality in the bladder found during an intravenous pyelogram (IVP) or CT scan. Cystoscopy may also be done to remove a sample of tissue to be examined under a microscope (biopsy). Tell a health care provider about:  Any allergies you have.  All medicines you are taking, including vitamins, herbs, eye drops, creams, and over-the-counter medicines.  Any problems you or family members have had with anesthetic medicines.  Any blood disorders you have.  Any surgeries you have had.  Any medical conditions you have.  Whether you are pregnant or may be pregnant. What are the risks? Generally, this is a safe procedure. However, problems may occur, including:  Infection.  Bleeding.  Allergic reactions to medicines.  Damage to other structures or organs. What happens before the procedure? Medicines Ask your health care provider about:  Changing or stopping your regular medicines. This is especially important if you are taking diabetes medicines or blood thinners.  Taking medicines such as aspirin and ibuprofen. These medicines can thin your blood. Do  not take these medicines unless your health care provider tells you to take them.  Taking over-the-counter medicines, vitamins, herbs, and supplements. Tests You may have an exam or testing, such as:  X-rays of the bladder, urethra, or kidneys.  CT scan of the abdomen or pelvis.  Urine tests to check for signs of infection. General instructions  Follow instructions from your health care provider about eating or drinking restrictions.  Ask your health care provider what steps will be taken to help prevent infection. These steps may include: ? Washing skin with a germ-killing soap. ? Taking antibiotic medicine.  Plan to have a responsible adult take you home from the hospital or clinic. What happens during the procedure?  You will be given one or more of the following: ? A medicine to help you relax (sedative). ? A medicine to numb the area (local anesthetic).  The area around the opening of your urethra will be cleaned.  The cystoscope will be passed through your urethra into your bladder.  Germ-free (sterile) fluid will flow through the cystoscope to fill your bladder. The fluid will stretch your bladder so that your health care provider can clearly examine your bladder walls.  Your doctor will look at the urethra and bladder. Your doctor may take a biopsy or remove stones.  The cystoscope will be removed, and your bladder will be emptied. The procedure may vary among health care providers and hospitals.   What can I expect after the procedure? After the procedure, it is common to have:  Some soreness or pain in your abdomen and urethra.  Urinary symptoms. These include: ? Mild pain or burning  when you urinate. Pain should stop within a few minutes after you urinate. This may last for up to 1 week. ? A small amount of blood in your urine for several days. ? Feeling like you need to urinate but producing only a small amount of urine. Follow these instructions at  home: Medicines  Take over-the-counter and prescription medicines only as told by your health care provider.  If you were prescribed an antibiotic medicine, take it as told by your health care provider. Do not stop taking the antibiotic even if you start to feel better. General instructions  Return to your normal activities as told by your health care provider. Ask your health care provider what activities are safe for you.  If you were given a sedative during the procedure, it can affect you for several hours. Do not drive or operate machinery until your health care provider says that it is safe.  Watch for any blood in your urine. If the amount of blood in your urine increases, call your health care provider.  Follow instructions from your health care provider about eating or drinking restrictions.  If a tissue sample was removed for testing (biopsy) during your procedure, it is up to you to get your test results. Ask your health care provider, or the department that is doing the test, when your results will be ready.  Drink enough fluid to keep your urine pale yellow.  Keep all follow-up visits. This is important. Contact a health care provider if:  You have pain that gets worse or does not get better with medicine, especially pain when you urinate.  You have trouble urinating.  You have more blood in your urine. Get help right away if:  You have blood clots in your urine.  You have abdominal pain.  You have a fever or chills.  You are unable to urinate. Summary  Cystoscopy is a procedure that is used to help diagnose and sometimes treat conditions that affect the lower urinary tract.  Cystoscopy is done using a thin, tube-shaped instrument with a light and camera at the end.  After the procedure, it is common to have some soreness or pain in your abdomen and urethra.  Watch for any blood in your urine. If the amount of blood in your urine increases, call your health  care provider.  If you were prescribed an antibiotic medicine, take it as told by your health care provider. Do not stop taking the antibiotic even if you start to feel better. This information is not intended to replace advice given to you by your health care provider. Make sure you discuss any questions you have with your health care provider. Document Revised: 12/12/2019 Document Reviewed: 12/12/2019 Elsevier Patient Education  [place]. Benign Prostatic Hyperplasia  Benign prostatic hyperplasia (BPH) is an enlarged prostate gland that is caused by the normal aging process and not by cancer. The prostate is a walnut-sized gland that is involved in the production of semen. It is located in front of the rectum and below the bladder. The bladder stores urine and the urethra is the tube that carries the urine out of the body. The prostate may get bigger as a man gets older. An enlarged prostate can press on the urethra. This can make it harder to pass urine. The build-up of urine in the bladder can cause infection. Back pressure and infection may progress to bladder damage and kidney (renal) failure. What are the causes? This condition is part  of a normal aging process. However, not all men develop problems from this condition. If the prostate enlarges away from the urethra, urine flow will not be blocked. If it enlarges toward the urethra and compresses it, there will be problems passing urine. What increases the risk? This condition is more likely to develop in men over the age of 68 years. What are the signs or symptoms? Symptoms of this condition include:  Getting up often during the night to urinate.  Needing to urinate frequently during the day.  Difficulty starting urine flow.  Decrease in size and strength of your urine stream.  Leaking (dribbling) after urinating.  Inability to pass urine. This needs immediate treatment.  Inability to completely empty your  bladder.  Pain when you pass urine. This is more common if there is also an infection.  Urinary tract infection (UTI). How is this diagnosed? This condition is diagnosed based on your medical history, a physical exam, and your symptoms. Tests will also be done, such as:  A post-void bladder scan. This measures any amount of urine that may remain in your bladder after you finish urinating.  A digital rectal exam. In a rectal exam, your health care provider checks your prostate by putting a lubricated, gloved finger into your rectum to feel the back of your prostate gland. This exam detects the size of your gland and any abnormal lumps or growths.  An exam of your urine (urinalysis).  A prostate specific antigen (PSA) screening. This is a blood test used to screen for prostate cancer.  An ultrasound. This test uses sound waves to electronically produce a picture of your prostate gland. Your health care provider may refer you to a specialist in kidney and prostate diseases (urologist). How is this treated? Once symptoms begin, your health care provider will monitor your condition (active surveillance or watchful waiting). Treatment for this condition will depend on the severity of your condition. Treatment may include:  Observation and yearly exams. This may be the only treatment needed if your condition and symptoms are mild.  Medicines to relieve your symptoms, including: ? Medicines to shrink the prostate. ? Medicines to relax the muscle of the prostate.  Surgery in severe cases. Surgery may include: ? Prostatectomy. In this procedure, the prostate tissue is removed completely through an open incision or with a laparoscope or robotics. ? Transurethral resection of the prostate (TURP). In this procedure, a tool is inserted through the opening at the tip of the penis (urethra). It is used to cut away tissue of the inner core of the prostate. The pieces are removed through the same opening  of the penis. This removes the blockage. ? Transurethral incision (TUIP). In this procedure, small cuts are made in the prostate. This lessens the prostate's pressure on the urethra. ? Transurethral microwave thermotherapy (TUMT). This procedure uses microwaves to create heat. The heat destroys and removes a small amount of prostate tissue. ? Transurethral needle ablation (TUNA). This procedure uses radio frequencies to destroy and remove a small amount of prostate tissue. ? Interstitial laser coagulation (Struthers). This procedure uses a laser to destroy and remove a small amount of prostate tissue. ? Transurethral electrovaporization (TUVP). This procedure uses electrodes to destroy and remove a small amount of prostate tissue. ? Prostatic urethral lift. This procedure inserts an implant to push the lobes of the prostate away from the urethra. Follow these instructions at home:  Take over-the-counter and prescription medicines only as told by your health care  provider.  Monitor your symptoms for any changes. Contact your health care provider with any changes.  Avoid drinking large amounts of liquid before going to bed or out in public.  Avoid or reduce how much caffeine or alcohol you drink.  Give yourself time when you urinate.  Keep all follow-up visits as told by your health care provider. This is important. Contact a health care provider if:  You have unexplained back pain.  Your symptoms do not get better with treatment.  You develop side effects from the medicine you are taking.  Your urine becomes very dark or has a bad smell.  Your lower abdomen becomes distended and you have trouble passing your urine. Get help right away if:  You have a fever or chills.  You suddenly cannot urinate.  You feel lightheaded, or very dizzy, or you faint.  There are large amounts of blood or clots in the urine.  Your urinary problems become hard to manage.  You develop moderate to severe  low back or flank pain. The flank is the side of your body between the ribs and the hip. These symptoms may represent a serious problem that is an emergency. Do not wait to see if the symptoms will go away. Get medical help right away. Call your local emergency services (911 in the [place] Do not drive yourself to the hospital. Summary  Benign prostatic hyperplasia (BPH) is an enlarged prostate that is caused by the normal aging process and not by cancer.  An enlarged prostate can press on the urethra. This can make it hard to pass urine.  This condition is part of a normal aging process and is more likely to develop in men over the age of 49 years.  Get help right away if you suddenly cannot urinate. This information is not intended to replace advice given to you by your health care provider. Make sure you discuss any questions you have with your health care provider. Document Revised: 01/08/2020 Document Reviewed: 01/08/2020 Elsevier Patient Education  [place].

## 2020-06-21 NOTE — Progress Notes (Signed)
Bladder Scan Patient can void: 13 ml Performed By: Durenda Guthrie, lpn  Urological Symptom Review  Patient is experiencing the following symptoms: Frequent urination Hard to postpone urination Get up at night to urinate Stream starts and stops Trouble starting stream Have to strain to urinate Blood in urine Urinary tract infection Weak stream Erection problems (male only)  Kidney stones   Review of Systems  Gastrointestinal (upper)  : Negative for upper GI symptoms  Gastrointestinal (lower) : Negative for lower GI symptoms  Constitutional : Weight loss Fatigue  Skin: Negative for skin symptoms  Eyes: Negative for eye symptoms  Ear/Nose/Throat : Negative for Ear/Nose/Throat symptoms  Hematologic/Lymphatic: Negative for Hematologic/Lymphatic symptoms  Cardiovascular : Negative for cardiovascular symptoms  Respiratory : Negative for respiratory symptoms  Endocrine: Excessive thirst  Musculoskeletal: Back pain  Neurological: Negative for neurological symptoms  Psychologic: Depression Anxiety

## 2020-06-25 DIAGNOSIS — K7290 Hepatic failure, unspecified without coma: Secondary | ICD-10-CM | POA: Diagnosis not present

## 2020-06-25 DIAGNOSIS — R319 Hematuria, unspecified: Secondary | ICD-10-CM | POA: Diagnosis not present

## 2020-06-25 DIAGNOSIS — Z8719 Personal history of other diseases of the digestive system: Secondary | ICD-10-CM | POA: Diagnosis not present

## 2020-06-25 DIAGNOSIS — E119 Type 2 diabetes mellitus without complications: Secondary | ICD-10-CM | POA: Diagnosis not present

## 2020-06-25 DIAGNOSIS — E785 Hyperlipidemia, unspecified: Secondary | ICD-10-CM | POA: Diagnosis not present

## 2020-06-25 DIAGNOSIS — K921 Melena: Secondary | ICD-10-CM | POA: Diagnosis not present

## 2020-06-25 DIAGNOSIS — Z720 Tobacco use: Secondary | ICD-10-CM | POA: Diagnosis not present

## 2020-06-25 DIAGNOSIS — K7030 Alcoholic cirrhosis of liver without ascites: Secondary | ICD-10-CM | POA: Diagnosis not present

## 2020-06-25 DIAGNOSIS — R531 Weakness: Secondary | ICD-10-CM | POA: Diagnosis not present

## 2020-06-27 DIAGNOSIS — K921 Melena: Secondary | ICD-10-CM | POA: Insufficient documentation

## 2020-06-28 ENCOUNTER — Other Ambulatory Visit: Payer: Self-pay | Admitting: Family Medicine

## 2020-06-29 DIAGNOSIS — K7581 Nonalcoholic steatohepatitis (NASH): Secondary | ICD-10-CM | POA: Diagnosis not present

## 2020-06-29 DIAGNOSIS — K7460 Unspecified cirrhosis of liver: Secondary | ICD-10-CM | POA: Diagnosis not present

## 2020-07-01 DIAGNOSIS — K7581 Nonalcoholic steatohepatitis (NASH): Secondary | ICD-10-CM | POA: Diagnosis not present

## 2020-07-01 DIAGNOSIS — K7460 Unspecified cirrhosis of liver: Secondary | ICD-10-CM | POA: Diagnosis not present

## 2020-07-16 ENCOUNTER — Other Ambulatory Visit (HOSPITAL_COMMUNITY): Payer: Medicare HMO

## 2020-07-16 ENCOUNTER — Other Ambulatory Visit: Payer: Self-pay

## 2020-07-16 ENCOUNTER — Ambulatory Visit (HOSPITAL_COMMUNITY)
Admission: RE | Admit: 2020-07-16 | Discharge: 2020-07-16 | Disposition: A | Discharge status: Home / Self Care | Payer: Medicare HMO | Source: Ambulatory Visit | Attending: Urology | Admitting: Urology

## 2020-07-16 DIAGNOSIS — R310 Gross hematuria: Secondary | ICD-10-CM | POA: Insufficient documentation

## 2020-07-16 DIAGNOSIS — N201 Calculus of ureter: Secondary | ICD-10-CM | POA: Diagnosis not present

## 2020-07-16 DIAGNOSIS — R319 Hematuria, unspecified: Secondary | ICD-10-CM | POA: Diagnosis not present

## 2020-07-16 DIAGNOSIS — K8020 Calculus of gallbladder without cholecystitis without obstruction: Secondary | ICD-10-CM | POA: Diagnosis not present

## 2020-07-16 DIAGNOSIS — K7460 Unspecified cirrhosis of liver: Secondary | ICD-10-CM | POA: Diagnosis not present

## 2020-07-16 LAB — POCT I-STAT CREATININE: Creatinine, Ser: 0.8 mg/dL (ref 0.61–1.24)

## 2020-07-16 MED ORDER — SODIUM CHLORIDE 0.9 % IV SOLN
INTRAVENOUS | Status: AC
  Filled 2020-07-16: qty 250

## 2020-07-16 MED ORDER — IOHEXOL 300 MG/ML  SOLN
125.0000 mL | Freq: Once | INTRAMUSCULAR | Status: AC | PRN
  Administered 2020-07-16: 125 mL via INTRAVENOUS

## 2020-07-19 ENCOUNTER — Ambulatory Visit (INDEPENDENT_AMBULATORY_CARE_PROVIDER_SITE_OTHER): Payer: Medicare HMO | Admitting: Urology

## 2020-07-19 ENCOUNTER — Encounter: Payer: Self-pay | Admitting: Urology

## 2020-07-19 ENCOUNTER — Other Ambulatory Visit: Payer: Self-pay

## 2020-07-19 ENCOUNTER — Ambulatory Visit: Payer: Medicare HMO | Admitting: Urology

## 2020-07-19 VITALS — BP 105/67 | HR 80 | Temp 97.9°F

## 2020-07-19 DIAGNOSIS — N201 Calculus of ureter: Secondary | ICD-10-CM | POA: Diagnosis not present

## 2020-07-19 DIAGNOSIS — R310 Gross hematuria: Secondary | ICD-10-CM | POA: Diagnosis not present

## 2020-07-19 LAB — URINALYSIS, ROUTINE W REFLEX MICROSCOPIC
Bilirubin, UA: NEGATIVE
Glucose, UA: NEGATIVE
Ketones, UA: NEGATIVE
Nitrite, UA: NEGATIVE
Specific Gravity, UA: 1.015 (ref 1.005–1.030)
Urobilinogen, Ur: >8 mg/dL — ABNORMAL HIGH (ref 0.2–1.0)
pH, UA: 6.5 (ref 5.0–7.5)

## 2020-07-19 LAB — MICROSCOPIC EXAMINATION
Bacteria, UA: NONE SEEN
RBC, Urine: >30 /hpf — AB (ref 0–2)
Renal Epithel, UA: NONE SEEN /hpf

## 2020-07-19 MED ORDER — CIPROFLOXACIN HCL 500 MG PO TABS
500.0000 mg | ORAL_TABLET | Freq: Once | ORAL | Status: AC
  Administered 2020-07-19: 500 mg via ORAL

## 2020-07-19 NOTE — Progress Notes (Signed)
° °  07/19/20  CC: gross hematuria  HPI: F/u - for cystoscopy and management of ureteral stone.   1) gross hematuria-patient noted red urine Nov and Dec 2021. No clots. UA 12/21 showed greater than 30 red blood cells per high-powered field, urine cx no growth. He underwent an ultrasound 01/22 which was benign, possible 5 mm LLP (echogenic focus no shadowing). He is a smoker.  Cr 0.78. CT and cystoscopy benign 03/22.   2) left proximal ureteral stone - possible 4-5 mm left proximal stone on CT 03/22. No hydro or other stones. Vascular calcifications also present.   3) BPH-prostate was 58 g on 01/22 renal/bladder ultrasound with some bladder wall thickening/trabeculation and about 50 g on CT 03/22. He has a weak stream, urgency and urge UI. No pad use. PVR is 13 ml. He started tamsulosin 02/22. Not much change in urgency. AUASS = 17, dissatisfied. Cysto today, 03/22, with lateral lobes - good 5ari, OAB med or PUL candidate.   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 He stopped drinking. 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   There were no vitals taken for this visit. NED. A&Ox3.   No respiratory distress   Abd soft, NT, ND Normal phallus with bilateral descended testicles  Cystoscopy Procedure Note  Patient identification was confirmed, informed consent was obtained, and patient was prepped using Betadine solution.  Lidocaine jelly was administered per urethral meatus.     Pre-Procedure: - Inspection reveals a normal caliber ureteral meatus.  Procedure: The flexible cystoscope was introduced without difficulty - No urethral strictures/lesions are present. - obstructing from short lateral lobes. No median lobe. Prostate pushes into bladder 5-10 mm.  - normal bladder neck - Bilateral ureteral orifices identified - clear efflux bilateral noted  - Bladder mucosa reveals no ulcers, tumors, or lesions - No bladder stones - moderate trabeculation  Retroflexion shows normal bladder neck.    Chaperone - Hope    Post-Procedure: - Patient tolerated the procedure well  Assessment/ Plan:  Hematuria - benign eval.   Left ureteral stone - pt without flank pain. Continue to monitor and check KUB and renal US in 3-4 weeks. Continue tams.   BPH - cont tams. Consider 5ari or OAB. Also good PUL candidate.   No follow-ups on file.  Festus Aloe, MD

## 2020-07-19 NOTE — Progress Notes (Signed)
Urological Symptom Review  Patient is experiencing the following symptoms: Hard to postpone urination Get up at night to urinate Stream starts and stops Trouble starting stream Blood in urine Weak stream Erection problems (male only)   Review of Systems  Gastrointestinal (upper)  : Negative for upper GI symptoms  Gastrointestinal (lower) : Negative for lower GI symptoms  Constitutional : Weight loss Fatigue  Skin: Negative for skin symptoms  Eyes: Negative for eye symptoms  Ear/Nose/Throat : Negative for Ear/Nose/Throat symptoms  Hematologic/Lymphatic: Negative for Hematologic/Lymphatic symptoms  Cardiovascular : Negative for cardiovascular symptoms  Respiratory : Negative for respiratory symptoms  Endocrine: Negative for endocrine symptoms  Musculoskeletal: Negative for musculoskeletal symptoms  Neurological: Dizziness  Psychologic: Negative for psychiatric symptoms

## 2020-07-19 NOTE — Patient Instructions (Signed)
Kidney Stones Kidney stones are rock-like masses that form inside of the kidneys. Kidneys are organs that make pee (urine). A kidney stone may move into other parts of the urinary tract, including:  The tubes that connect the kidneys to the bladder (ureters).  The bladder.  The tube that carries urine out of the body (urethra). Kidney stones can cause very bad pain and can block the flow of pee. The stone usually leaves your body (passes) through your pee. You may need to have a doctor take out the stone. What are the causes? Kidney stones may be caused by:  A condition in which certain glands make too much parathyroid hormone (primary hyperparathyroidism).  A buildup of a type of crystals in the bladder made of a chemical called uric acid. The body makes uric acid when you eat certain foods.  Narrowing (stricture) of one or both of the ureters.  A kidney blockage that you were born with.  Past surgery on the kidney or the ureters, such as gastric bypass surgery. What increases the risk? You are more likely to develop this condition if:  You have had a kidney stone in the past.  You have a family history of kidney stones.  You do not drink enough water.  You eat a diet that is high in protein, salt (sodium), or sugar.  You are overweight or very overweight (obese). What are the signs or symptoms? Symptoms of a kidney stone may include:  Pain in the side of the belly, right below the ribs (flank pain). Pain usually spreads (radiates) to the groin.  Needing to pee often or right away (urgently).  Pain when going pee (urinating).  Blood in your pee (hematuria).  Feeling like you may vomit (nauseous).  Vomiting.  Fever and chills. How is this treated? Treatment depends on the size, location, and makeup of the kidney stones. The stones will often pass out of the body through peeing. You may need to:  Drink more fluid to help pass the stone. In some cases, you may be  given fluids through an IV tube put into one of your veins at the hospital.  Take medicine for pain.  Make changes in your diet to help keep kidney stones from coming back. Sometimes, medical procedures are needed to remove a kidney stone. This may involve:  A procedure to break up kidney stones using a beam of light (laser) or shock waves.  Surgery to remove the kidney stones. Follow these instructions at home: Medicines  Take over-the-counter and prescription medicines only as told by your doctor.  Ask your doctor if the medicine prescribed to you requires you to avoid driving or using heavy machinery. Eating and drinking  Drink enough fluid to keep your pee pale yellow. You may be told to drink at least 8-10 glasses of water each day. This will help you pass the stone.  If told by your doctor, change your diet. This may include: ? Limiting how much salt you eat. ? Eating more fruits and vegetables. ? Limiting how much meat, poultry, fish, and eggs you eat.  Follow instructions from your doctor about eating or drinking restrictions. General instructions  Collect pee samples as told by your doctor. You may need to collect a pee sample: ? 24 hours after a stone comes out. ? 8-12 weeks after a stone comes out, and every 6-12 months after that.  Strain your pee every time you pee (urinate), for as long as told. Use the   strainer that your doctor recommends.  Do not throw out the stone. Keep it so that it can be tested by your doctor.  Keep all follow-up visits as told by your doctor. This is important. You may need follow-up tests. How is this prevented? To prevent another kidney stone:  Drink enough fluid to keep your pee pale yellow. This is the best way to prevent kidney stones.  Eat healthy foods.  Avoid certain foods as told by your doctor. You may be told to eat less protein.  Stay at a healthy weight.   Where to find more information  [place]  ([facility]): www.kidney.[facility]): www.urologyhealth.org Contact a doctor if:  You have pain that gets worse or does not get better with medicine. Get help right away if:  You have a fever or chills.  You get very bad pain.  You get new pain in your belly (abdomen).  You pass out (faint).  You cannot pee. Summary  Kidney stones are rock-like masses that form inside of the kidneys.  Kidney stones can cause very bad pain and can block the flow of pee.  The stones will often pass out of the body through peeing.  Drink enough fluid to keep your pee pale yellow. This information is not intended to replace advice given to you by your health care provider. Make sure you discuss any questions you have with your health care provider. Document Revised: 09/17/2018 Document Reviewed: 09/17/2018 Elsevier Patient Education  2021 [place]. Benign Prostatic Hyperplasia  Benign prostatic hyperplasia (BPH) is an enlarged prostate gland that is caused by the normal aging process and not by cancer. The prostate is a walnut-sized gland that is involved in the production of semen. It is located in front of the rectum and below the bladder. The bladder stores urine and the urethra is the tube that carries the urine out of the body. The prostate may get bigger as a man gets older. An enlarged prostate can press on the urethra. This can make it harder to pass urine. The build-up of urine in the bladder can cause infection. Back pressure and infection may progress to bladder damage and kidney (renal) failure. What are the causes? This condition is part of a normal aging process. However, not all men develop problems from this condition. If the prostate enlarges away from the urethra, urine flow will not be blocked. If it enlarges toward the urethra and compresses it, there will be problems passing urine. What increases the risk? This condition is more likely to develop in men over  the age of 55 years. What are the signs or symptoms? Symptoms of this condition include:  Getting up often during the night to urinate.  Needing to urinate frequently during the day.  Difficulty starting urine flow.  Decrease in size and strength of your urine stream.  Leaking (dribbling) after urinating.  Inability to pass urine. This needs immediate treatment.  Inability to completely empty your bladder.  Pain when you pass urine. This is more common if there is also an infection.  Urinary tract infection (UTI). How is this diagnosed? This condition is diagnosed based on your medical history, a physical exam, and your symptoms. Tests will also be done, such as:  A post-void bladder scan. This measures any amount of urine that may remain in your bladder after you finish urinating.  A digital rectal exam. In a rectal exam, your health care provider checks your prostate by  putting a lubricated, gloved finger into your rectum to feel the back of your prostate gland. This exam detects the size of your gland and any abnormal lumps or growths.  An exam of your urine (urinalysis).  A prostate specific antigen (PSA) screening. This is a blood test used to screen for prostate cancer.  An ultrasound. This test uses sound waves to electronically produce a picture of your prostate gland. Your health care provider may refer you to a specialist in kidney and prostate diseases (urologist). How is this treated? Once symptoms begin, your health care provider will monitor your condition (active surveillance or watchful waiting). Treatment for this condition will depend on the severity of your condition. Treatment may include:  Observation and yearly exams. This may be the only treatment needed if your condition and symptoms are mild.  Medicines to relieve your symptoms, including: ? Medicines to shrink the prostate. ? Medicines to relax the muscle of the prostate.  Surgery in severe cases.  Surgery may include: ? Prostatectomy. In this procedure, the prostate tissue is removed completely through an open incision or with a laparoscope or robotics. ? Transurethral resection of the prostate (TURP). In this procedure, a tool is inserted through the opening at the tip of the penis (urethra). It is used to cut away tissue of the inner core of the prostate. The pieces are removed through the same opening of the penis. This removes the blockage. ? Transurethral incision (TUIP). In this procedure, small cuts are made in the prostate. This lessens the prostate's pressure on the urethra. ? Transurethral microwave thermotherapy (TUMT). This procedure uses microwaves to create heat. The heat destroys and removes a small amount of prostate tissue. ? Transurethral needle ablation (TUNA). This procedure uses radio frequencies to destroy and remove a small amount of prostate tissue. ? Interstitial laser coagulation ([place]). This procedure uses a laser to destroy and remove a small amount of prostate tissue. ? Transurethral electrovaporization (TUVP). This procedure uses electrodes to destroy and remove a small amount of prostate tissue. ? Prostatic urethral lift. This procedure inserts an implant to push the lobes of the prostate away from the urethra. Follow these instructions at home:  Take over-the-counter and prescription medicines only as told by your health care provider.  Monitor your symptoms for any changes. Contact your health care provider with any changes.  Avoid drinking large amounts of liquid before going to bed or out in public.  Avoid or reduce how much caffeine or alcohol you drink.  Give yourself time when you urinate.  Keep all follow-up visits as told by your health care provider. This is important. Contact a health care provider if:  You have unexplained back pain.  Your symptoms do not get better with treatment.  You develop side effects from the medicine you are  taking.  Your urine becomes very dark or has a bad smell.  Your lower abdomen becomes distended and you have trouble passing your urine. Get help right away if:  You have a fever or chills.  You suddenly cannot urinate.  You feel lightheaded, or very dizzy, or you faint.  There are large amounts of blood or clots in the urine.  Your urinary problems become hard to manage.  You develop moderate to severe low back or flank pain. The flank is the side of your body between the ribs and the hip. These symptoms may represent a serious problem that is an emergency. Do not wait to see if the symptoms  will go away. Get medical help right away. Call your local emergency services (911 in the [place] Do not drive yourself to the hospital. Summary  Benign prostatic hyperplasia (BPH) is an enlarged prostate that is caused by the normal aging process and not by cancer.  An enlarged prostate can press on the urethra. This can make it hard to pass urine.  This condition is part of a normal aging process and is more likely to develop in men over the age of 73 years.  Get help right away if you suddenly cannot urinate. This information is not intended to replace advice given to you by your health care provider. Make sure you discuss any questions you have with your health care provider. Document Revised: 01/08/2020 Document Reviewed: 01/08/2020 Elsevier Patient Education  [place].

## 2020-07-22 ENCOUNTER — Other Ambulatory Visit: Payer: Self-pay | Admitting: Family Medicine

## 2020-08-10 ENCOUNTER — Ambulatory Visit (HOSPITAL_COMMUNITY): Payer: Medicare HMO

## 2020-09-13 ENCOUNTER — Other Ambulatory Visit: Payer: Self-pay | Admitting: Family Medicine

## 2020-09-13 ENCOUNTER — Ambulatory Visit: Payer: Medicare HMO | Admitting: Urology

## 2020-09-22 DIAGNOSIS — K648 Other hemorrhoids: Secondary | ICD-10-CM | POA: Diagnosis not present

## 2020-09-22 DIAGNOSIS — F17210 Nicotine dependence, cigarettes, uncomplicated: Secondary | ICD-10-CM | POA: Diagnosis not present

## 2020-09-22 DIAGNOSIS — K921 Melena: Secondary | ICD-10-CM | POA: Diagnosis not present

## 2020-09-22 DIAGNOSIS — D122 Benign neoplasm of ascending colon: Secondary | ICD-10-CM | POA: Diagnosis not present

## 2020-09-22 DIAGNOSIS — D125 Benign neoplasm of sigmoid colon: Secondary | ICD-10-CM | POA: Diagnosis not present

## 2020-09-22 DIAGNOSIS — D123 Benign neoplasm of transverse colon: Secondary | ICD-10-CM | POA: Diagnosis not present

## 2020-09-22 DIAGNOSIS — K635 Polyp of colon: Secondary | ICD-10-CM | POA: Diagnosis not present

## 2020-09-22 DIAGNOSIS — E785 Hyperlipidemia, unspecified: Secondary | ICD-10-CM | POA: Diagnosis not present

## 2020-09-22 DIAGNOSIS — Z1211 Encounter for screening for malignant neoplasm of colon: Secondary | ICD-10-CM | POA: Diagnosis not present

## 2020-09-22 DIAGNOSIS — Z86010 Personal history of colonic polyps: Secondary | ICD-10-CM | POA: Diagnosis not present

## 2020-09-22 DIAGNOSIS — K5730 Diverticulosis of large intestine without perforation or abscess without bleeding: Secondary | ICD-10-CM | POA: Diagnosis not present

## 2020-10-03 ENCOUNTER — Other Ambulatory Visit: Payer: Self-pay | Admitting: Family Medicine

## 2020-10-14 ENCOUNTER — Ambulatory Visit (HOSPITAL_COMMUNITY)
Admission: RE | Admit: 2020-10-14 | Discharge: 2020-10-14 | Disposition: A | Discharge status: Home / Self Care | Payer: Medicare HMO | Source: Ambulatory Visit | Attending: Urology | Admitting: Urology

## 2020-10-14 DIAGNOSIS — N201 Calculus of ureter: Secondary | ICD-10-CM | POA: Diagnosis not present

## 2020-10-14 DIAGNOSIS — R310 Gross hematuria: Secondary | ICD-10-CM | POA: Insufficient documentation

## 2020-10-14 DIAGNOSIS — K7469 Other cirrhosis of liver: Secondary | ICD-10-CM | POA: Diagnosis not present

## 2020-10-18 ENCOUNTER — Other Ambulatory Visit: Payer: Self-pay

## 2020-10-18 ENCOUNTER — Encounter: Payer: Self-pay | Admitting: Urology

## 2020-10-18 ENCOUNTER — Ambulatory Visit (INDEPENDENT_AMBULATORY_CARE_PROVIDER_SITE_OTHER): Payer: Medicare HMO | Admitting: Urology

## 2020-10-18 VITALS — BP 74/44 | HR 72 | Ht 69.0 in | Wt 167.4 lb

## 2020-10-18 DIAGNOSIS — R8271 Bacteriuria: Secondary | ICD-10-CM | POA: Diagnosis not present

## 2020-10-18 DIAGNOSIS — R3911 Hesitancy of micturition: Secondary | ICD-10-CM | POA: Diagnosis not present

## 2020-10-18 DIAGNOSIS — N138 Other obstructive and reflux uropathy: Secondary | ICD-10-CM | POA: Diagnosis not present

## 2020-10-18 DIAGNOSIS — R310 Gross hematuria: Secondary | ICD-10-CM

## 2020-10-18 DIAGNOSIS — N401 Enlarged prostate with lower urinary tract symptoms: Secondary | ICD-10-CM | POA: Diagnosis not present

## 2020-10-18 LAB — MICROSCOPIC EXAMINATION: Renal Epithel, UA: NONE SEEN /hpf

## 2020-10-18 LAB — URINALYSIS, ROUTINE W REFLEX MICROSCOPIC
Bilirubin, UA: NEGATIVE
Ketones, UA: NEGATIVE
Nitrite, UA: NEGATIVE
Specific Gravity, UA: 1.02 (ref 1.005–1.030)
Urobilinogen, Ur: 2 mg/dL — ABNORMAL HIGH (ref 0.2–1.0)
pH, UA: 7 (ref 5.0–7.5)

## 2020-10-18 MED ORDER — FINASTERIDE 5 MG PO TABS: 5.0000 mg | ORAL_TABLET | Freq: Every day | ORAL | 3 refills | Status: DC

## 2020-10-18 MED ORDER — TAMSULOSIN HCL 0.4 MG PO CAPS: 0.4000 mg | ORAL_CAPSULE | Freq: Every day | ORAL | 3 refills | Status: DC

## 2020-10-18 NOTE — Progress Notes (Signed)
10/18/2020 9:49 AM   Spencer Stanley 04-28-1961 [id number]  Referring provider: Claretta Fraise, MD [place],  [place] [address]  No chief complaint on file.   HPI:  F/u -   1) gross hematuria-patient noted red urineNov and Dec 2021. No clots.UA 12/21 showed greater than 30 red blood cells per high-powered field, urine cx no growth.He underwent an ultrasound 01/22 which was benign, possible 5 mm LLP (echogenic focus no shadowing).He is a smoker. Cr 0.78. CT and cystoscopy benign 03/22. 06/22 renal US benign. He had one flash of blood in the urine last week it clear. UA today with mod bac and 3-10 rbc.   2) left proximal ureteral stone - possible 4-5 mm left proximal stone on CT 03/22. No hydro or other stones. Vascular calcifications also present. 06/22 renal US with no stone or hydro. KUB ordered not done. No flank pain.   3) BPH-prostate was 58 g on 01/22 renal/bladder ultrasoundwith some bladder wall thickening/trabeculation and about 50 g on CT 03/22. PSA 0.9 12/20. He has a weak stream, urgency and urge UI. No pad use. PVR is 13 ml. He started tamsulosin 02/22. Not much change in urgency. AUASS = 17, dissatisfied. Cysto 03/22, with lateral lobes - good 5ari, OAB med or PUL candidate. He continues to have some hesitancy.   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 His BP is kept low as he's had varices rupture. He stopped drinking. 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  Today, seen for the above.   PMH: Past Medical History:  Diagnosis Date  . Anxiety   . Cirrhosis ([place])   . Cirrhosis of liver ([place])   . Diabetes mellitus without complication ([place])   . Esophageal varices ([place])   . High cholesterol   . Lyme disease   . [place] spotted fever     Surgical History: Past Surgical History:  Procedure Laterality Date  . ESOPHAGEAL VARICE LIGATION     TIPS  . 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       Medication List       Accurate as of October 18, 2020  9:49 AM. If you have any questions, ask your nurse or doctor.        Accu-Chek Aviva Plus test strip Generic drug: glucose blood CHECK BLOOD SUGAR THREE TIMES DAILY Dx E11.9   Accu-Chek Aviva Soln Use with Accu-Chek meter   Accu-Chek Softclix Lancets lancets check blood sugars three times daily dx E11.9   atorvastatin 10 MG tablet Commonly known as: LIPITOR TAKE 1 TABLET EVERY DAY   B-D SINGLE USE SWABS REGULAR Pads CHECK BLOOD SUGARS THREE TIMES DAILY Dx E11.9   CALCIUM-MAGNESIUM-ZINC PO Take 1 capsule by mouth 2 (two) times daily.   FLUoxetine 20 MG capsule Commonly known as: PROZAC TAKE 1 CAPSULE EVERY DAY   lactulose 10 GM/15ML solution Commonly known as: CHRONULAC Take 45 g by mouth 3 (three) times daily.   metFORMIN 750 MG 24 hr tablet Commonly known as: GLUCOPHAGE-XR TAKE 1 TABLET EVERY DAY WITH BREAKFAST   minocycline 100 MG capsule Commonly known as: MINOCIN TAKE 1 CAPSULE TWICE DAILY   multivitamin Tabs tablet Take 1 tablet by mouth daily.   pantoprazole 40 MG tablet Commonly known as: PROTONIX TAKE  1 TABLET EVERY DAY   rifaximin 550 MG Tabs tablet Commonly known as: XIFAXAN Take 1 tablet (550 mg total) by mouth 2 (two) times daily.   sildenafil 20 MG tablet Commonly known as: REVATIO Take 2-5 pills at once, orally, with each sexual encounter   spironolactone 25 MG tablet Commonly known as: ALDACTONE TAKE 1 TABLET EVERY DAY   tamsulosin 0.4 MG Caps capsule Commonly known as: FLOMAX Take 1 capsule (0.4 mg total) by mouth daily after supper.   ZINC PO Take by mouth.   zinc sulfate 220 (50 Zn) MG capsule Take 1 capsule by mouth daily.       Allergies:  Allergies  Allergen Reactions  . Other Other (See Comments)    Other reaction(s): Mental  Status Changes (intolerance) After medicated induced coma patient seeing things that weren't there.     Family History: Family History  Problem Relation Age of Onset  . Aneurysm Mother   . Cancer Mother        breast  . Dementia Father   . Cancer Father        brain tumor  . Heart failure Father   . Diabetes Sister     Social History:  reports that he has been smoking. He has a 16.50 pack-year smoking history. He quit smokeless tobacco use about 41 years ago.  His smokeless tobacco use included chew. He reports that he does not drink alcohol and does not use drugs.   Physical Exam: There were no vitals taken for this visit.  Constitutional:  Alert and oriented, No acute distress. HEENT:  AT, moist mucus membranes.  Trachea midline, no masses. Cardiovascular: No clubbing, cyanosis, or edema. Respiratory: Normal respiratory effort, no increased work of breathing. GI: Abdomen is soft, nontender, nondistended, no abdominal masses GU: No CVA tenderness Skin: No rashes, bruises or suspicious lesions. Neurologic: Grossly intact, no focal deficits, moving all 4 extremities. Psychiatric: Normal mood and affect.  Laboratory Data: Lab Results  Component Value Date   WBC 5.2 04/29/2020   HGB 13.5 04/29/2020   HCT 40.5 04/29/2020   MCV 87 04/29/2020   PLT 108 (L) 04/29/2020    Lab Results  Component Value Date   CREATININE 0.80 07/16/2020    No results found for: PSA  Lab Results  Component Value Date   TESTOSTERONE 913 06/20/2014    Lab Results  Component Value Date   HGBA1C 5.8 04/29/2020    Urinalysis    Component Value Date/Time   APPEARANCEUR Clear 07/19/2020 1120   GLUCOSEU Negative 07/19/2020 1120   BILIRUBINUR Negative 07/19/2020 1120   PROTEINUR 1+ (A) 07/19/2020 1120   NITRITE Negative 07/19/2020 1120   LEUKOCYTESUR 1+ (A) 07/19/2020 1120    Lab Results  Component Value Date   LABMICR See below: 07/19/2020   WBCUA 6-10 (A) 07/19/2020   LABEPIT  0-10 07/19/2020   MUCUS Present 07/19/2020   BACTERIA None seen 07/19/2020    Pertinent Imaging: [place], CT images reviewed - 2022  No results found for this or any previous visit.  No results found for this or any previous visit.  No results found for this or any previous visit.  No results found for this or any previous visit.  Results for orders placed during the hospital encounter of 10/14/20  US RENAL  Narrative CLINICAL DATA:  Left ureteral stone.  EXAM: RENAL / URINARY TRACT ULTRASOUND COMPLETE  COMPARISON:  CT abdomen and pelvis 07/16/2020. Renal ultrasound 05/18/2020.  FINDINGS: Right Kidney:  Renal measurements: 11.6 x 6.0 x 5.9 cm = volume: 214 mL. Echogenicity within normal limits. No mass or hydronephrosis visualized.  Left Kidney:  Renal measurements: 11.3 x 5.2 x 6.0 cm = volume: 185 mL. Echogenicity within normal limits. No mass or hydronephrosis visualized.  Bladder:  Mild circumferential bladder wall thickening which may be at least in part due to underdistension.  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  IMPRESSION: 1. Unremarkable sonographic appearance of the kidneys. 2. Mild circumferential bladder wall thickening which may be at least in part due to underdistension although cystitis is also possible. 3. Splenomegaly.   Electronically Signed By: Logan Bores M.D. On: 10/14/2020 13:51  No results found for this or any previous visit.  No results found for this or any previous visit.  No results found for this or any previous visit.   Assessment & Plan:    1. Gross hematuria - discussed nature r/b/a of adding 5ari and will start finasteride.   2. bph - cont tams - as above. Start 5ari.   3. Bacteruria - no dysuria. Drink plenty of water. Urine for cx. BP a little low today but kept low .    No follow-ups on file.  Festus Aloe, MD

## 2020-10-18 NOTE — Patient Instructions (Signed)

## 2020-10-18 NOTE — Progress Notes (Signed)
Urological Symptom Review  Patient is experiencing the following symptoms: Frequent urination Get up at night to urinate Stream starts and stops Blood in urine Weak stream Erection problems (male only)   Review of Systems  Gastrointestinal (upper)  : Negative for upper GI symptoms  Gastrointestinal (lower) : Negative for lower GI symptoms  Constitutional : Negative for symptoms  Skin: Negative for skin symptoms  Eyes: Negative for eye symptoms  Ear/Nose/Throat : Negative for Ear/Nose/Throat symptoms  Hematologic/Lymphatic: Negative for Hematologic/Lymphatic symptoms  Cardiovascular : Negative for cardiovascular symptoms  Respiratory : Negative for respiratory symptoms  Endocrine: Negative for endocrine symptoms  Musculoskeletal: Negative for musculoskeletal symptoms  Neurological: Dizziness  Psychologic: Negative for psychiatric symptoms

## 2020-10-20 LAB — URINE CULTURE: Organism ID, Bacteria: NO GROWTH

## 2020-10-28 ENCOUNTER — Ambulatory Visit: Payer: Medicare HMO | Admitting: Family Medicine

## 2020-11-01 ENCOUNTER — Ambulatory Visit (INDEPENDENT_AMBULATORY_CARE_PROVIDER_SITE_OTHER): Payer: Medicare HMO | Admitting: Family Medicine

## 2020-11-01 ENCOUNTER — Encounter: Payer: Self-pay | Admitting: Family Medicine

## 2020-11-01 ENCOUNTER — Other Ambulatory Visit: Payer: Self-pay | Admitting: Family Medicine

## 2020-11-01 ENCOUNTER — Other Ambulatory Visit: Payer: Self-pay

## 2020-11-01 VITALS — BP 82/51 | HR 90 | Temp 98.2°F | Ht 69.0 in | Wt 163.8 lb

## 2020-11-01 DIAGNOSIS — K7030 Alcoholic cirrhosis of liver without ascites: Secondary | ICD-10-CM | POA: Diagnosis not present

## 2020-11-01 DIAGNOSIS — E782 Mixed hyperlipidemia: Secondary | ICD-10-CM | POA: Diagnosis not present

## 2020-11-01 DIAGNOSIS — E119 Type 2 diabetes mellitus without complications: Secondary | ICD-10-CM

## 2020-11-01 LAB — BAYER DCA HB A1C WAIVED: HB A1C (BAYER DCA - WAIVED): 6.3 % (ref <7.0)

## 2020-11-01 NOTE — Progress Notes (Signed)
Subjective:  Patient ID: Spencer Stanley, male    DOB: [birth year]  Age: 60 y.o. MRN: [id number]  CC: Medical Management of Chronic Issues   HPI Spencer Stanley presents forFollow-up of diabetes. Patient not checking blood sugar at home.  Patient denies symptoms such as polyuria, polydipsia, excessive hunger, nausea No significant hypoglycemic spells noted. Medications reviewed. Pt reports taking them regularly without complication/adverse reaction being reported today.   Getting tired easy. Needs to work out. Plans to start riding the stationery bike today. ...  History Spencer Stanley has a past medical history of Anxiety, Cirrhosis ([place]), Cirrhosis of liver ([place]), Diabetes mellitus without complication ([place]), Esophageal varices ([place]), High cholesterol, Lyme disease, and [facility] spotted fever.   Spencer Stanley has a past surgical history that includes Hernia repair and Esophageal varice ligation.   His family history includes Aneurysm in his mother; Cancer in his father and mother; Dementia in his father; Diabetes in his sister; Heart failure in his father.Spencer Stanley reports that Spencer Stanley has been smoking. Spencer Stanley has a 16.50 pack-year smoking history. Spencer Stanley quit smokeless tobacco use about 41 years ago.  His smokeless tobacco use included chew. Spencer Stanley reports that Spencer Stanley does not drink alcohol and does not use drugs.  Current Outpatient Medications on File Prior to Visit  Medication Sig Dispense Refill   Accu-Chek Softclix Lancets lancets check blood sugars three times daily dx E11.9 300 each 3   Alcohol Swabs (B-D SINGLE USE SWABS REGULAR) PADS CHECK BLOOD SUGARS THREE TIMES DAILY Dx E11.9 300 each 3   atorvastatin (LIPITOR) 10 MG tablet TAKE 1 TABLET EVERY DAY 90 tablet 1   Blood Glucose Calibration (ACCU-CHEK AVIVA) SOLN Use with Accu-Chek meter 1 each 0   CALCIUM-MAGNESIUM-ZINC PO Take 1 capsule by mouth 2 (two) times daily.     finasteride (PROSCAR) 5 MG tablet Take 1 tablet (5 mg total) by mouth daily. 90 tablet 3   FLUoxetine  (PROZAC) 20 MG capsule TAKE 1 CAPSULE EVERY DAY 90 capsule 0   glucose blood (ACCU-CHEK AVIVA PLUS) test strip CHECK BLOOD SUGAR THREE TIMES DAILY Dx E11.9 300 strip 3   lactulose (CHRONULAC) 10 GM/15ML solution Take 45 g by mouth 3 (three) times daily.     metFORMIN (GLUCOPHAGE-XR) 750 MG 24 hr tablet TAKE 1 TABLET EVERY DAY WITH BREAKFAST 90 tablet 0   minocycline (MINOCIN) 100 MG capsule TAKE 1 CAPSULE TWICE DAILY 180 capsule 0   Multiple Vitamins-Minerals (ZINC PO) Take by mouth.     multivitamin (ONE-A-DAY MEN'S) TABS tablet Take 1 tablet by mouth daily.     pantoprazole (PROTONIX) 40 MG tablet TAKE 1 TABLET EVERY DAY 90 tablet 0   rifaximin (XIFAXAN) 550 MG TABS tablet Take 1 tablet (550 mg total) by mouth 2 (two) times daily. 60 tablet 5   sildenafil (REVATIO) 20 MG tablet Take 2-5 pills at once, orally, with each sexual encounter 50 tablet 5   spironolactone (ALDACTONE) 25 MG tablet TAKE 1 TABLET EVERY DAY 90 tablet 0   tamsulosin (FLOMAX) 0.4 MG CAPS capsule Take 1 capsule (0.4 mg total) by mouth daily after supper. 90 capsule 3   zinc sulfate 220 (50 Zn) MG capsule Take 1 capsule by mouth daily.     No current facility-administered medications on file prior to visit.    ROS Review of Systems  Constitutional:  Positive for fatigue. Negative for fever.  Respiratory:  Negative for shortness of breath.   Cardiovascular:  Negative for chest pain.  Musculoskeletal:  Negative for arthralgias.  Skin:  Negative for rash.   Objective:  BP (!) 82/51   Pulse 90   Temp 98.2 F (36.8 C)   Ht '5\' 9"'  (1.753 m)   Wt 163 lb 12.8 oz (74.3 kg)   SpO2 99%   BMI 24.19 kg/m   BP Readings from Last 3 Encounters:  11/01/20 (!) 82/51  10/18/20 (!) 74/44  07/19/20 105/67    Wt Readings from Last 3 Encounters:  11/01/20 163 lb 12.8 oz (74.3 kg)  10/18/20 167 lb 6.4 oz (75.9 kg)  06/21/20 160 lb (72.6 kg)     Physical Exam Vitals reviewed.  Constitutional:      Appearance: Spencer Stanley is  well-developed.  HENT:     Head: Normocephalic and atraumatic.     Right Ear: External ear normal.     Left Ear: External ear normal.     Mouth/Throat:     Pharynx: No oropharyngeal exudate or posterior oropharyngeal erythema.  Eyes:     Pupils: Pupils are equal, round, and reactive to light.  Cardiovascular:     Rate and Rhythm: Normal rate and regular rhythm.     Heart sounds: No murmur heard. Pulmonary:     Effort: No respiratory distress.     Breath sounds: Normal breath sounds.  Musculoskeletal:     Cervical back: Normal range of motion and neck supple.  Neurological:     Mental Status: Spencer Stanley is alert and oriented to person, place, and time.      Assessment & Plan:   Spencer Stanley was seen today for medical management of chronic issues.  Diagnoses and all orders for this visit:  Type 2 diabetes mellitus without complication, without long-term current use of insulin (HCC) -     Bayer DCA Hb A1c Waived -     CBC with Differential/Platelet -     CMP14+EGFR -     Microalbumin / creatinine urine ratio  Mixed hyperlipidemia -     Lipid panel  Alcoholic cirrhosis of liver without ascites (HCC) -     Ammonia     I am having Spencer Stanley maintain his multivitamin, CALCIUM-MAGNESIUM-ZINC PO, rifaximin, Multiple Vitamins-Minerals (ZINC PO), lactulose, Accu-Chek Aviva, sildenafil, minocycline, Accu-Chek Softclix Lancets, B-D SINGLE USE SWABS REGULAR, zinc sulfate, atorvastatin, Accu-Chek Aviva Plus, FLUoxetine, metFORMIN, spironolactone, pantoprazole, finasteride, and tamsulosin.  No orders of the defined types were placed in this encounter.    Follow-up: Return in about 3 months (around 02/01/2021).  Claretta Fraise, M.D.

## 2020-11-01 NOTE — Patient Instructions (Signed)
https://www.diabeteseducator.org/docs/default-source/living-with-diabetes/conquering-the-grocery-store-v1.pdf?sfvrsn=4">  Carbohydrate Counting for Diabetes Mellitus, Adult Carbohydrate counting is a method of keeping track of how many carbohydrates you eat. Eating carbohydrates naturally increases the amount of sugar (glucose) in the blood. Counting how many carbohydrates you eat improves your bloodglucose control, which helps you manage your diabetes. It is important to know how many carbohydrates you can safely have in each meal. This is different for every person. A dietitian can help you make a meal plan and calculate how many carbohydrates you should have at each meal andsnack. What foods contain carbohydrates? Carbohydrates are found in the following foods: Grains, such as breads and cereals. Dried beans and soy products. Starchy vegetables, such as potatoes, peas, and corn. Fruit and fruit juices. Milk and yogurt. Sweets and snack foods, such as cake, cookies, candy, chips, and soft drinks. How do I count carbohydrates in foods? There are two ways to count carbohydrates in food. You can read food labels or learn standard serving sizes of foods. You can use either of the methods or acombination of both. Using the Nutrition Facts label The Nutrition Facts list is included on the labels of almost all packaged foods and beverages in the [place] It includes: The serving size. Information about nutrients in each serving, including the grams (g) of carbohydrate per serving. To use the Nutrition Facts: Decide how many servings you will have. Multiply the number of servings by the number of carbohydrates per serving. The resulting number is the total amount of carbohydrates that you will be having. Learning the standard serving sizes of foods When you eat carbohydrate foods that are not packaged or do not include Nutrition Facts on the label, you need to measure the servings in order to count the  amount of carbohydrates. Measure the foods that you will eat with a food scale or measuring cup, if needed. Decide how many standard-size servings you will eat. Multiply the number of servings by 15. For foods that contain carbohydrates, one serving equals 15 g of carbohydrates. For example, if you eat 2 cups or 10 oz (300 g) of strawberries, you will have eaten 2 servings and 30 g of carbohydrates (2 servings x 15 g = 30 g). For foods that have more than one food mixed, such as soups and casseroles, you must count the carbohydrates in each food that is included. The following list contains standard serving sizes of common carbohydrate-rich foods. Each of these servings has about 15 g of carbohydrates: 1 slice of bread. 1 six-inch (15 cm) tortilla. ? cup or 2 oz (53 g) cooked rice or pasta.  cup or 3 oz (85 g) cooked or canned, drained and rinsed beans or lentils.  cup or 3 oz (85 g) starchy vegetable, such as peas, corn, or squash.  cup or 4 oz (120 g) hot cereal.  cup or 3 oz (85 g) boiled or mashed potatoes, or  or 3 oz (85 g) of a large baked potato.  cup or 4 fl oz (118 mL) fruit juice. 1 cup or 8 fl oz (237 mL) milk. 1 small or 4 oz (106 g) apple.  or 2 oz (63 g) of a medium banana. 1 cup or 5 oz (150 g) strawberries. 3 cups or 1 oz (24 g) popped popcorn. What is an example of carbohydrate counting? To calculate the number of carbohydrates in this sample meal, follow the [place] below. Sample meal 3 oz (85 g) chicken breast. ? cup or 4 oz (106 g) brown rice.    cup or 3 oz (85 g) corn. 1 cup or 8 fl oz (237 mL) milk. 1 cup or 5 oz (150 g) strawberries with sugar-free whipped topping. Carbohydrate calculation Identify the foods that contain carbohydrates: Rice. Corn. Milk. Strawberries. Calculate how many servings you have of each food: 2 servings rice. 1 serving corn. 1 serving milk. 1 serving strawberries. Multiply each number of servings by 15 g: 2 servings  rice x 15 g = 30 g. 1 serving corn x 15 g = 15 g. 1 serving milk x 15 g = 15 g. 1 serving strawberries x 15 g = 15 g. Add together all of the amounts to find the total grams of carbohydrates eaten: 30 g + 15 g + 15 g + 15 g = 75 g of carbohydrates total. What are tips for following this plan? Shopping Develop a meal plan and then make a shopping list. Buy fresh and frozen vegetables, fresh and frozen fruit, dairy, eggs, beans, lentils, and whole grains. Look at food labels. Choose foods that have more fiber and less sugar. Avoid processed foods and foods with added sugars. Meal planning Aim to have the same amount of carbohydrates at each meal and for each snack time. Plan to have regular, balanced meals and snacks. Where to find more information American Diabetes Association: www.diabetes.org Centers for Disease Control and Prevention: www.cdc.gov Summary Carbohydrate counting is a method of keeping track of how many carbohydrates you eat. Eating carbohydrates naturally increases the amount of sugar (glucose) in the blood. Counting how many carbohydrates you eat improves your blood glucose control, which helps you manage your diabetes. A dietitian can help you make a meal plan and calculate how many carbohydrates you should have at each meal and snack. This information is not intended to replace advice given to you by your health care provider. Make sure you discuss any questions you have with your healthcare provider. Document Revised: 05/01/2019 Document Reviewed: 05/02/2019 Elsevier Patient Education  2021 Elsevier Inc.  

## 2020-11-02 LAB — CMP14+EGFR
ALT: 15 IU/L (ref 0–44)
AST: 24 IU/L (ref 0–40)
Albumin/Globulin Ratio: 1.5 (ref 1.2–2.2)
Albumin: 3.8 g/dL (ref 3.8–4.9)
Alkaline Phosphatase: 111 IU/L (ref 44–121)
BUN/Creatinine Ratio: 11 (ref 10–24)
BUN: 10 mg/dL (ref 8–27)
Bilirubin Total: 0.9 mg/dL (ref 0.0–1.2)
CO2: 22 mmol/L (ref 20–29)
Calcium: 9.3 mg/dL (ref 8.6–10.2)
Chloride: 107 mmol/L — ABNORMAL HIGH (ref 96–106)
Creatinine, Ser: 0.9 mg/dL (ref 0.76–1.27)
Globulin, Total: 2.5 g/dL (ref 1.5–4.5)
Glucose: 103 mg/dL — ABNORMAL HIGH (ref 65–99)
Potassium: 4.2 mmol/L (ref 3.5–5.2)
Sodium: 143 mmol/L (ref 134–144)
Total Protein: 6.3 g/dL (ref 6.0–8.5)
eGFR: 98 mL/min/{1.73_m2} (ref >59)

## 2020-11-02 LAB — CBC WITH DIFFERENTIAL/PLATELET
Basophils Absolute: 0.1 10*3/uL (ref 0.0–0.2)
Basos: 1 %
EOS (ABSOLUTE): 0.2 10*3/uL (ref 0.0–0.4)
Eos: 5 %
Hematocrit: 36.8 % — ABNORMAL LOW (ref 37.5–51.0)
Hemoglobin: 12.7 g/dL — ABNORMAL LOW (ref 13.0–17.7)
Immature Grans (Abs): 0 10*3/uL (ref 0.0–0.1)
Immature Granulocytes: 0 %
Lymphocytes Absolute: 1 10*3/uL (ref 0.7–3.1)
Lymphs: 25 %
MCH: 29.5 pg (ref 26.6–33.0)
MCHC: 34.5 g/dL (ref 31.5–35.7)
MCV: 85 fL (ref 79–97)
Monocytes Absolute: 0.4 10*3/uL (ref 0.1–0.9)
Monocytes: 11 %
Neutrophils Absolute: 2.1 10*3/uL (ref 1.4–7.0)
Neutrophils: 58 %
Platelets: 72 10*3/uL — CL (ref 150–450)
RBC: 4.31 x10E6/uL (ref 4.14–5.80)
RDW: 13.6 % (ref 11.6–15.4)
WBC: 3.8 10*3/uL (ref 3.4–10.8)

## 2020-11-02 LAB — LIPID PANEL
Chol/HDL Ratio: 2.9 ratio (ref 0.0–5.0)
Cholesterol, Total: 112 mg/dL (ref 100–199)
HDL: 39 mg/dL — ABNORMAL LOW (ref >39)
LDL Chol Calc (NIH): 55 mg/dL (ref 0–99)
Triglycerides: 91 mg/dL (ref 0–149)
VLDL Cholesterol Cal: 18 mg/dL (ref 5–40)

## 2020-11-02 LAB — MICROALBUMIN / CREATININE URINE RATIO
Creatinine, Urine: 250.6 mg/dL
Microalb/Creat Ratio: 70 mg/g creat — ABNORMAL HIGH (ref 0–29)
Microalbumin, Urine: 176.5 ug/mL

## 2020-11-02 LAB — AMMONIA: Ammonia: 102 ug/dL (ref 40–200)

## 2020-11-02 NOTE — Progress Notes (Signed)
Hello [place],  Your lab result is normal and/or stable.Some minor variations that are not significant are commonly marked abnormal, but do not represent any medical problem for you.  Best regards, Linwood Gullikson, M.D.

## 2020-11-22 ENCOUNTER — Other Ambulatory Visit: Payer: Self-pay | Admitting: Family Medicine

## 2020-12-16 DIAGNOSIS — H5200 Hypermetropia, unspecified eye: Secondary | ICD-10-CM | POA: Diagnosis not present

## 2020-12-16 DIAGNOSIS — E119 Type 2 diabetes mellitus without complications: Secondary | ICD-10-CM | POA: Diagnosis not present

## 2020-12-16 DIAGNOSIS — E7800 Pure hypercholesterolemia, unspecified: Secondary | ICD-10-CM | POA: Diagnosis not present

## 2020-12-16 LAB — HM DIABETES EYE EXAM

## 2020-12-31 ENCOUNTER — Ambulatory Visit (INDEPENDENT_AMBULATORY_CARE_PROVIDER_SITE_OTHER): Payer: Medicare HMO | Admitting: Family Medicine

## 2020-12-31 ENCOUNTER — Other Ambulatory Visit: Payer: Self-pay | Admitting: Family Medicine

## 2020-12-31 ENCOUNTER — Encounter: Payer: Self-pay | Admitting: Family Medicine

## 2020-12-31 DIAGNOSIS — N521 Erectile dysfunction due to diseases classified elsewhere: Secondary | ICD-10-CM

## 2020-12-31 DIAGNOSIS — E1169 Type 2 diabetes mellitus with other specified complication: Secondary | ICD-10-CM

## 2020-12-31 MED ORDER — SILDENAFIL CITRATE 20 MG PO TABS: ORAL_TABLET | ORAL | 0 refills | Status: DC

## 2020-12-31 NOTE — Progress Notes (Signed)
Virtual Visit via telephone Note Due to COVID-19 pandemic this visit was conducted virtually. This visit type was conducted due to national recommendations for restrictions regarding the COVID-19 Pandemic (e.g. social distancing, sheltering in place) in an effort to limit this patient's exposure and mitigate transmission in our community. All issues noted in this document were discussed and addressed.  A physical exam was not performed with this format.   I connected with Spencer Stanley on 12/31/2020 at 53 by telephone and verified that I am speaking with the correct person using two identifiers. Spencer Stanley is currently located at home and family is currently with them during visit. The provider, Monia Pouch, FNP is located in their office at time of visit.  I discussed the limitations, risks, security and privacy concerns of performing an evaluation and management service by telephone and the availability of in person appointments. I also discussed with the patient that there may be a patient responsible charge related to this service. The patient expressed understanding and agreed to proceed.  Subjective:  Patient ID: Spencer Stanley, male    DOB: [birth year], 59 y.o.   MRN: [id number]  Chief Complaint:  Erectile Dysfunction   HPI: Spencer Stanley is a 61 y.o. male presenting on 12/31/2020 for Erectile Dysfunction   Pt states he has ongoing ED and is out of his viagra and would like a refill. States medication works well when he takes it. Denies prolonged erection, hypotension, weakness, dizziness, blurred vision, or syncope with use.   Erectile Dysfunction This is a chronic problem. The problem is unchanged. The nature of his difficulty is maintaining erection and achieving erection. Irritative symptoms do not include frequency or urgency. Pertinent negatives include no dysuria or hematuria. Past treatments include sildenafil. The treatment provided significant relief. He has had no adverse reactions  caused by medications. Risk factors include diabetes mellitus.    Relevant past medical, surgical, family, and social history reviewed and updated as indicated.  Allergies and medications reviewed and updated.   Past Medical History:  Diagnosis Date   Anxiety    Cirrhosis ([place])    Cirrhosis of liver ([place])    Diabetes mellitus without complication ([place])    Esophageal varices (HCC)    High cholesterol    Lyme disease    [place] spotted fever     Past Surgical History:  Procedure Laterality Date   ESOPHAGEAL VARICE LIGATION     TIPS   HERNIA REPAIR      Social History   Socioeconomic History   Marital status: Soil scientist    Spouse name: Not on file   Number of children: 0   Years of education: 16   Highest education level: Bachelor's degree (e.g., BA, AB, BS)  Occupational History   Not on file  Tobacco Use   Smoking status: Every Day    Packs/day: 0.50    Years: 33.00    Pack years: 16.50    Types: Cigarettes   Smokeless tobacco: Former    Types: Chew    Quit date: 1981   Tobacco comments:    03/21/2018 cut back to 1/2 ppd  Vaping Use   Vaping Use: Never used  Substance and Sexual Activity   Alcohol use: No    Comment: no alcohol since 2013   Drug use: No   Sexual activity: Yes  Other Topics Concern   Not on file  Social History Narrative   Not on file   Social Determinants of Health  Financial Resource Strain: Not on file  Food Insecurity: Not on file  Transportation Needs: Not on file  Physical Activity: Not on file  Stress: Not on file  Social Connections: Not on file  Intimate Partner Violence: Not on file    Outpatient Encounter Medications as of 12/31/2020  Medication Sig   Accu-Chek Softclix Lancets lancets check blood sugars three times daily dx E11.9   Alcohol Swabs (B-D SINGLE USE SWABS REGULAR) PADS CHECK BLOOD SUGARS THREE TIMES DAILY Dx E11.9   atorvastatin (LIPITOR) 10 MG tablet TAKE 1 TABLET EVERY DAY   Blood Glucose  Calibration (ACCU-CHEK AVIVA) SOLN Use with Accu-Chek meter   CALCIUM-MAGNESIUM-ZINC PO Take 1 capsule by mouth 2 (two) times daily.   finasteride (PROSCAR) 5 MG tablet Take 1 tablet (5 mg total) by mouth daily.   FLUoxetine (PROZAC) 20 MG capsule TAKE 1 CAPSULE EVERY DAY   glucose blood (ACCU-CHEK AVIVA PLUS) test strip CHECK BLOOD SUGAR THREE TIMES DAILY Dx E11.9   lactulose (CHRONULAC) 10 GM/15ML solution Take 45 g by mouth 3 (three) times daily.   metFORMIN (GLUCOPHAGE-XR) 750 MG 24 hr tablet TAKE 1 TABLET EVERY DAY WITH BREAKFAST   minocycline (MINOCIN) 100 MG capsule TAKE 1 CAPSULE TWICE DAILY   Multiple Vitamins-Minerals (ZINC PO) Take by mouth.   multivitamin (ONE-A-DAY MEN'S) TABS tablet Take 1 tablet by mouth daily.   pantoprazole (PROTONIX) 40 MG tablet TAKE 1 TABLET EVERY DAY   rifaximin (XIFAXAN) 550 MG TABS tablet Take 1 tablet (550 mg total) by mouth 2 (two) times daily.   sildenafil (REVATIO) 20 MG tablet Take 2-5 pills at once, orally, with each sexual encounter   spironolactone (ALDACTONE) 25 MG tablet TAKE 1 TABLET EVERY DAY   tamsulosin (FLOMAX) 0.4 MG CAPS capsule Take 1 capsule (0.4 mg total) by mouth daily after supper.   zinc sulfate 220 (50 Zn) MG capsule Take 1 capsule by mouth daily.   [DISCONTINUED] sildenafil (REVATIO) 20 MG tablet Take 2-5 pills at once, orally, with each sexual encounter   No facility-administered encounter medications on file as of 12/31/2020.    Allergies  Allergen Reactions   Other Other (See Comments)    Other reaction(s): Mental Status Changes (intolerance) After medicated induced coma patient seeing things that weren't there.     Review of Systems  Constitutional: Negative.   Eyes: Negative.   Respiratory:  Negative for cough, chest tightness and shortness of breath.   Cardiovascular:  Negative for chest pain, palpitations and leg swelling.  Genitourinary:  Negative for decreased urine volume, difficulty urinating, dysuria,  enuresis, flank pain, frequency, genital sores, hematuria, penile discharge, penile pain, penile swelling, scrotal swelling, testicular pain and urgency.  Neurological: Negative.         Observations/Objective: No vital signs or physical exam, this was a telephone or virtual health encounter.  Pt alert and oriented, answers all questions appropriately, and able to speak in full sentences.    Assessment and Plan: [place] was seen today for erectile dysfunction.  Diagnoses and all orders for this visit:  Erectile dysfunction associated with type 2 diabetes mellitus ([place]) Long discussion about risks of medication use with comorbidities. Discussed with pts PCP, Dr. Livia Snellen, and he feels appropriate to fill prescription. Quality of life plays a factor in decision making. Pt aware of side effects which require emergent evaluation. Discussed proper use of medication.  -     sildenafil (REVATIO) 20 MG tablet; Take 2-5 pills at once, orally, with each sexual encounter  Follow Up Instructions: Return if symptoms worsen or fail to improve.    I discussed the assessment and treatment plan with the patient. The patient was provided an opportunity to ask questions and all were answered. The patient agreed with the plan and demonstrated an understanding of the instructions.   The patient was advised to call back or seek an in-person evaluation if the symptoms worsen or if the condition fails to improve as anticipated.  The above assessment and management plan was discussed with the patient. The patient verbalized understanding of and has agreed to the management plan. Patient is aware to call the clinic if they develop any new symptoms or if symptoms persist or worsen. Patient is aware when to return to the clinic for a follow-up visit. Patient educated on when it is appropriate to go to the emergency department.    I provided 15 minutes of non-face-to-face time during this encounter. The call  started at 1320. The call ended at 1335 The other time was used for coordination of care.  Discussed case with Dr. Livia Snellen and states ok to send in medications. Pt called back at 1515 and aware prescription was called in to pharmacy.   Monia Pouch, FNP-C [facility] [address] [place], [place] [address] [phone] 12/31/2020

## 2021-01-12 ENCOUNTER — Other Ambulatory Visit: Payer: Self-pay | Admitting: Family Medicine

## 2021-02-01 ENCOUNTER — Other Ambulatory Visit: Payer: Self-pay

## 2021-02-01 ENCOUNTER — Ambulatory Visit (INDEPENDENT_AMBULATORY_CARE_PROVIDER_SITE_OTHER): Payer: Medicare HMO | Admitting: Family Medicine

## 2021-02-01 ENCOUNTER — Encounter: Payer: Self-pay | Admitting: Family Medicine

## 2021-02-01 VITALS — BP 98/69 | HR 104 | Temp 97.8°F | Ht 69.0 in | Wt 160.8 lb

## 2021-02-01 DIAGNOSIS — E119 Type 2 diabetes mellitus without complications: Secondary | ICD-10-CM | POA: Diagnosis not present

## 2021-02-01 DIAGNOSIS — E782 Mixed hyperlipidemia: Secondary | ICD-10-CM | POA: Diagnosis not present

## 2021-02-01 DIAGNOSIS — Z23 Encounter for immunization: Secondary | ICD-10-CM

## 2021-02-01 LAB — BAYER DCA HB A1C WAIVED: HB A1C (BAYER DCA - WAIVED): 5.3 % (ref 4.8–5.6)

## 2021-02-01 MED ORDER — FLUOXETINE HCL 20 MG PO CAPS: 20.0000 mg | ORAL_CAPSULE | Freq: Every day | ORAL | 3 refills | Status: DC

## 2021-02-01 MED ORDER — SPIRONOLACTONE 25 MG PO TABS: 25.0000 mg | ORAL_TABLET | Freq: Every day | ORAL | 3 refills | Status: DC

## 2021-02-01 MED ORDER — PANTOPRAZOLE SODIUM 40 MG PO TBEC: 40.0000 mg | DELAYED_RELEASE_TABLET | Freq: Every day | ORAL | 3 refills | Status: DC

## 2021-02-01 NOTE — Progress Notes (Signed)
Subjective:  Patient ID: Spencer Stanley,  male    DOB: 09/18/1960  Age: 60 y.o.    CC: Medical Management of Chronic Issues   HPI Spencer Stanley presents for  follow-up of elevated cholesterol. Doing well without complaints on current medication. Denies side effects  including myalgia and arthralgia and nausea. Also in today for liver function testing. Currently no chest pain, shortness of breath or other cardiovascular related symptoms noted.  Follow-up of diabetes. Patient does check blood sugar at home. Readings run between 80 and 130 Patient denies symptoms such as excessive hunger or urinary frequency, excessive hunger, nausea No significant hypoglycemic spells noted. Medications reviewed. Pt reports taking them regularly. Pt. denies complication/adverse reaction today.    History Spencer Stanley has a past medical history of Anxiety, Cirrhosis ([place]), Cirrhosis of liver ([place]), Diabetes mellitus without complication ([place]), Esophageal varices ([place]), High cholesterol, Lyme disease, and [facility] spotted fever.   He has a past surgical history that includes Hernia repair and Esophageal varice ligation.   His family history includes Aneurysm in his mother; Cancer in his father and mother; Dementia in his father; Diabetes in his sister; Heart failure in his father.He reports that he has been smoking cigarettes. He has a 16.50 pack-year smoking history. He quit smokeless tobacco use about 41 years ago.  His smokeless tobacco use included chew. He reports that he does not drink alcohol and does not use drugs.  Current Outpatient Medications on File Prior to Visit  Medication Sig Dispense Refill   Accu-Chek Softclix Lancets lancets TEST BLOOD SUGAR THREE TIMES DAILY Dx E11.9 300 each 3   Alcohol Swabs (DROPSAFE ALCOHOL PREP) 70 % PADS CHECK BLOOD SUGARS THREE TIMES DAILY 300 each 3   atorvastatin (LIPITOR) 10 MG tablet TAKE 1 TABLET EVERY DAY 90 tablet 1   Blood Glucose Calibration (ACCU-CHEK AVIVA)  SOLN Use with Accu-Chek meter 1 each 0   CALCIUM-MAGNESIUM-ZINC PO Take 1 capsule by mouth 2 (two) times daily.     finasteride (PROSCAR) 5 MG tablet Take 1 tablet (5 mg total) by mouth daily. 90 tablet 3   glucose blood (ACCU-CHEK AVIVA PLUS) test strip CHECK BLOOD SUGAR THREE TIMES DAILY Dx E11.9 300 strip 3   lactulose (CHRONULAC) 10 GM/15ML solution Take 45 g by mouth 3 (three) times daily.     minocycline (MINOCIN) 100 MG capsule TAKE 1 CAPSULE TWICE DAILY 180 capsule 0   Multiple Vitamins-Minerals (ZINC PO) Take by mouth.     multivitamin (ONE-A-DAY MEN'S) TABS tablet Take 1 tablet by mouth daily.     rifaximin (XIFAXAN) 550 MG TABS tablet Take 1 tablet (550 mg total) by mouth 2 (two) times daily. 60 tablet 5   sildenafil (REVATIO) 20 MG tablet Take 2-5 pills at once, orally, with each sexual encounter 20 tablet 0   tamsulosin (FLOMAX) 0.4 MG CAPS capsule Take 1 capsule (0.4 mg total) by mouth daily after supper. 90 capsule 3   zinc sulfate 220 (50 Zn) MG capsule Take 1 capsule by mouth daily.     No current facility-administered medications on file prior to visit.    ROS Review of Systems  Constitutional:  Negative for fever.  Respiratory:  Negative for shortness of breath.   Cardiovascular:  Negative for chest pain.  Musculoskeletal:  Negative for arthralgias.  Skin:  Negative for rash.   Objective:  BP 98/69   Pulse (!) 104   Temp 97.8 F (36.6 C)   Ht '5\' 9"'  (1.753 m)  Wt 160 lb 12.8 oz (72.9 kg)   SpO2 99%   BMI 23.75 kg/m   BP Readings from Last 3 Encounters:  02/01/21 98/69  11/01/20 (!) 82/51  10/18/20 (!) 74/44    Wt Readings from Last 3 Encounters:  02/01/21 160 lb 12.8 oz (72.9 kg)  11/01/20 163 lb 12.8 oz (74.3 kg)  10/18/20 167 lb 6.4 oz (75.9 kg)     Physical Exam Vitals reviewed.  Constitutional:      Appearance: He is well-developed.  HENT:     Head: Normocephalic and atraumatic.     Right Ear: External ear normal.     Left Ear: External  ear normal.     Mouth/Throat:     Pharynx: No oropharyngeal exudate or posterior oropharyngeal erythema.  Eyes:     Pupils: Pupils are equal, round, and reactive to light.  Cardiovascular:     Rate and Rhythm: Normal rate and regular rhythm.     Heart sounds: No murmur heard. Pulmonary:     Effort: No respiratory distress.     Breath sounds: Normal breath sounds.  Musculoskeletal:     Cervical back: Normal range of motion and neck supple.  Neurological:     Mental Status: He is alert and oriented to person, place, and time.    Diabetic Foot Exam - Simple   Simple Foot Form Diabetic Foot exam was performed with the following findings: Yes 02/01/2021  2:23 PM  Visual Inspection No deformities, no ulcerations, no other skin breakdown bilaterally: Yes Sensation Testing Intact to touch and monofilament testing bilaterally: Yes Pulse Check Posterior Tibialis and Dorsalis pulse intact bilaterally: Yes Comments     Results for orders placed or performed in visit on 02/01/21  Bayer DCA Hb A1c Waived  Result Value Ref Range   HB A1C (BAYER DCA - WAIVED) 5.3 4.8 - 5.6 %     Assessment & Plan:   Spencer Stanley was seen today for medical management of chronic issues.  Diagnoses and all orders for this visit:  Type 2 diabetes mellitus without complication, without long-term current use of insulin (HCC) -     Bayer DCA Hb A1c Waived -     CBC with Differential/Platelet -     CMP14+EGFR  Mixed hyperlipidemia -     Lipid panel  Need for immunization against influenza -     Flu Vaccine QUAD 50moIM (Fluarix, Fluzone & Alfiuria Quad PF)  Other orders -     FLUoxetine (PROZAC) 20 MG capsule; Take 1 capsule (20 mg total) by mouth daily. -     pantoprazole (PROTONIX) 40 MG tablet; Take 1 tablet (40 mg total) by mouth daily. -     spironolactone (ALDACTONE) 25 MG tablet; Take 1 tablet (25 mg total) by mouth daily.  I have discontinued DEoghan BelchermetFORMIN. I have also changed his  FLUoxetine, pantoprazole, and spironolactone. Additionally, I am having him maintain his multivitamin, CALCIUM-MAGNESIUM-ZINC PO, rifaximin, Multiple Vitamins-Minerals (ZINC PO), lactulose, Accu-Chek Aviva, minocycline, zinc sulfate, Accu-Chek Aviva Plus, finasteride, tamsulosin, atorvastatin, sildenafil, DropSafe Alcohol Prep, and Accu-Chek Softclix Lancets.  Meds ordered this encounter  Medications   FLUoxetine (PROZAC) 20 MG capsule    Sig: Take 1 capsule (20 mg total) by mouth daily.    Dispense:  90 capsule    Refill:  3   pantoprazole (PROTONIX) 40 MG tablet    Sig: Take 1 tablet (40 mg total) by mouth daily.    Dispense:  90 tablet    Refill:  3   spironolactone (ALDACTONE) 25 MG tablet    Sig: Take 1 tablet (25 mg total) by mouth daily.    Dispense:  90 tablet    Refill:  3      Follow-up: Return in about 6 months (around 08/01/2021).  Claretta Fraise, M.D.

## 2021-02-02 LAB — CMP14+EGFR
ALT: 18 IU/L (ref 0–44)
AST: 26 IU/L (ref 0–40)
Albumin/Globulin Ratio: 1.7 (ref 1.2–2.2)
Albumin: 3.9 g/dL (ref 3.8–4.8)
Alkaline Phosphatase: 109 IU/L (ref 44–121)
BUN/Creatinine Ratio: 13 (ref 10–24)
BUN: 10 mg/dL (ref 8–27)
Bilirubin Total: 0.7 mg/dL (ref 0.0–1.2)
CO2: 18 mmol/L — ABNORMAL LOW (ref 20–29)
Calcium: 8.9 mg/dL (ref 8.6–10.2)
Chloride: 108 mmol/L — ABNORMAL HIGH (ref 96–106)
Creatinine, Ser: 0.8 mg/dL (ref 0.76–1.27)
Globulin, Total: 2.3 g/dL (ref 1.5–4.5)
Glucose: 151 mg/dL — ABNORMAL HIGH (ref 65–99)
Potassium: 4.2 mmol/L (ref 3.5–5.2)
Sodium: 143 mmol/L (ref 134–144)
Total Protein: 6.2 g/dL (ref 6.0–8.5)
eGFR: 101 mL/min/{1.73_m2} (ref >59)

## 2021-02-02 LAB — LIPID PANEL
Chol/HDL Ratio: 2.8 ratio (ref 0.0–5.0)
Cholesterol, Total: 122 mg/dL (ref 100–199)
HDL: 44 mg/dL (ref >39)
LDL Chol Calc (NIH): 59 mg/dL (ref 0–99)
Triglycerides: 99 mg/dL (ref 0–149)
VLDL Cholesterol Cal: 19 mg/dL (ref 5–40)

## 2021-02-02 LAB — CBC WITH DIFFERENTIAL/PLATELET
Basophils Absolute: 0 10*3/uL (ref 0.0–0.2)
Basos: 1 %
EOS (ABSOLUTE): 0.1 10*3/uL (ref 0.0–0.4)
Eos: 4 %
Hematocrit: 34.6 % — ABNORMAL LOW (ref 37.5–51.0)
Hemoglobin: 11.5 g/dL — ABNORMAL LOW (ref 13.0–17.7)
Immature Grans (Abs): 0 10*3/uL (ref 0.0–0.1)
Immature Granulocytes: 0 %
Lymphocytes Absolute: 0.7 10*3/uL (ref 0.7–3.1)
Lymphs: 17 %
MCH: 28.7 pg (ref 26.6–33.0)
MCHC: 33.2 g/dL (ref 31.5–35.7)
MCV: 86 fL (ref 79–97)
Monocytes Absolute: 0.4 10*3/uL (ref 0.1–0.9)
Monocytes: 10 %
Neutrophils Absolute: 2.6 10*3/uL (ref 1.4–7.0)
Neutrophils: 68 %
Platelets: 101 10*3/uL — ABNORMAL LOW (ref 150–450)
RBC: 4.01 x10E6/uL — ABNORMAL LOW (ref 4.14–5.80)
RDW: 13.1 % (ref 11.6–15.4)
WBC: 3.8 10*3/uL (ref 3.4–10.8)

## 2021-02-02 NOTE — Progress Notes (Signed)
Hello Spencer Stanley,  Your lab result is normal and/or stable.Some minor variations that are not significant are commonly marked abnormal, but do not represent any medical problem for you.  Best regards, Julyana Woolverton, M.D.

## 2021-02-03 DIAGNOSIS — K7581 Nonalcoholic steatohepatitis (NASH): Secondary | ICD-10-CM | POA: Diagnosis not present

## 2021-02-03 DIAGNOSIS — R161 Splenomegaly, not elsewhere classified: Secondary | ICD-10-CM | POA: Diagnosis not present

## 2021-02-03 DIAGNOSIS — K7460 Unspecified cirrhosis of liver: Secondary | ICD-10-CM | POA: Diagnosis not present

## 2021-02-04 DIAGNOSIS — K7581 Nonalcoholic steatohepatitis (NASH): Secondary | ICD-10-CM | POA: Diagnosis not present

## 2021-02-04 DIAGNOSIS — K7030 Alcoholic cirrhosis of liver without ascites: Secondary | ICD-10-CM | POA: Diagnosis not present

## 2021-02-04 DIAGNOSIS — F1011 Alcohol abuse, in remission: Secondary | ICD-10-CM | POA: Diagnosis not present

## 2021-02-04 DIAGNOSIS — K7460 Unspecified cirrhosis of liver: Secondary | ICD-10-CM | POA: Diagnosis not present

## 2021-02-27 ENCOUNTER — Other Ambulatory Visit: Payer: Self-pay | Admitting: Family Medicine

## 2021-03-17 ENCOUNTER — Ambulatory Visit (INDEPENDENT_AMBULATORY_CARE_PROVIDER_SITE_OTHER): Payer: Medicare HMO

## 2021-03-17 VITALS — Ht 69.0 in | Wt 161.0 lb

## 2021-03-17 DIAGNOSIS — Z0000 Encounter for general adult medical examination without abnormal findings: Secondary | ICD-10-CM | POA: Diagnosis not present

## 2021-03-17 NOTE — Progress Notes (Signed)
Subjective:   Spencer Stanley is a 60 y.o. male who presents for Medicare Annual/Subsequent preventive examination.  Virtual Visit via Telephone Note  I connected with  Spencer Stanley on 03/17/21 at  3:30 PM EDT by telephone and verified that I am speaking with the correct person using two identifiers.  Location: Patient: Home Provider: [facility] Persons participating in the virtual visit: patient/Nurse Health Advisor   I discussed the limitations, risks, security and privacy concerns of performing an evaluation and management service by telephone and the availability of in person appointments. The patient expressed understanding and agreed to proceed.  Interactive audio and video telecommunications were attempted between this nurse and patient, however failed, due to patient having technical difficulties OR patient did not have access to video capability.  We continued and completed visit with audio only.  Some vital signs may be absent or patient reported.   Spencer Stanley E Spencer Wellnitz, LPN   Review of Systems     Cardiac Risk Factors include: advanced age (>38men, >55 women);sedentary lifestyle;smoking/ tobacco exposure;family history of premature cardiovascular disease;diabetes mellitus;dyslipidemia;hypertension;male gender;Other (see comment), Risk factor comments: cirrhosis     Objective:    Today's Vitals   03/17/21 1529  Weight: 161 lb (73 kg)  Height: 5\' 9"  (1.753 m)   Body mass index is 23.78 kg/m.  Advanced Directives 03/17/2021 03/21/2018 03/20/2017 12/08/2015  Does Patient Have a Medical Advance Directive? No No Yes No  Type of Advance Directive - - Living will;Healthcare Power of Attorney -  Does patient want to make changes to medical advance directive? - - No - Patient declined -  Copy of [facility] in Chart? - - No - copy requested -  Would patient like information on creating a medical advance directive? No - Patient declined Yes (MAU/Ambulatory/Procedural Areas -  Information given) - -    Current Medications (verified) Outpatient Encounter Medications as of 03/17/2021  Medication Sig   Accu-Chek Softclix Lancets lancets TEST BLOOD SUGAR THREE TIMES DAILY Dx E11.9   Alcohol Swabs (DROPSAFE ALCOHOL PREP) 70 % PADS CHECK BLOOD SUGARS THREE TIMES DAILY   atorvastatin (LIPITOR) 10 MG tablet TAKE 1 TABLET EVERY DAY   Blood Glucose Calibration (ACCU-CHEK AVIVA) SOLN Use with Accu-Chek meter   CALCIUM-MAGNESIUM-ZINC PO Take 1 capsule by mouth 2 (two) times daily.   FLUoxetine (PROZAC) 20 MG capsule Take 1 capsule (20 mg total) by mouth daily.   glucose blood (ACCU-CHEK AVIVA PLUS) test strip CHECK BLOOD SUGAR THREE TIMES DAILY Dx E11.9   lactulose (CHRONULAC) 10 GM/15ML solution Take 45 g by mouth 3 (three) times daily.   minocycline (MINOCIN) 100 MG capsule TAKE 1 CAPSULE TWICE DAILY   Multiple Vitamins-Minerals (ZINC PO) Take by mouth.   multivitamin (ONE-A-DAY MEN'S) TABS tablet Take 1 tablet by mouth daily.   pantoprazole (PROTONIX) 40 MG tablet Take 1 tablet (40 mg total) by mouth daily.   rifaximin (XIFAXAN) 550 MG TABS tablet Take 1 tablet (550 mg total) by mouth 2 (two) times daily.   sildenafil (REVATIO) 20 MG tablet Take 2-5 pills at once, orally, with each sexual encounter   spironolactone (ALDACTONE) 25 MG tablet Take 1 tablet (25 mg total) by mouth daily.   tamsulosin (FLOMAX) 0.4 MG CAPS capsule Take 1 capsule (0.4 mg total) by mouth daily after supper.   zinc sulfate 220 (50 Zn) MG capsule Take 1 capsule by mouth daily.   finasteride (PROSCAR) 5 MG tablet Take 1 tablet (5 mg total) by mouth daily. (  Patient not taking: Reported on 03/17/2021)   metFORMIN (GLUCOPHAGE-XR) 750 MG 24 hr tablet TAKE 1 TABLET EVERY DAY WITH BREAKFAST (Patient not taking: Reported on 03/17/2021)   No facility-administered encounter medications on file as of 03/17/2021.    Allergies (verified) Other   History: Past Medical History:  Diagnosis Date   Anxiety     Cirrhosis ([place])    Cirrhosis of liver ([place])    Diabetes mellitus without complication ([place])    Esophageal varices (HCC)    High cholesterol    Lyme disease    [place] spotted fever    Past Surgical History:  Procedure Laterality Date   ESOPHAGEAL VARICE LIGATION     TIPS   HERNIA REPAIR     Family History  Problem Relation Age of Onset   Aneurysm Mother    Cancer Mother        breast   Dementia Father    Cancer Father        brain tumor   Heart failure Father    Diabetes Sister    Social History   Socioeconomic History   Marital status: Soil scientist    Spouse name: Not on file   Number of children: 0   Years of education: 16   Highest education level: Bachelor's degree (e.g., BA, AB, BS)  Occupational History   Occupation: disability  Tobacco Use   Smoking status: Every Day    Packs/day: 0.50    Years: 33.00    Pack years: 16.50    Types: Cigarettes   Smokeless tobacco: Former    Types: Chew    Quit date: 1981   Tobacco comments:    03/21/2018 cut back to 1/2 ppd  Vaping Use   Vaping Use: Never used  Substance and Sexual Activity   Alcohol use: No    Comment: no alcohol since 2013   Drug use: No   Sexual activity: Yes  Other Topics Concern   Not on file  Social History Narrative   Not on file   Social Determinants of Health   Financial Resource Strain: Low Risk    Difficulty of Paying Living Expenses: Not very hard  Food Insecurity: No Food Insecurity   Worried About Charity fundraiser in the Last Year: Never true   Ran Out of Food in the Last Year: Never true  Transportation Needs: No Transportation Needs   Lack of Transportation (Medical): No   Lack of Transportation (Non-Medical): No  Physical Activity: Insufficiently Active   Days of Exercise per Week: 7 days   Minutes of Exercise per Session: 20 min  Stress: No Stress Concern Present   Feeling of Stress : Only a little  Social Connections: Moderately Integrated   Frequency of  Communication with Friends and Family: More than three times a week   Frequency of Social Gatherings with Friends and Family: More than three times a week   Attends Religious Services: Never   Marine scientist or Organizations: Yes   Attends Archivist Meetings: 1 to 4 times per year   Marital Status: Living with partner    Tobacco Counseling Ready to quit: Not Answered Counseling given: Not Answered Tobacco comments: 03/21/2018 cut back to 1/2 ppd   Clinical Intake:  Pre-visit preparation completed: Yes  Pain : No/denies pain     BMI - recorded: 23.78 Nutritional Status: BMI of 19-24  Normal Nutritional Risks: None Diabetes: Yes CBG done?: No Did pt. bring in CBG monitor  from home?: No  How often do you need to have someone help you when you read instructions, pamphlets, or other written materials from your doctor or pharmacy?: 1 - Never  Diabetic? Yes Nutrition Risk Assessment:  Has the patient had any N/V/D within the last 2 months?  Yes  he takes lactulose Does the patient have any non-healing wounds?  No  Has the patient had any unintentional weight loss or weight gain?  No   Diabetes:  Is the patient diabetic?  Yes  If diabetic, was a CBG obtained today?  No  Did the patient bring in their glucometer from home?  No  How often do you monitor your CBG's? Once daily - 110 this am per patient.   Financial Strains and Diabetes Management:  Are you having any financial strains with the device, your supplies or your medication? No .  Does the patient want to be seen by Chronic Care Management for management of their diabetes?  No  Would the patient like to be referred to a Nutritionist or for Diabetic Management?  No   Diabetic Exams:  Diabetic Eye Exam: Completed 01/2021.   Diabetic Foot Exam: Completed 02/01/2021. Pt has been advised about the importance in completing this exam. Pt is scheduled for diabetic foot exam on next year.    Interpreter  Needed?: No  Information entered by :: Spencer Pasch, LPN   Activities of Daily Living In your present state of health, do you have any difficulty performing the following activities: 03/17/2021  Hearing? N  Vision? N  Difficulty concentrating or making decisions? Y  Comment only if he forgets lactulose and ammonia levels elevate  Walking or climbing stairs? N  Dressing or bathing? N  Doing errands, shopping? N  Preparing Food and eating ? N  Using the Toilet? N  In the past six months, have you accidently leaked urine? N  Do you have problems with loss of bowel control? N  Managing your Medications? N  Managing your Finances? N  Housekeeping or managing your Housekeeping? N  Some recent data might be hidden    Patient Care Team: Spencer Fraise, MD as PCP - General (Family Medicine) Lenna Sciara, MD as Referring Physician (Gastroenterology) Izora Gala, MD as Consulting Physician (Otolaryngology) Ilean China, RN as Case Manager  Indicate any recent Medical Services you may have received from other than [facility] providers in the past year (date may be approximate).     Assessment:   This is a routine wellness examination for Kairi.  Hearing/Vision screen Hearing Screening - Comments:: Hears better out of left ear, but no concerns of hearing difficulties Vision Screening - Comments:: Wears rx glasses - up to date with annual eye exams with [facility]  Dietary issues and exercise activities discussed: Current Exercise Habits: Home exercise routine, Type of exercise: walking, Time (Minutes): 20, Frequency (Times/Week): 7, Weekly Exercise (Minutes/Week): 140, Intensity: Mild, Exercise limited by: None identified   Goals Addressed               This Visit's Progress     "I would like to quit smoking" (pt-stated)   On track     Current Barriers:  Chronic Disease Management support and education needs related to smoking cessation  Nurse Case Manager Clinical  Goal(s):  Over the next 30 days, patient will demonstrate improved health management independence as evidenced bydecreasing the amount of cigarettes smoked in one day to less than 10.   Interventions:  Discussed  smoking routine Down to 5 cigarettes a day Encouraged to cut back even more and plan out times to smoke in order to decrease the total number of cigarettes in one day Discussed patient's expectations    Patient Self Care Activities:  Performs ADL's independently Performs IADL's independently  Please see past updates related to this goal by clicking on the "Past Updates" button in the selected goal         Depression Screen PHQ 2/9 Scores 03/17/2021 02/01/2021 11/01/2020 11/01/2020 04/29/2020 11/03/2019 05/05/2019  PHQ - 2 Score 0 0 2 0 0 0 0  PHQ- 9 Score - - 8 - - - -    Fall Risk Fall Risk  03/17/2021 02/01/2021 11/01/2020 11/01/2020 04/29/2020  Falls in the past year? 0 0 1 0 1  Comment - - - - -  Number falls in past yr: 0 - 0 - 0  Injury with Fall? 0 - 0 - 0  Risk for fall due to : No Fall Risks - History of fall(s) - -  Follow up Falls prevention discussed - Falls evaluation completed - Falls evaluation completed    [place]:  Any stairs in or around the home? No  If so, are there any without handrails? No  Home free of loose throw rugs in walkways, pet beds, electrical cords, etc? Yes  Adequate lighting in your home to reduce risk of falls? Yes   ASSISTIVE DEVICES UTILIZED TO PREVENT FALLS:  Life alert? No  Use of a cane, walker or w/c? No  Grab bars in the bathroom? Yes  Shower chair or bench in shower? No  Elevated toilet seat or a handicapped toilet? No   TIMED UP AND GO:  Was the test performed? No . Telephonic visit  Cognitive Function: Normal cognitive status assessed by direct observation by this Nurse Health Advisor. No abnormalities found.    MMSE - Mini Mental State Exam 03/20/2017  Orientation to time 5   Orientation to Place 5  Registration 3  Attention/ Calculation 5  Recall 3  Language- name 2 objects 2  Language- repeat 1  Language- follow 3 step command 3  Language- read & follow direction 1  Write a sentence 1  Copy design 1  Total score 30     6CIT Screen 03/17/2021 03/21/2018  What Year? 0 points 0 points  What month? 0 points 0 points  What time? 0 points 0 points  Count back from 20 0 points 0 points  Months in reverse 0 points 0 points  Repeat phrase 0 points 0 points  Total Score 0 0    Immunizations Immunization History  Administered Date(s) Administered   Hepatitis B, adult 04/26/2018   Influenza,inj,Quad PF,6+ Mos 06/19/2014, 03/26/2015, 02/25/2016, 03/20/2017, 03/21/2018, 03/04/2019, 04/29/2020, 02/01/2021   PFIZER(Purple Top)SARS-COV-2 Vaccination 08/01/2019, 08/26/2019, 04/10/2020   Pneumococcal Conjugate-13 11/03/2019   Pneumococcal Polysaccharide-23 03/20/2017    TDAP status: Up to date  Flu Vaccine status: Up to date  Pneumococcal vaccine status: Up to date  Covid-19 vaccine status: Completed vaccines  Qualifies for Shingles Vaccine? Yes   Zostavax completed No   Shingrix Completed?: No.    Education has been provided regarding the importance of this vaccine. Patient has been advised to call insurance company to determine out of pocket expense if they have not yet received this vaccine. Advised may also receive vaccine at local pharmacy or Health Dept. Verbalized acceptance and understanding.  Screening Tests Health Maintenance  Topic  Date Due   OPHTHALMOLOGY EXAM  11/27/2019   COVID-19 Vaccine (4 - Booster for Pfizer series) 06/05/2020   Zoster Vaccines- Shingrix (1 of 2) 05/03/2021 (Originally 11/08/1979)   HEMOGLOBIN A1C  08/01/2021   URINE MICROALBUMIN  11/01/2021   FOOT EXAM  02/01/2022   Pneumococcal Vaccine 43-68 Years old (3 - PPSV23 if available, else PCV20) 03/20/2022   TETANUS/TDAP  08/24/2022   COLONOSCOPY (Pts 45-94yrs Insurance  coverage will need to be confirmed)  09/22/2025   INFLUENZA VACCINE  Completed   Hepatitis C Screening  Completed   HIV Screening  Completed   HPV VACCINES  Aged Out    Health Maintenance  Health Maintenance Due  Topic Date Due   OPHTHALMOLOGY EXAM  11/27/2019   COVID-19 Vaccine (4 - Booster for [place] series) 06/05/2020    Colorectal cancer screening: Type of screening: Colonoscopy. Completed 09/22/2020. Repeat every 5 years  Lung Cancer Screening: (Low Dose CT Chest recommended if Age 12-80 years, 30 pack-year currently smoking OR have quit w/in 15years.) does qualify.   Lung Cancer Screening Referral: last dopne 06/14/20 - has these every year  Additional Screening:  Hepatitis C Screening: does qualify; Completed 11/03/2019  Vision Screening: Recommended annual ophthalmology exams for early detection of glaucoma and other disorders of the eye. Is the patient up to date with their annual eye exam?  Yes  Who is the provider or what is the name of the office in which the patient attends annual eye exams? [facility] If pt is not established with a provider, would they like to be referred to a provider to establish care? No .   Dental Screening: Recommended annual dental exams for proper oral hygiene  Community Resource Referral / Chronic Care Management: CRR required this visit?  No   CCM required this visit?  No      Plan:     I have personally reviewed and noted the following in the patient's chart:   Medical and social history Use of alcohol, tobacco or illicit drugs  Current medications and supplements including opioid prescriptions. Patient is not currently taking opioid prescriptions. Functional ability and status Nutritional status Physical activity Advanced directives List of other physicians Hospitalizations, surgeries, and ER visits in previous 12 months Vitals Screenings to include cognitive, depression, and falls Referrals and appointments  In  addition, I have reviewed and discussed with patient certain preventive protocols, quality metrics, and best practice recommendations. A written personalized care plan for preventive services as well as general preventive health recommendations were provided to patient.     Sandrea Hammond, LPN   [id number]   Nurse Notes: None

## 2021-03-17 NOTE — Patient Instructions (Signed)
Spencer Stanley , Thank you for taking time to come for your Medicare Wellness Visit. I appreciate your ongoing commitment to your health goals. Please review the following plan we discussed and let me know if I can assist you in the future.   Screening recommendations/referrals: Colonoscopy: Done 09/22/2020 - Repeat in 5 years Recommended yearly ophthalmology/optometry visit for glaucoma screening and checkup Recommended yearly dental visit for hygiene and checkup  Vaccinations: Influenza vaccine: Done 02/01/2021 - Repeat annually  Pneumococcal vaccine: Done 03/20/2017 & 11/03/2019 Tdap vaccine: Done 08/23/2012 - Repeat in 10 years  Shingles vaccine: Due   Covid-19: Done 08/01/2019, 08/26/2019, 04/10/2020, & 12/2020  Advanced directives: Advance directive discussed with you today. Even though you declined this today, please call our office should you change your mind, and we can give you the proper paperwork for you to fill out.   Conditions/risks identified: Aim for 30 minutes of exercise or brisk walking each day, drink 6-8 glasses of water and eat lots of fruits and vegetables.   Next appointment: Follow up in one year for your annual wellness visit   Preventive Care 40-64 Years, Male Preventive care refers to lifestyle choices and visits with your health care provider that can promote health and wellness. What does preventive care include? A yearly physical exam. This is also called an annual well check. Dental exams once or twice a year. Routine eye exams. Ask your health care provider how often you should have your eyes checked. Personal lifestyle choices, including: Daily care of your teeth and gums. Regular physical activity. Eating a healthy diet. Avoiding tobacco and drug use. Limiting alcohol use. Practicing safe sex. Taking low-dose aspirin every day starting at age 11. What happens during an annual well check? The services and screenings done by your health care provider during  your annual well check will depend on your age, overall health, lifestyle risk factors, and family history of disease. Counseling  Your health care provider may ask you questions about your: Alcohol use. Tobacco use. Drug use. Emotional well-being. Home and relationship well-being. Sexual activity. Eating habits. Work and work Statistician. Screening  You may have the following tests or measurements: Height, weight, and BMI. Blood pressure. Lipid and cholesterol levels. These may be checked every 5 years, or more frequently if you are over 58 years old. Skin check. Lung cancer screening. You may have this screening every year starting at age 20 if you have a 30-pack-year history of smoking and currently smoke or have quit within the past 15 years. Fecal occult blood test (FOBT) of the stool. You may have this test every year starting at age 22. Flexible sigmoidoscopy or colonoscopy. You may have a sigmoidoscopy every 5 years or a colonoscopy every 10 years starting at age 39. Prostate cancer screening. Recommendations will vary depending on your family history and other risks. Hepatitis C blood test. Hepatitis B blood test. Sexually transmitted disease (STD) testing. Diabetes screening. This is done by checking your blood sugar (glucose) after you have not eaten for a while (fasting). You may have this done every 1-3 years. Discuss your test results, treatment options, and if necessary, the need for more tests with your health care provider. Vaccines  Your health care provider may recommend certain vaccines, such as: Influenza vaccine. This is recommended every year. Tetanus, diphtheria, and acellular pertussis (Tdap, Td) vaccine. You may need a Td booster every 10 years. Zoster vaccine. You may need this after age 21. Pneumococcal 13-valent conjugate (PCV13) vaccine. You  may need this if you have certain conditions and have not been vaccinated. Pneumococcal polysaccharide (PPSV23)  vaccine. You may need one or two doses if you smoke cigarettes or if you have certain conditions. Talk to your health care provider about which screenings and vaccines you need and how often you need them. This information is not intended to replace advice given to you by your health care provider. Make sure you discuss any questions you have with your health care provider. Document Released: 05/28/2015 Document Revised: 01/19/2016 Document Reviewed: 03/02/2015 Elsevier Interactive Patient Education  2017 [employer] Prevention in the Home Falls can cause injuries. They can happen to people of all ages. There are many things you can do to make your home safe and to help prevent falls. What can I do on the outside of my home? Regularly fix the edges of walkways and driveways and fix any cracks. Remove anything that might make you trip as you walk through a door, such as a raised step or threshold. Trim any bushes or trees on the path to your home. Use bright outdoor lighting. Clear any walking paths of anything that might make someone trip, such as rocks or tools. Regularly check to see if handrails are loose or broken. Make sure that both sides of any steps have handrails. Any raised decks and porches should have guardrails on the edges. Have any leaves, snow, or ice cleared regularly. Use sand or salt on walking paths during winter. Clean up any spills in your garage right away. This includes oil or grease spills. What can I do in the bathroom? Use night lights. Install grab bars by the toilet and in the tub and shower. Do not use towel bars as grab bars. Use non-skid mats or decals in the tub or shower. If you need to sit down in the shower, use a plastic, non-slip stool. Keep the floor dry. Clean up any water that spills on the floor as soon as it happens. Remove soap buildup in the tub or shower regularly. Attach bath mats securely with double-sided non-slip rug tape. Do not  have throw rugs and other things on the floor that can make you trip. What can I do in the bedroom? Use night lights. Make sure that you have a light by your bed that is easy to reach. Do not use any sheets or blankets that are too big for your bed. They should not hang down onto the floor. Have a firm chair that has side arms. You can use this for support while you get dressed. Do not have throw rugs and other things on the floor that can make you trip. What can I do in the kitchen? Clean up any spills right away. Avoid walking on wet floors. Keep items that you use a lot in easy-to-reach places. If you need to reach something above you, use a strong step stool that has a grab bar. Keep electrical cords out of the way. Do not use floor polish or wax that makes floors slippery. If you must use wax, use non-skid floor wax. Do not have throw rugs and other things on the floor that can make you trip. What can I do with my stairs? Do not leave any items on the stairs. Make sure that there are handrails on both sides of the stairs and use them. Fix handrails that are broken or loose. Make sure that handrails are as long as the stairways. Check any carpeting to make  sure that it is firmly attached to the stairs. Fix any carpet that is loose or worn. Avoid having throw rugs at the top or bottom of the stairs. If you do have throw rugs, attach them to the floor with carpet tape. Make sure that you have a light switch at the top of the stairs and the bottom of the stairs. If you do not have them, ask someone to add them for you. What else can I do to help prevent falls? Wear shoes that: Do not have high heels. Have rubber bottoms. Are comfortable and fit you well. Are closed at the toe. Do not wear sandals. If you use a stepladder: Make sure that it is fully opened. Do not climb a closed stepladder. Make sure that both sides of the stepladder are locked into place. Ask someone to hold it for  you, if possible. Clearly mark and make sure that you can see: Any grab bars or handrails. First and last steps. Where the edge of each step is. Use tools that help you move around (mobility aids) if they are needed. These include: Canes. Walkers. Scooters. Crutches. Turn on the lights when you go into a dark area. Replace any light bulbs as soon as they burn out. Set up your furniture so you have a clear path. Avoid moving your furniture around. If any of your floors are uneven, fix them. If there are any pets around you, be aware of where they are. Review your medicines with your doctor. Some medicines can make you feel dizzy. This can increase your chance of falling. Ask your doctor what other things that you can do to help prevent falls. This information is not intended to replace advice given to you by your health care provider. Make sure you discuss any questions you have with your health care provider. Document Released: 02/25/2009 Document Revised: 10/07/2015 Document Reviewed: 06/05/2014 Elsevier Interactive Patient Education  2017 [employer].

## 2021-03-18 ENCOUNTER — Encounter: Payer: Self-pay | Admitting: *Deleted

## 2021-03-31 DIAGNOSIS — Z0100 Encounter for examination of eyes and vision without abnormal findings: Secondary | ICD-10-CM | POA: Diagnosis not present

## 2021-04-18 ENCOUNTER — Encounter: Payer: Self-pay | Admitting: Urology

## 2021-04-18 ENCOUNTER — Other Ambulatory Visit: Payer: Self-pay

## 2021-04-18 ENCOUNTER — Ambulatory Visit: Payer: Medicare HMO | Admitting: Urology

## 2021-04-18 VITALS — BP 140/83 | HR 77 | Wt 161.0 lb

## 2021-04-18 DIAGNOSIS — N401 Enlarged prostate with lower urinary tract symptoms: Secondary | ICD-10-CM | POA: Diagnosis not present

## 2021-04-18 DIAGNOSIS — N138 Other obstructive and reflux uropathy: Secondary | ICD-10-CM | POA: Diagnosis not present

## 2021-04-18 DIAGNOSIS — R3911 Hesitancy of micturition: Secondary | ICD-10-CM

## 2021-04-18 DIAGNOSIS — N201 Calculus of ureter: Secondary | ICD-10-CM | POA: Diagnosis not present

## 2021-04-18 LAB — URINALYSIS, ROUTINE W REFLEX MICROSCOPIC
Bilirubin, UA: NEGATIVE
Glucose, UA: NEGATIVE
Ketones, UA: NEGATIVE
Nitrite, UA: NEGATIVE
Protein,UA: NEGATIVE
RBC, UA: NEGATIVE
Specific Gravity, UA: 1.015 (ref 1.005–1.030)
Urobilinogen, Ur: 2 mg/dL — ABNORMAL HIGH (ref 0.2–1.0)
pH, UA: 7 (ref 5.0–7.5)

## 2021-04-18 LAB — MICROSCOPIC EXAMINATION
Bacteria, UA: NONE SEEN
RBC, Urine: NONE SEEN /hpf (ref 0–2)
Renal Epithel, UA: NONE SEEN /hpf

## 2021-04-18 LAB — BLADDER SCAN AMB NON-IMAGING: Scan Result: 31

## 2021-04-18 NOTE — Progress Notes (Signed)
04/18/2021 10:20 AM   Spencer Stanley 1960-07-13 [id number]  Referring provider: Claretta Fraise, MD [place],  [place] [address]  No chief complaint on file.   HPI: F/u -   1) gross hematuria-patient noted red urine Nov and Dec 2021. No clots. UA 12/21 showed greater than 30 red blood cells per high-powered field, urine cx no growth. He underwent an ultrasound 01/22 which was benign, possible 5 mm LLP (echogenic focus no shadowing). He is a smoker.  Cr 0.78. CT and cystoscopy benign 03/22. 06/22 renal US benign. He had one flash of blood in the urine again. UA with mod bac and 3-10 rbc. Rx'd 5ari June 2022, but didn't want to start.    2) BPH-prostate was 58 g on 01/22 renal/bladder ultrasound with some bladder wall thickening/trabeculation and about 50 g on CT 03/22. PSA 0.9 12/20. He has a weak stream, urgency and urge UI. No pad use. PVR is 13 ml. He started tamsulosin 02/22. Not much change in urgency. AUASS = 17, dissatisfied. Cysto 03/22, with lateral lobes - good 5ari, OAB med or PUL candidate. He continues to have some hesitancy. Rx'd 5ari June 2022 but pt declined due to risks of ED.    3) left proximal ureteral stone - possible 4-5 mm left proximal stone on CT 03/22. No hydro or other stones. Vascular calcifications also present. 06/22 renal US with no stone or hydro. KUB ordered not done. No flank pain.    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 His BP is kept low as he's had varices rupture. He stopped drinking. 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    Today, seen for the above. PVR 31 ml. AUASS = 16. On tamsulosin. No gross hematuria.    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         Medication List        Accurate as of April 18, 2021 10:20 AM. If you have any questions, ask your nurse or doctor.          Accu-Chek Aviva Plus test strip Generic drug: glucose blood CHECK BLOOD SUGAR THREE TIMES DAILY Dx E11.9   Accu-Chek Aviva Soln Use with Accu-Chek meter   Accu-Chek Softclix Lancets lancets TEST BLOOD SUGAR THREE TIMES DAILY Dx E11.9   atorvastatin 10 MG tablet Commonly known as: LIPITOR TAKE 1 TABLET EVERY DAY   CALCIUM-MAGNESIUM-ZINC PO Take 1 capsule by mouth 2 (two) times daily.   DropSafe Alcohol Prep 70 % Pads CHECK BLOOD SUGARS THREE TIMES DAILY   finasteride 5 MG tablet Commonly known as: Proscar Take 1 tablet (5 mg total) by mouth daily.   FLUoxetine 20 MG capsule Commonly known as: PROZAC Take 1 capsule (20 mg total) by mouth daily.   lactulose  10 GM/15ML solution Commonly known as: CHRONULAC Take 45 g by mouth 3 (three) times daily.   metFORMIN 750 MG 24 hr tablet Commonly known as: GLUCOPHAGE-XR TAKE 1 TABLET EVERY DAY WITH BREAKFAST   minocycline 100 MG capsule Commonly known as: MINOCIN TAKE 1 CAPSULE TWICE DAILY   multivitamin Tabs tablet Take 1 tablet by mouth daily.   pantoprazole 40 MG tablet Commonly known as: PROTONIX Take 1 tablet (40 mg total) by mouth daily.   rifaximin 550 MG Tabs tablet Commonly known as: XIFAXAN Take 1 tablet (550 mg total) by mouth 2 (two) times daily.   sildenafil 20 MG tablet Commonly known as: REVATIO Take 2-5 pills at once, orally, with each sexual encounter   spironolactone 25 MG tablet Commonly known as: ALDACTONE Take 1 tablet (25 mg total) by mouth  daily.   tamsulosin 0.4 MG Caps capsule Commonly known as: FLOMAX Take 1 capsule (0.4 mg total) by mouth daily after supper.   ZINC PO Take by mouth.   zinc sulfate 220 (50 Zn) MG capsule Take 1 capsule by mouth daily.        Allergies:  Allergies  Allergen Reactions   Other Other (See Comments)    Other reaction(s): Mental Status Changes (intolerance) After medicated induced coma patient seeing things that weren't there.     Family History: Family History  Problem Relation Age of Onset   Aneurysm Mother    Cancer Mother        breast   Dementia Father    Cancer Father        brain tumor   Heart failure Father    Diabetes Sister     Social History:  reports that he has been smoking cigarettes. He has a 16.50 pack-year smoking history. He quit smokeless tobacco use about 41 years ago.  His smokeless tobacco use included chew. He reports that he does not drink alcohol and does not use drugs.   Physical Exam: There were no vitals taken for this visit.  Constitutional:  Alert and oriented, No acute distress. HEENT: [place] AT, moist mucus membranes.  Trachea midline, no masses. Cardiovascular: No clubbing, cyanosis, or edema. Respiratory: Normal respiratory effort, no increased work of breathing. GI: Abdomen is soft, nontender, nondistended, no abdominal masses GU: No CVA tenderness Skin: No rashes, bruises or suspicious lesions. Neurologic: Grossly intact, no focal deficits, moving all 4 extremities. Psychiatric: Normal mood and affect.  Laboratory Data: Lab Results  Component Value Date   WBC 3.8 02/01/2021   HGB 11.5 (L) 02/01/2021   HCT 34.6 (L) 02/01/2021   MCV 86 02/01/2021   PLT 101 (L) 02/01/2021    Lab Results  Component Value Date   CREATININE 0.80 02/01/2021    No results found for: PSA  Lab Results  Component Value Date   TESTOSTERONE 913 06/20/2014    Lab Results  Component Value Date   HGBA1C 5.3 02/01/2021    Urinalysis     Component Value Date/Time   APPEARANCEUR Clear 10/18/2020 1011   GLUCOSEU 2+ (A) 10/18/2020 1011   BILIRUBINUR Negative 10/18/2020 1011   PROTEINUR Trace (A) 10/18/2020 1011   NITRITE Negative 10/18/2020 1011   LEUKOCYTESUR 1+ (A) 10/18/2020 1011    Lab Results  Component Value Date   LABMICR 176.5 11/01/2020   WBCUA 6-10 (A) 10/18/2020   LABEPIT 0-10 10/18/2020   MUCUS Present 07/19/2020   BACTERIA Moderate (A) 10/18/2020     Results for orders placed during the hospital encounter of  10/14/20  US RENAL  Narrative CLINICAL DATA:  Left ureteral stone.  EXAM: RENAL / URINARY TRACT ULTRASOUND COMPLETE  COMPARISON:  CT abdomen and pelvis 07/16/2020. Renal ultrasound 05/18/2020.  FINDINGS: Right Kidney:  Renal measurements: 11.6 x 6.0 x 5.9 cm = volume: 214 mL. Echogenicity within normal limits. No mass or hydronephrosis visualized.  Left Kidney:  Renal measurements: 11.3 x 5.2 x 6.0 cm = volume: 185 mL. Echogenicity within normal limits. No mass or hydronephrosis visualized.  Bladder:  Mild circumferential bladder wall thickening which may be at least in part due to underdistension.  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  IMPRESSION: 1. Unremarkable sonographic appearance of the kidneys. 2. Mild circumferential bladder wall thickening which may be at least in part due to underdistension although cystitis is also possible. 3. Splenomegaly.   Electronically Signed By: Logan Bores M.D. On: 10/14/2020 13:51  No results found for this or any previous visit.  No results found for this or any previous visit.  No results found for this or any previous visit.   Assessment & Plan:    1. BPH with obstruction/lower urinary tract symptoms Discussed again nature r/b/a to 5ari and he will continue tamsulosin ONLY.  - Urinalysis, Routine w reflex microscopic - BLADDER SCAN AMB NON-IMAGING  2. Urinary  hesitancy PSA was sent. See in 6 mo for DRE and then yearly.     No follow-ups on file.  Festus Aloe, MD  [facility]  [address] [place], [place] [address] [phone]

## 2021-04-18 NOTE — Progress Notes (Signed)
post void residual =31  Urological Symptom Review  Patient is experiencing the following symptoms: Frequent urination Hard to postpone urination Get up at night to urinate Trouble starting stream Weak stream Erection problems (male only)   Review of Systems  Gastrointestinal (upper)  : Negative for upper GI symptoms  Gastrointestinal (lower) : Negative for lower GI symptoms  Constitutional : Negative for symptoms  Skin: Negative for skin symptoms  Eyes: Negative for eye symptoms  Ear/Nose/Throat : Negative for Ear/Nose/Throat symptoms  Hematologic/Lymphatic: Negative for Hematologic/Lymphatic symptoms  Cardiovascular : Negative for cardiovascular symptoms  Respiratory : Negative for respiratory symptoms  Endocrine: Negative for endocrine symptoms  Musculoskeletal: Negative for musculoskeletal symptoms  Neurological: Negative for neurological symptoms  Psychologic: Depression

## 2021-04-19 LAB — PSA, TOTAL AND FREE
PSA, Free Pct: 45.4 %
PSA, Free: 0.59 ng/mL
Prostate Specific Ag, Serum: 1.3 ng/mL (ref 0.0–4.0)

## 2021-05-02 ENCOUNTER — Ambulatory Visit: Payer: Medicare HMO | Admitting: Family Medicine

## 2021-05-03 ENCOUNTER — Ambulatory Visit: Payer: Medicare HMO | Admitting: Family Medicine

## 2021-05-04 ENCOUNTER — Encounter: Payer: Self-pay | Admitting: Family Medicine

## 2021-05-04 ENCOUNTER — Ambulatory Visit (INDEPENDENT_AMBULATORY_CARE_PROVIDER_SITE_OTHER): Payer: Medicare HMO | Admitting: Family Medicine

## 2021-05-04 VITALS — BP 111/60 | HR 94 | Temp 98.7°F | Ht 69.0 in | Wt 175.4 lb

## 2021-05-04 DIAGNOSIS — E119 Type 2 diabetes mellitus without complications: Secondary | ICD-10-CM | POA: Diagnosis not present

## 2021-05-04 DIAGNOSIS — E782 Mixed hyperlipidemia: Secondary | ICD-10-CM | POA: Diagnosis not present

## 2021-05-04 MED ORDER — METFORMIN HCL ER 750 MG PO TB24: ORAL_TABLET | ORAL | 3 refills | Status: DC

## 2021-05-04 NOTE — Progress Notes (Signed)
Subjective:  Patient ID: Spencer Stanley,  male    DOB: 23-Sep-1960  Age: 60 y.o.    CC: Medical Management of Chronic Issues   HPI Laithan Conchas presents for  follow-up of hypertension. Patient has no history of headache chest pain or shortness of breath or recent cough. Patient also denies symptoms of TIA such as numbness weakness lateralizing. Patient denies side effects from medication. States taking it regularly.  Patient also  in for follow-up of elevated cholesterol. Doing well without complaints on current medication. Denies side effects  including myalgia and arthralgia and nausea. Also in today for liver function testing. Currently no chest pain, shortness of breath or other cardiovascular related symptoms noted.  Follow-up of diabetes. Patient has not been checking glucose. Patient denies symptoms such as excessive hunger or urinary frequency, excessive hunger, nausea No significant hypoglycemic spells noted. Medications reviewed. Pt reports taking them regularly. Pt. denies complication/adverse reaction today.    History Lewie has a past medical history of Anxiety, Cirrhosis ([place]), Cirrhosis of liver ([place]), Diabetes mellitus without complication ([place]), Esophageal varices ([place]), High cholesterol, Lyme disease, and [facility] spotted fever.   He has a past surgical history that includes Hernia repair and Esophageal varice ligation.   His family history includes Aneurysm in his mother; Cancer in his father and mother; Dementia in his father; Diabetes in his sister; Heart failure in his father.He reports that he has been smoking cigarettes. He has a 16.50 pack-year smoking history. He quit smokeless tobacco use about 41 years ago.  His smokeless tobacco use included chew. He reports that he does not drink alcohol and does not use drugs.  Current Outpatient Medications on File Prior to Visit  Medication Sig Dispense Refill   Accu-Chek Softclix Lancets lancets TEST BLOOD SUGAR THREE  TIMES DAILY Dx E11.9 300 each 3   Alcohol Swabs (DROPSAFE ALCOHOL PREP) 70 % PADS CHECK BLOOD SUGARS THREE TIMES DAILY 300 each 3   atorvastatin (LIPITOR) 10 MG tablet TAKE 1 TABLET EVERY DAY 90 tablet 1   Blood Glucose Calibration (ACCU-CHEK AVIVA) SOLN Use with Accu-Chek meter 1 each 0   CALCIUM-MAGNESIUM-ZINC PO Take 1 capsule by mouth 2 (two) times daily.     finasteride (PROSCAR) 5 MG tablet Take 1 tablet (5 mg total) by mouth daily. 90 tablet 3   FLUoxetine (PROZAC) 20 MG capsule Take 1 capsule (20 mg total) by mouth daily. 90 capsule 3   glucose blood (ACCU-CHEK AVIVA PLUS) test strip CHECK BLOOD SUGAR THREE TIMES DAILY Dx E11.9 300 strip 3   lactulose (CHRONULAC) 10 GM/15ML solution Take 45 g by mouth 3 (three) times daily.     minocycline (MINOCIN) 100 MG capsule TAKE 1 CAPSULE TWICE DAILY 180 capsule 0   Multiple Vitamins-Minerals (ZINC PO) Take by mouth.     multivitamin (ONE-A-DAY MEN'S) TABS tablet Take 1 tablet by mouth daily.     pantoprazole (PROTONIX) 40 MG tablet Take 1 tablet (40 mg total) by mouth daily. 90 tablet 3   rifaximin (XIFAXAN) 550 MG TABS tablet Take 1 tablet (550 mg total) by mouth 2 (two) times daily. 60 tablet 5   sildenafil (REVATIO) 20 MG tablet Take 2-5 pills at once, orally, with each sexual encounter 20 tablet 0   spironolactone (ALDACTONE) 25 MG tablet Take 1 tablet (25 mg total) by mouth daily. 90 tablet 3   tamsulosin (FLOMAX) 0.4 MG CAPS capsule Take 1 capsule (0.4 mg total) by mouth daily after supper. 90 capsule 3  zinc sulfate 220 (50 Zn) MG capsule Take 1 capsule by mouth daily.     No current facility-administered medications on file prior to visit.    ROS Review of Systems  Constitutional:  Negative for fever.  Respiratory:  Negative for shortness of breath.   Cardiovascular:  Negative for chest pain.  Musculoskeletal:  Negative for arthralgias.  Skin:  Negative for rash.   Objective:  BP 111/60    Pulse 94    Temp 98.7 F (37.1 C)     Ht '5\' 9"'  (1.753 m)    Wt 175 lb 6.4 oz (79.6 kg)    SpO2 99%    BMI 25.90 kg/m   BP Readings from Last 3 Encounters:  05/04/21 111/60  04/18/21 140/83  02/01/21 98/69    Wt Readings from Last 3 Encounters:  05/04/21 175 lb 6.4 oz (79.6 kg)  04/18/21 161 lb (73 kg)  03/17/21 161 lb (73 kg)     Physical Exam Constitutional:      General: He is not in acute distress.    Appearance: He is well-developed.  HENT:     Head: Normocephalic and atraumatic.     Right Ear: External ear normal.     Left Ear: External ear normal.     Nose: Nose normal.  Eyes:     Conjunctiva/sclera: Conjunctivae normal.     Pupils: Pupils are equal, round, and reactive to light.  Cardiovascular:     Rate and Rhythm: Normal rate and regular rhythm.     Heart sounds: Normal heart sounds. No murmur heard. Pulmonary:     Effort: Pulmonary effort is normal. No respiratory distress.     Breath sounds: Normal breath sounds. No wheezing or rales.  Abdominal:     Palpations: Abdomen is soft.     Tenderness: There is no abdominal tenderness.  Musculoskeletal:        General: Normal range of motion.     Cervical back: Normal range of motion and neck supple.  Skin:    General: Skin is warm and dry.  Neurological:     Mental Status: He is alert and oriented to person, place, and time.     Deep Tendon Reflexes: Reflexes are normal and symmetric.  Psychiatric:        Behavior: Behavior normal.        Thought Content: Thought content normal.        Judgment: Judgment normal.    Diabetic Foot Exam - Simple   No data filed       Assessment & Plan:   Chey was seen today for medical management of chronic issues.  Diagnoses and all orders for this visit:  Type 2 diabetes mellitus without complication, without long-term current use of insulin (HCC) -     Bayer DCA Hb A1c Waived -     CBC with Differential/Platelet -     CMP14+EGFR  Mixed hyperlipidemia -     Lipid panel  Other orders -      metFORMIN (GLUCOPHAGE-XR) 750 MG 24 hr tablet; TAKE 1 TABLET EVERY DAY WITH BREAKFAST   I am having [name] maintain his multivitamin, CALCIUM-MAGNESIUM-ZINC PO, rifaximin, Multiple Vitamins-Minerals (ZINC PO), lactulose, Accu-Chek Aviva, minocycline, zinc sulfate, Accu-Chek Aviva Plus, finasteride, tamsulosin, atorvastatin, sildenafil, DropSafe Alcohol Prep, Accu-Chek Softclix Lancets, FLUoxetine, pantoprazole, spironolactone, and metFORMIN.  Meds ordered this encounter  Medications   metFORMIN (GLUCOPHAGE-XR) 750 MG 24 hr tablet    Sig: TAKE 1 TABLET EVERY DAY WITH BREAKFAST  Dispense:  90 tablet    Refill:  3     Follow-up: Return in about 3 months (around 08/02/2021).  Claretta Fraise, M.D.

## 2021-05-05 LAB — CBC WITH DIFFERENTIAL/PLATELET
Basophils Absolute: 0.1 10*3/uL (ref 0.0–0.2)
Basos: 2 %
EOS (ABSOLUTE): 0.2 10*3/uL (ref 0.0–0.4)
Eos: 5 %
Hematocrit: 35.3 % — ABNORMAL LOW (ref 37.5–51.0)
Hemoglobin: 11.6 g/dL — ABNORMAL LOW (ref 13.0–17.7)
Immature Grans (Abs): 0 10*3/uL (ref 0.0–0.1)
Immature Granulocytes: 0 %
Lymphocytes Absolute: 1.1 10*3/uL (ref 0.7–3.1)
Lymphs: 23 %
MCH: 25.8 pg — ABNORMAL LOW (ref 26.6–33.0)
MCHC: 32.9 g/dL (ref 31.5–35.7)
MCV: 79 fL (ref 79–97)
Monocytes Absolute: 0.6 10*3/uL (ref 0.1–0.9)
Monocytes: 12 %
Neutrophils Absolute: 2.8 10*3/uL (ref 1.4–7.0)
Neutrophils: 58 %
Platelets: 88 10*3/uL — CL (ref 150–450)
RBC: 4.49 x10E6/uL (ref 4.14–5.80)
RDW: 15.8 % — ABNORMAL HIGH (ref 11.6–15.4)
WBC: 4.8 10*3/uL (ref 3.4–10.8)

## 2021-05-05 LAB — CMP14+EGFR
ALT: 22 IU/L (ref 0–44)
AST: 28 IU/L (ref 0–40)
Albumin/Globulin Ratio: 1.5 (ref 1.2–2.2)
Albumin: 3.8 g/dL (ref 3.8–4.8)
Alkaline Phosphatase: 109 IU/L (ref 44–121)
BUN/Creatinine Ratio: 11 (ref 10–24)
BUN: 11 mg/dL (ref 8–27)
Bilirubin Total: 0.6 mg/dL (ref 0.0–1.2)
CO2: 21 mmol/L (ref 20–29)
Calcium: 9.8 mg/dL (ref 8.6–10.2)
Chloride: 106 mmol/L (ref 96–106)
Creatinine, Ser: 1.02 mg/dL (ref 0.76–1.27)
Globulin, Total: 2.6 g/dL (ref 1.5–4.5)
Glucose: 123 mg/dL — ABNORMAL HIGH (ref 70–99)
Potassium: 4.4 mmol/L (ref 3.5–5.2)
Sodium: 140 mmol/L (ref 134–144)
Total Protein: 6.4 g/dL (ref 6.0–8.5)
eGFR: 84 mL/min/{1.73_m2} (ref >59)

## 2021-05-05 LAB — LIPID PANEL
Chol/HDL Ratio: 2.8 ratio (ref 0.0–5.0)
Cholesterol, Total: 107 mg/dL (ref 100–199)
HDL: 38 mg/dL — ABNORMAL LOW (ref >39)
LDL Chol Calc (NIH): 54 mg/dL (ref 0–99)
Triglycerides: 71 mg/dL (ref 0–149)
VLDL Cholesterol Cal: 15 mg/dL (ref 5–40)

## 2021-05-05 LAB — BAYER DCA HB A1C WAIVED: HB A1C (BAYER DCA - WAIVED): 6.8 % — ABNORMAL HIGH (ref 4.8–5.6)

## 2021-06-22 ENCOUNTER — Other Ambulatory Visit (HOSPITAL_COMMUNITY): Payer: Self-pay

## 2021-06-22 DIAGNOSIS — Z87891 Personal history of nicotine dependence: Secondary | ICD-10-CM

## 2021-06-22 DIAGNOSIS — Z122 Encounter for screening for malignant neoplasm of respiratory organs: Secondary | ICD-10-CM

## 2021-06-22 NOTE — Progress Notes (Signed)
Order placed for LDCT per protocol. LDCT scheduled for 03/21 at 0900.

## 2021-06-29 ENCOUNTER — Other Ambulatory Visit: Payer: Self-pay | Admitting: Family Medicine

## 2021-08-02 ENCOUNTER — Other Ambulatory Visit: Payer: Self-pay

## 2021-08-02 ENCOUNTER — Ambulatory Visit (HOSPITAL_COMMUNITY)
Admission: RE | Admit: 2021-08-02 | Discharge: 2021-08-02 | Disposition: A | Discharge status: Home / Self Care | Payer: Medicare HMO | Source: Ambulatory Visit | Attending: Physician Assistant | Admitting: Physician Assistant

## 2021-08-02 DIAGNOSIS — Z122 Encounter for screening for malignant neoplasm of respiratory organs: Secondary | ICD-10-CM | POA: Insufficient documentation

## 2021-08-02 DIAGNOSIS — Z87891 Personal history of nicotine dependence: Secondary | ICD-10-CM | POA: Insufficient documentation

## 2021-08-02 DIAGNOSIS — F17210 Nicotine dependence, cigarettes, uncomplicated: Secondary | ICD-10-CM | POA: Diagnosis not present

## 2021-08-03 ENCOUNTER — Ambulatory Visit (INDEPENDENT_AMBULATORY_CARE_PROVIDER_SITE_OTHER): Payer: Medicare HMO | Admitting: Family Medicine

## 2021-08-03 ENCOUNTER — Telehealth: Payer: Self-pay | Admitting: Family Medicine

## 2021-08-03 ENCOUNTER — Other Ambulatory Visit: Payer: Self-pay | Admitting: *Deleted

## 2021-08-03 ENCOUNTER — Encounter: Payer: Self-pay | Admitting: Family Medicine

## 2021-08-03 VITALS — BP 100/61 | HR 97 | Temp 97.9°F | Ht 69.0 in | Wt 176.4 lb

## 2021-08-03 DIAGNOSIS — E119 Type 2 diabetes mellitus without complications: Secondary | ICD-10-CM

## 2021-08-03 DIAGNOSIS — Z23 Encounter for immunization: Secondary | ICD-10-CM | POA: Diagnosis not present

## 2021-08-03 DIAGNOSIS — Z125 Encounter for screening for malignant neoplasm of prostate: Secondary | ICD-10-CM | POA: Diagnosis not present

## 2021-08-03 DIAGNOSIS — E782 Mixed hyperlipidemia: Secondary | ICD-10-CM | POA: Diagnosis not present

## 2021-08-03 DIAGNOSIS — E291 Testicular hypofunction: Secondary | ICD-10-CM | POA: Diagnosis not present

## 2021-08-03 DIAGNOSIS — K7030 Alcoholic cirrhosis of liver without ascites: Secondary | ICD-10-CM | POA: Diagnosis not present

## 2021-08-03 LAB — BAYER DCA HB A1C WAIVED: HB A1C (BAYER DCA - WAIVED): 6.5 % — ABNORMAL HIGH (ref 4.8–5.6)

## 2021-08-03 NOTE — Progress Notes (Signed)
? ?Subjective:  ?Patient ID: Spencer Stanley, male    DOB: [birth year]  Age: 61 y.o. MRN: [id number] ? ?CC: Medical Management of Chronic Issues ? ? ?HPI ?Spencer Stanley presents forFollow-up of diabetes. Patient checks blood sugar at home.  ? 80-120 fasting and 120-150 postprandial ?Patient denies symptoms such as polyuria, polydipsia, excessive hunger, nausea ?No significant hypoglycemic spells noted. ?Medications reviewed. Pt reports taking them regularly without complication/adverse reaction being reported today.  ? ? ?History ?Spencer Stanley has a past medical history of Anxiety, Cirrhosis ([place]), Cirrhosis of liver ([place]), Diabetes mellitus without complication ([place]), Esophageal varices ([place]), High cholesterol, Lyme disease, and [facility] spotted fever.  ? ?Spencer Stanley has a past surgical history that includes Hernia repair and Esophageal varice ligation.  ? ?His family history includes Aneurysm in his mother; Cancer in his father and mother; Dementia in his father; Diabetes in his sister; Heart failure in his father.Spencer Stanley reports that Spencer Stanley has been smoking cigarettes. Spencer Stanley has a 16.50 pack-year smoking history. Spencer Stanley quit smokeless tobacco use about 42 years ago.  His smokeless tobacco use included chew. Spencer Stanley reports that Spencer Stanley does not drink alcohol and does not use drugs. ? ?Current Outpatient Medications on File Prior to Visit  ?Medication Sig Dispense Refill  ? Accu-Chek Softclix Lancets lancets TEST BLOOD SUGAR THREE TIMES DAILY Dx E11.9 300 each 3  ? Alcohol Swabs (DROPSAFE ALCOHOL PREP) 70 % PADS CHECK BLOOD SUGARS THREE TIMES DAILY 300 each 3  ? atorvastatin (LIPITOR) 10 MG tablet TAKE 1 TABLET EVERY DAY 90 tablet 1  ? Blood Glucose Calibration (ACCU-CHEK AVIVA) SOLN Use with Accu-Chek meter 1 each 0  ? CALCIUM-MAGNESIUM-ZINC PO Take 1 capsule by mouth 2 (two) times daily.    ? finasteride (PROSCAR) 5 MG tablet Take 1 tablet (5 mg total) by mouth daily. 90 tablet 3  ? FLUoxetine (PROZAC) 20 MG capsule Take 1 capsule (20 mg total) by mouth  daily. 90 capsule 3  ? glucose blood (ACCU-CHEK AVIVA PLUS) test strip CHECK BLOOD SUGAR THREE TIMES DAILY Dx E11.9 300 strip 3  ? lactulose (CHRONULAC) 10 GM/15ML solution Take 45 g by mouth 3 (three) times daily.    ? metFORMIN (GLUCOPHAGE-XR) 750 MG 24 hr tablet TAKE 1 TABLET EVERY DAY WITH BREAKFAST 90 tablet 3  ? minocycline (MINOCIN) 100 MG capsule TAKE 1 CAPSULE TWICE DAILY 180 capsule 0  ? Multiple Vitamins-Minerals (ZINC PO) Take by mouth.    ? multivitamin (ONE-A-DAY MEN'S) TABS tablet Take 1 tablet by mouth daily.    ? pantoprazole (PROTONIX) 40 MG tablet Take 1 tablet (40 mg total) by mouth daily. 90 tablet 3  ? rifaximin (XIFAXAN) 550 MG TABS tablet Take 1 tablet (550 mg total) by mouth 2 (two) times daily. 60 tablet 5  ? sildenafil (REVATIO) 20 MG tablet Take 2-5 pills at once, orally, with each sexual encounter 20 tablet 0  ? spironolactone (ALDACTONE) 25 MG tablet Take 1 tablet (25 mg total) by mouth daily. 90 tablet 3  ? tamsulosin (FLOMAX) 0.4 MG CAPS capsule Take 1 capsule (0.4 mg total) by mouth daily after supper. 90 capsule 3  ? zinc sulfate 220 (50 Zn) MG capsule Take 1 capsule by mouth daily.    ? ?No current facility-administered medications on file prior to visit.  ? ? ?ROS ?Review of Systems  ?Constitutional:  Negative for fever.  ?Respiratory:  Negative for shortness of breath.   ?Cardiovascular:  Negative for chest pain.  ?Musculoskeletal:  Negative for arthralgias.  ?Skin:  Negative for rash.  ? ?Objective:  ?BP 100/61   Pulse 97   Temp 97.9 ?F (36.6 ?C)   Ht 5' 9" (1.753 m)   Wt 176 lb 6.4 oz (80 kg)   SpO2 99%   BMI 26.05 kg/m?  ? ?BP Readings from Last 3 Encounters:  ?08/03/21 100/61  ?05/04/21 111/60  ?04/18/21 140/83  ? ? ?Wt Readings from Last 3 Encounters:  ?08/03/21 176 lb 6.4 oz (80 kg)  ?05/04/21 175 lb 6.4 oz (79.6 kg)  ?04/18/21 161 lb (73 kg)  ? ? ? ?Physical Exam ?Vitals reviewed.  ?Constitutional:   ?   General: Spencer Stanley is not in acute distress. ?   Appearance: Spencer Stanley is  well-developed.  ?HENT:  ?   Head: Normocephalic and atraumatic.  ?   Right Ear: External ear normal.  ?   Left Ear: External ear normal.  ?   Nose: Nose normal.  ?   Mouth/Throat:  ?   Pharynx: No oropharyngeal exudate or posterior oropharyngeal erythema.  ?Eyes:  ?   Conjunctiva/sclera: Conjunctivae normal.  ?   Pupils: Pupils are equal, round, and reactive to light.  ?Cardiovascular:  ?   Rate and Rhythm: Normal rate and regular rhythm.  ?   Heart sounds: Normal heart sounds. No murmur heard. ?Pulmonary:  ?   Effort: Pulmonary effort is normal. No respiratory distress.  ?   Breath sounds: Normal breath sounds. No wheezing or rales.  ?Abdominal:  ?   Palpations: Abdomen is soft.  ?   Tenderness: There is no abdominal tenderness.  ?Musculoskeletal:     ?   General: Normal range of motion.  ?   Cervical back: Normal range of motion and neck supple.  ?Skin: ?   General: Skin is warm and dry.  ?Neurological:  ?   Mental Status: Spencer Stanley is alert and oriented to person, place, and time.  ?   Deep Tendon Reflexes: Reflexes are normal and symmetric.  ?Psychiatric:     ?   Behavior: Behavior normal.     ?   Thought Content: Thought content normal.     ?   Judgment: Judgment normal.  ? ? ? ? ?Assessment & Plan:  ? ?Spencer Stanley was seen today for medical management of chronic issues. ? ?Diagnoses and all orders for this visit: ? ?Mixed hyperlipidemia ?-     Lipid panel ?-     PSA Total (Reflex To Free) ? ?Type 2 diabetes mellitus without complication, without long-term current use of insulin ([place]) ?-     CMP14+EGFR ?-     CBC with Differential/Platelet ?-     Bayer DCA Hb A1c Waived ?-     PSA Total (Reflex To Free) ? ?Alcoholic cirrhosis of liver without ascites (HCC) ?-     PSA Total (Reflex To Free) ?-     Ammonia ? ?Need for shingles vaccine ?-     Varicella-zoster vaccine IM (Shingrix) ? ?Other orders ?-     Cancel: Varicella-zoster vaccine subcutaneous ? ? ? ? ? ?I am having Spencer Stanley maintain his multivitamin,  CALCIUM-MAGNESIUM-ZINC PO, rifaximin, Multiple Vitamins-Minerals (ZINC PO), lactulose, Accu-Chek Aviva, minocycline, zinc sulfate, Accu-Chek Aviva Plus, finasteride, tamsulosin, sildenafil, DropSafe Alcohol Prep, Accu-Chek Softclix Lancets, FLUoxetine, pantoprazole, spironolactone, metFORMIN, and atorvastatin. ? ?No orders of the defined types were placed in this encounter. ? ? ? ?Follow-up: Return in about 3 months (around 11/03/2021). ? ?Claretta Fraise, M.D. ? ?

## 2021-08-03 NOTE — Telephone Encounter (Signed)
Patient has already came in and lab orders were done. ?

## 2021-08-04 LAB — CBC WITH DIFFERENTIAL/PLATELET
Basophils Absolute: 0.1 10*3/uL (ref 0.0–0.2)
Basos: 1 %
EOS (ABSOLUTE): 0.2 10*3/uL (ref 0.0–0.4)
Eos: 5 %
Hematocrit: 34.3 % — ABNORMAL LOW (ref 37.5–51.0)
Hemoglobin: 11 g/dL — ABNORMAL LOW (ref 13.0–17.7)
Immature Grans (Abs): 0 10*3/uL (ref 0.0–0.1)
Immature Granulocytes: 0 %
Lymphocytes Absolute: 1.2 10*3/uL (ref 0.7–3.1)
Lymphs: 26 %
MCH: 25.5 pg — ABNORMAL LOW (ref 26.6–33.0)
MCHC: 32.1 g/dL (ref 31.5–35.7)
MCV: 80 fL (ref 79–97)
Monocytes Absolute: 0.6 10*3/uL (ref 0.1–0.9)
Monocytes: 12 %
Neutrophils Absolute: 2.5 10*3/uL (ref 1.4–7.0)
Neutrophils: 56 %
Platelets: 84 10*3/uL — CL (ref 150–450)
RBC: 4.31 x10E6/uL (ref 4.14–5.80)
RDW: 15.8 % — ABNORMAL HIGH (ref 11.6–15.4)
WBC: 4.6 10*3/uL (ref 3.4–10.8)

## 2021-08-04 LAB — LIPID PANEL
Chol/HDL Ratio: 3.4 ratio (ref 0.0–5.0)
Cholesterol, Total: 129 mg/dL (ref 100–199)
HDL: 38 mg/dL — ABNORMAL LOW (ref >39)
LDL Chol Calc (NIH): 57 mg/dL (ref 0–99)
Triglycerides: 207 mg/dL — ABNORMAL HIGH (ref 0–149)
VLDL Cholesterol Cal: 34 mg/dL (ref 5–40)

## 2021-08-04 LAB — CMP14+EGFR
ALT: 17 IU/L (ref 0–44)
AST: 25 IU/L (ref 0–40)
Albumin/Globulin Ratio: 1.4 (ref 1.2–2.2)
Albumin: 3.8 g/dL (ref 3.8–4.8)
Alkaline Phosphatase: 122 IU/L — ABNORMAL HIGH (ref 44–121)
BUN/Creatinine Ratio: 9 — ABNORMAL LOW (ref 10–24)
BUN: 9 mg/dL (ref 8–27)
Bilirubin Total: 0.5 mg/dL (ref 0.0–1.2)
CO2: 22 mmol/L (ref 20–29)
Calcium: 9.5 mg/dL (ref 8.6–10.2)
Chloride: 106 mmol/L (ref 96–106)
Creatinine, Ser: 0.96 mg/dL (ref 0.76–1.27)
Globulin, Total: 2.7 g/dL (ref 1.5–4.5)
Glucose: 103 mg/dL — ABNORMAL HIGH (ref 70–99)
Potassium: 4.3 mmol/L (ref 3.5–5.2)
Sodium: 141 mmol/L (ref 134–144)
Total Protein: 6.5 g/dL (ref 6.0–8.5)
eGFR: 90 mL/min/{1.73_m2} (ref >59)

## 2021-08-04 LAB — AMMONIA: Ammonia: 78 ug/dL (ref 40–200)

## 2021-08-04 LAB — PSA TOTAL (REFLEX TO FREE): Prostate Specific Ag, Serum: 1.1 ng/mL (ref 0.0–4.0)

## 2021-08-08 NOTE — Progress Notes (Signed)
Hello Spencer Stanley,  Your lab result is normal and/or stable.Some minor variations that are not significant are commonly marked abnormal, but do not represent any medical problem for you.  Best regards, Kristina Bertone, M.D.

## 2021-08-30 ENCOUNTER — Other Ambulatory Visit: Payer: Self-pay | Admitting: Family Medicine

## 2021-09-04 IMAGING — US US RENAL
1 series · 14 of 25 positions shown · non-contrast
Comparison: None.

CLINICAL DATA: Gross hematuria, tobacco abuse, diabetes

EXAM:
RENAL / URINARY TRACT ULTRASOUND COMPLETE

[Series 1: us renal · 14 of 47 slices shown]
[im 1/47]
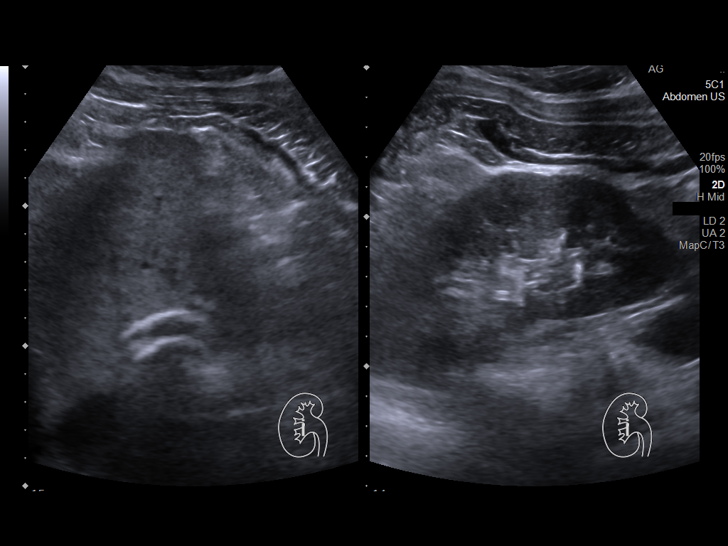
[im 4/47]
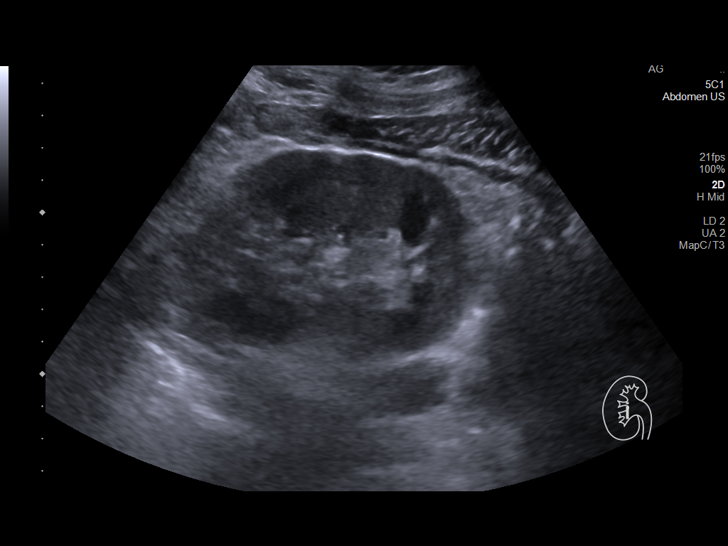
[im 8/47]
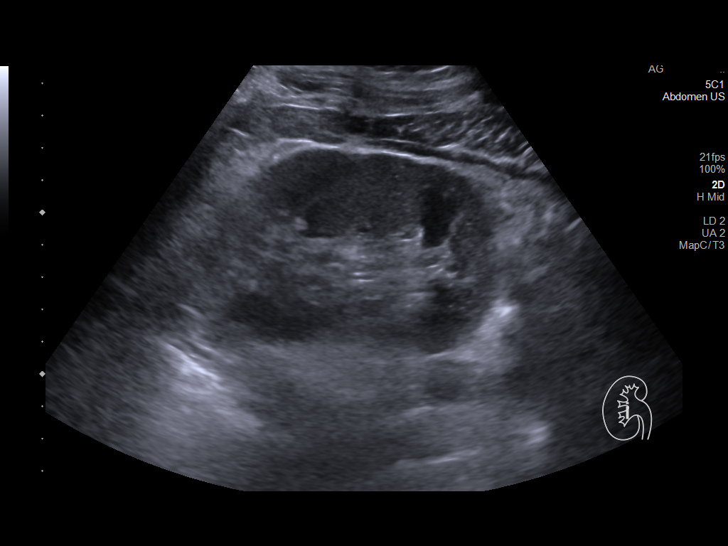
[im 12/47]
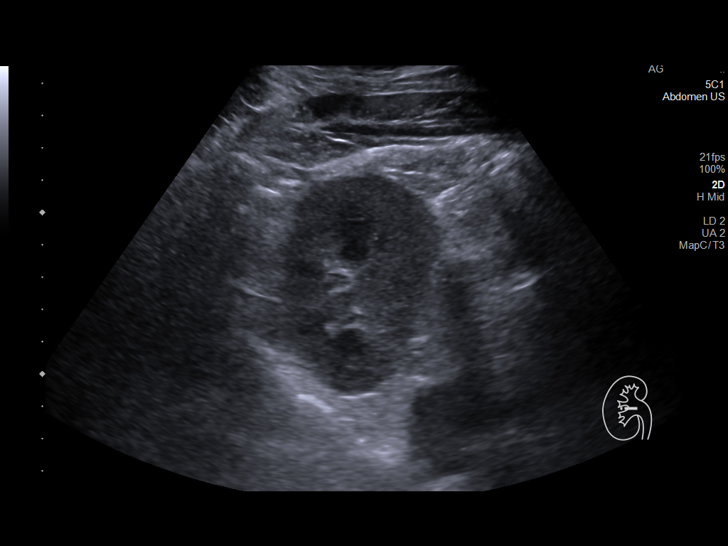
[im 16/47]
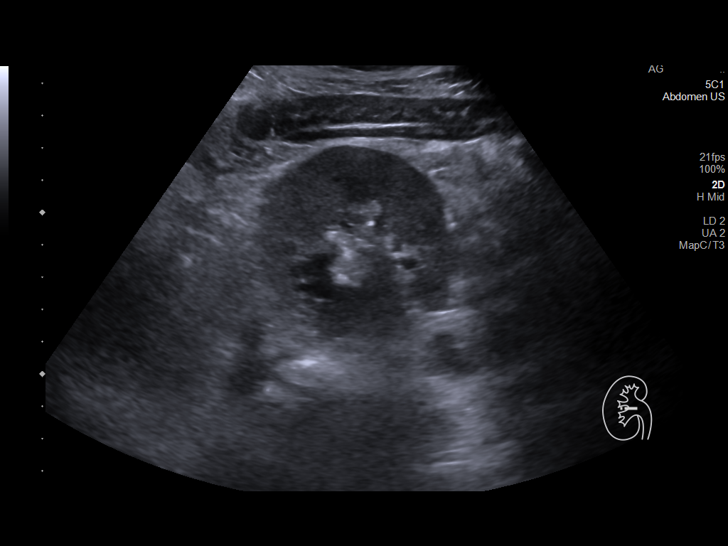
[im 18/47]
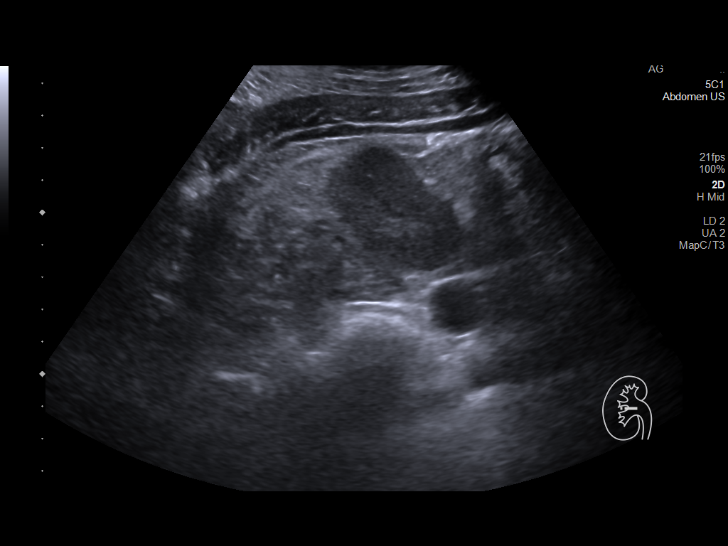
[im 22/47]
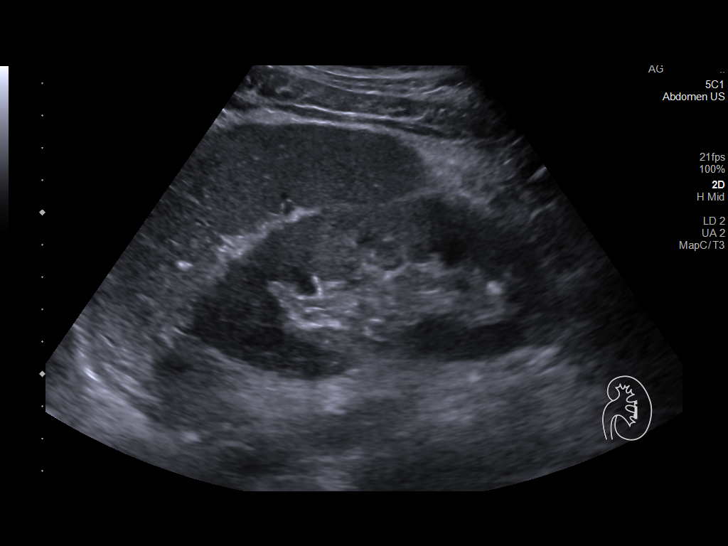
[im 25/47]
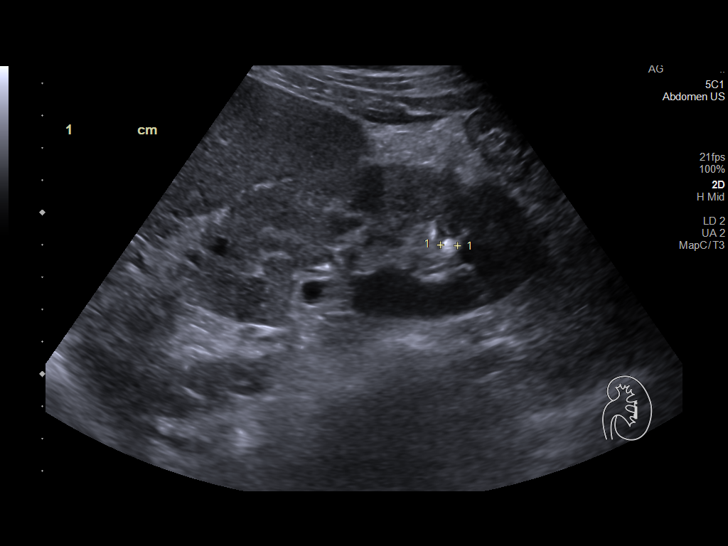
[im 29/47]
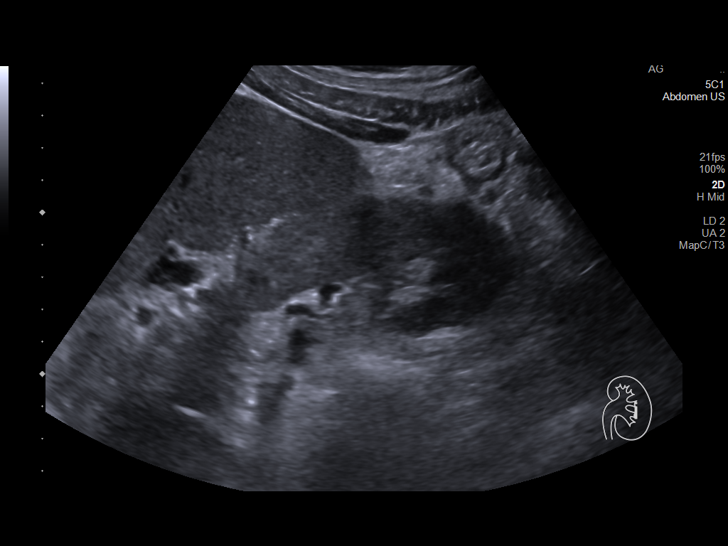
[im 31/47]
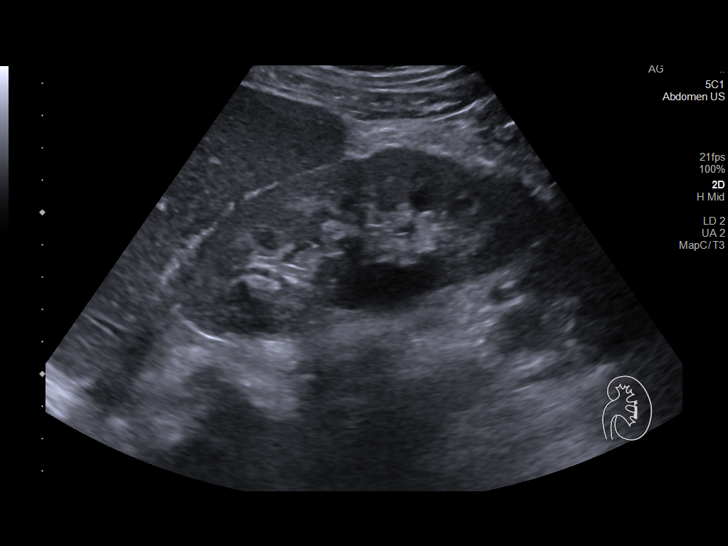
[im 35/47]
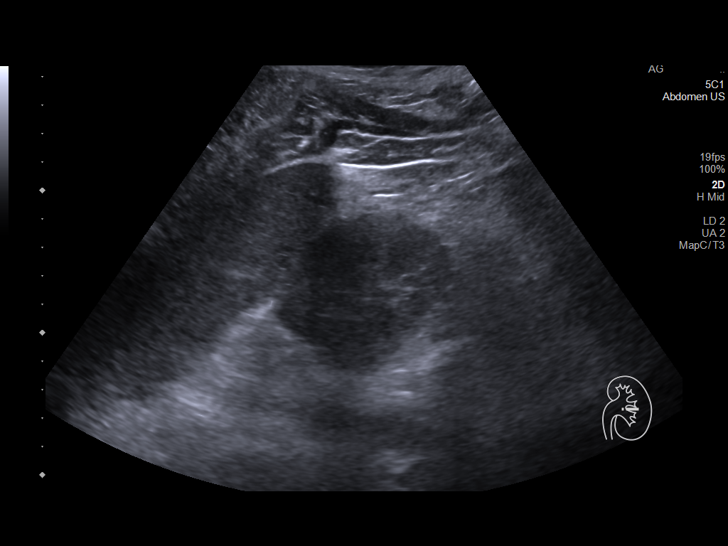
[im 39/47]
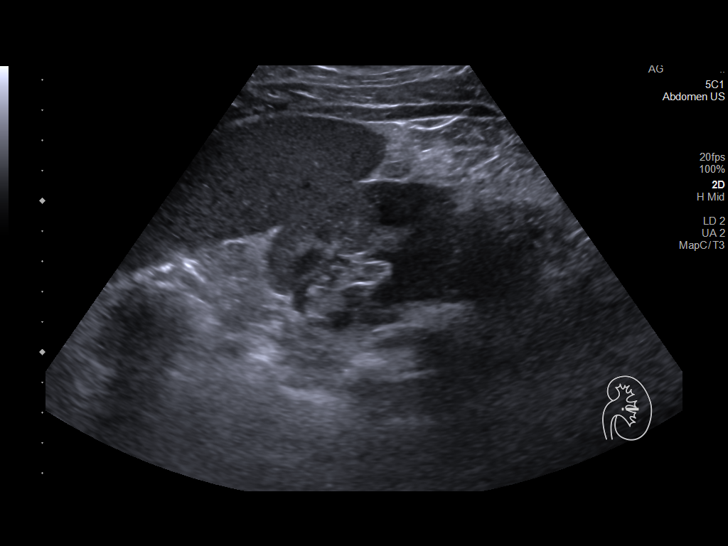
[im 43/47]
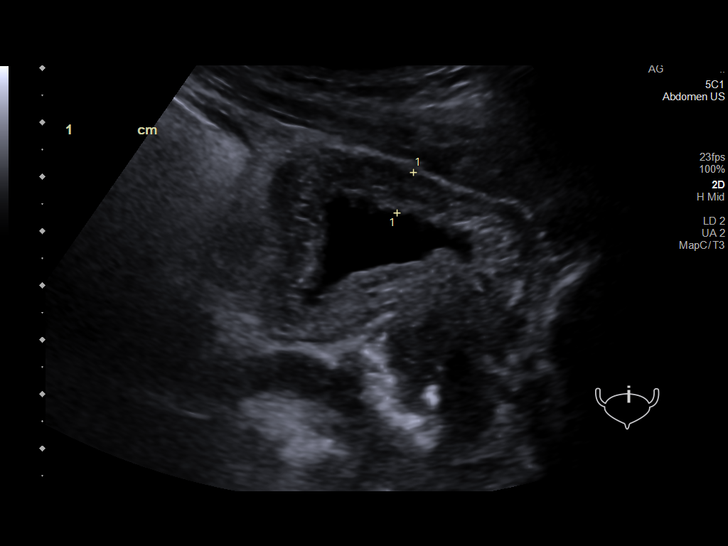
[im 47/47]
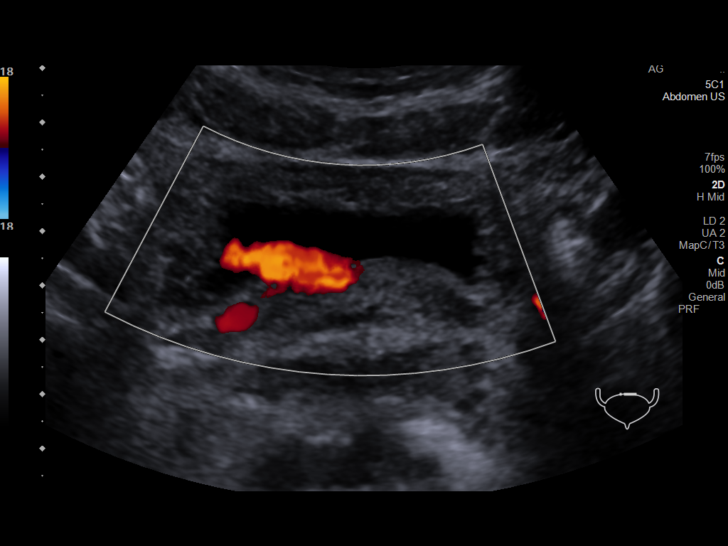

[14 of 25 positions shown; findings below may reference images not displayed]

FINDINGS: Right Kidney:

Renal measurements: 9.3 x 5.8 by 5.2 cm = volume: 147 mL .
Echogenicity within normal limits. No mass or hydronephrosis
visualized.

Left Kidney:

Renal measurements: 11.4 x 5.1 by 6.2 cm = volume: 189 mL.
Echogenicity within normal limits. No mass or hydronephrosis
visualized. 5 mm nonobstructing calculus lower pole left kidney.

Bladder:

Bladder is incompletely distended which limits its evaluation.
Bladder wall thickening likely related to under distension.

Other:

None.
IMPRESSION: 1. Nonobstructing 5 mm left renal calculus.
2. Otherwise unremarkable appearance of the kidneys.
3. Nondistended urinary bladder limiting evaluation.

## 2021-10-01 ENCOUNTER — Other Ambulatory Visit: Payer: Self-pay | Admitting: Family Medicine

## 2021-10-31 ENCOUNTER — Ambulatory Visit: Payer: Medicare HMO | Admitting: Urology

## 2021-10-31 ENCOUNTER — Encounter: Payer: Self-pay | Admitting: Urology

## 2021-10-31 VITALS — BP 141/92 | HR 98 | Ht 70.0 in | Wt 170.0 lb

## 2021-10-31 DIAGNOSIS — N138 Other obstructive and reflux uropathy: Secondary | ICD-10-CM | POA: Diagnosis not present

## 2021-10-31 DIAGNOSIS — N401 Enlarged prostate with lower urinary tract symptoms: Secondary | ICD-10-CM | POA: Diagnosis not present

## 2021-10-31 LAB — MICROSCOPIC EXAMINATION: RBC, Urine: NONE SEEN /hpf (ref 0–2)

## 2021-10-31 LAB — URINALYSIS, ROUTINE W REFLEX MICROSCOPIC
Bilirubin, UA: NEGATIVE
Glucose, UA: NEGATIVE
Ketones, UA: NEGATIVE
Nitrite, UA: NEGATIVE
Protein,UA: NEGATIVE
RBC, UA: NEGATIVE
Specific Gravity, UA: 1.01 (ref 1.005–1.030)
Urobilinogen, Ur: 1 mg/dL (ref 0.2–1.0)
pH, UA: 6.5 (ref 5.0–7.5)

## 2021-10-31 MED ORDER — FINASTERIDE 5 MG PO TABS: 5.0000 mg | ORAL_TABLET | Freq: Every day | ORAL | 3 refills | Status: DC

## 2021-10-31 MED ORDER — TAMSULOSIN HCL 0.4 MG PO CAPS: 0.4000 mg | ORAL_CAPSULE | Freq: Every day | ORAL | 3 refills | Status: DC

## 2021-10-31 NOTE — Progress Notes (Signed)
10/31/2021 10:59 AM   Spencer Stanley 1960/07/13 [id number]  Referring provider: Claretta Fraise, MD [place],  [place] [address]  No chief complaint on file.   HPI:  F/u -    1) gross hematuria-patient noted red urine Nov and Dec 2021. UA 12/21 showed greater than 30 red blood cells per high-powered field, urine cx no growth. He underwent an ultrasound 01/22 which was benign, possible 5 mm LLP (echogenic focus no shadowing). He is a smoker.  Cr 0.78. CT and cystoscopy benign 03/22. 06/22 renal US benign. He had one flash of blood in the urine again. UA with mod bac and 3-10 rbc. Rx'd 5ari June 2022, but didn't want to start.    2) BPH-prostate was 58 g on 01/22 renal/bladder ultrasound with some bladder wall thickening/trabeculation and about 50 g on CT 03/22. PSA 0.9 12/20. He has a weak stream, urgency and urge UI. No pad use. PVR was 13 ml and 31 ml. He started tamsulosin 02/22. Not much change in urgency. AUASS = 17, dissatisfied. Cysto 03/22, with lateral lobes - good 5ari, OAB med or PUL candidate. He continues to have some hesitancy. Rx'd 5ari June 2022 but pt declined due to risks of ED and the started taking it. AUASS was 16 on tams.    3) left proximal ureteral stone - possible 4-5 mm left proximal stone on CT 03/22. No hydro or other stones. Vascular calcifications also present. 06/22 renal US with no stone or hydro. KUB ordered not done. No flank pain.    He returns in management of the above.  His March 2023 PSA was 1.1.  He continues tamsulosin and finasteride. He hasn't passed a stone nor had hematuria. He traveled and forgot the tamsulosin. He noticed a weaker stream. AUASS = 14.   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 His BP is kept low as he's had varices rupture. He stopped drinking. 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       PMH: Past Medical History:  Diagnosis Date   Anxiety    Cirrhosis ([place])    Cirrhosis of liver  ([place])    Diabetes mellitus without complication ([place])    Esophageal varices ([place])    High cholesterol    Lyme disease    [place] spotted fever     Surgical History: Past Surgical History:  Procedure Laterality Date   ESOPHAGEAL VARICE LIGATION     TIPS   HERNIA REPAIR      Home Medications:  Allergies as of 10/31/2021       Reactions   Other Other (See Comments)   Other reaction(s): Mental Status Changes (intolerance) After medicated induced coma patient seeing things that weren't there.         Medication List        Accurate as of October 31, 2021 10:59 AM. If you have any questions, ask your nurse or doctor.          Accu-Chek Aviva Plus test strip Generic drug: glucose blood CHECK BLOOD SUGAR THREE TIMES DAILY Dx E11.9   Accu-Chek Aviva Soln Use with Accu-Chek meter   Accu-Chek Softclix Lancets lancets TEST BLOOD SUGAR THREE TIMES DAILY Dx E11.9   atorvastatin 10 MG tablet Commonly known as: LIPITOR TAKE 1 TABLET EVERY DAY   CALCIUM-MAGNESIUM-ZINC PO Take 1 capsule by mouth 2 (two) times daily.   DropSafe Alcohol Prep 70 % Pads CHECK BLOOD SUGARS THREE TIMES DAILY  finasteride 5 MG tablet Commonly known as: Proscar Take 1 tablet (5 mg total) by mouth daily.   FLUoxetine 20 MG capsule Commonly known as: PROZAC TAKE 1 CAPSULE EVERY DAY   lactulose 10 GM/15ML solution Commonly known as: CHRONULAC Take 45 g by mouth 3 (three) times daily.   metFORMIN 750 MG 24 hr tablet Commonly known as: GLUCOPHAGE-XR TAKE 1 TABLET EVERY DAY WITH BREAKFAST   minocycline 100 MG capsule Commonly known as: MINOCIN TAKE 1 CAPSULE TWICE DAILY   multivitamin Tabs tablet Take 1 tablet by mouth daily.   pantoprazole 40 MG tablet Commonly known as: PROTONIX TAKE 1 TABLET EVERY DAY   rifaximin 550 MG Tabs tablet Commonly known as: XIFAXAN Take 1 tablet (550 mg total) by mouth 2 (two) times daily.   sildenafil 20 MG tablet Commonly known as:  REVATIO Take 2-5 pills at once, orally, with each sexual encounter   spironolactone 25 MG tablet Commonly known as: ALDACTONE Take 1 tablet (25 mg total) by mouth daily.   tamsulosin 0.4 MG Caps capsule Commonly known as: FLOMAX Take 1 capsule (0.4 mg total) by mouth daily after supper.   ZINC PO Take by mouth.   zinc sulfate 220 (50 Zn) MG capsule Take 1 capsule by mouth daily.        Allergies:  Allergies  Allergen Reactions   Other Other (See Comments)    Other reaction(s): Mental Status Changes (intolerance) After medicated induced coma patient seeing things that weren't there.     Family History: Family History  Problem Relation Age of Onset   Aneurysm Mother    Cancer Mother        breast   Dementia Father    Cancer Father        brain tumor   Heart failure Father    Diabetes Sister     Social History:  reports that he has been smoking cigarettes. He has a 16.50 pack-year smoking history. He quit smokeless tobacco use about 42 years ago.  His smokeless tobacco use included chew. He reports that he does not drink alcohol and does not use drugs.   Physical Exam: There were no vitals taken for this visit.  Constitutional:  Alert and oriented, No acute distress. HEENT: [place] AT, moist mucus membranes.  Trachea midline, no masses. Cardiovascular: No clubbing, cyanosis, or edema. Respiratory: Normal respiratory effort, no increased work of breathing. GI: Abdomen is soft, nontender, nondistended, no abdominal masses GU: No CVA tenderness Lymph: No cervical or inguinal lymphadenopathy. Skin: No rashes, bruises or suspicious lesions. Neurologic: Grossly intact, no focal deficits, moving all 4 extremities. Psychiatric: Normal mood and affect. GU: prostate 60 g and smooth - no hard area or nodule   Laboratory Data: Lab Results  Component Value Date   WBC 4.6 08/03/2021   HGB 11.0 (L) 08/03/2021   HCT 34.3 (L) 08/03/2021   MCV 80 08/03/2021   PLT 84 (LL)  08/03/2021    Lab Results  Component Value Date   CREATININE 0.96 08/03/2021    No results found for: "PSA"  Lab Results  Component Value Date   TESTOSTERONE 913 06/20/2014    Lab Results  Component Value Date   HGBA1C 6.5 (H) 08/03/2021    Urinalysis    Component Value Date/Time   APPEARANCEUR Clear 04/18/2021 1036   GLUCOSEU Negative 04/18/2021 1036   BILIRUBINUR Negative 04/18/2021 1036   PROTEINUR Negative 04/18/2021 1036   NITRITE Negative 04/18/2021 1036   LEUKOCYTESUR Trace (A) 04/18/2021 1036  Lab Results  Component Value Date   LABMICR See below: 04/18/2021   WBCUA 0-5 04/18/2021   LABEPIT 0-10 04/18/2021   MUCUS Present 07/19/2020   BACTERIA None seen 04/18/2021    Pertinent Imaging: N/a   Results for orders placed during the hospital encounter of 10/14/20  US RENAL  Narrative CLINICAL DATA:  Left ureteral stone.  EXAM: RENAL / URINARY TRACT ULTRASOUND COMPLETE  COMPARISON:  CT abdomen and pelvis 07/16/2020. Renal ultrasound 05/18/2020.  FINDINGS: Right Kidney:  Renal measurements: 11.6 x 6.0 x 5.9 cm = volume: 214 mL. Echogenicity within normal limits. No mass or hydronephrosis visualized.  Left Kidney:  Renal measurements: 11.3 x 5.2 x 6.0 cm = volume: 185 mL. Echogenicity within normal limits. No mass or hydronephrosis visualized.  Bladder:  Mild circumferential bladder wall thickening which may be at least in part due to underdistension.  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  IMPRESSION: 1. Unremarkable sonographic appearance of the kidneys. 2. Mild circumferential bladder wall thickening which may be at least in part due to underdistension although cystitis is also possible. 3. Splenomegaly.   Electronically Signed By: Logan Bores M.D. On: 10/14/2020 13:51  No results found for this or any previous visit.  No results found for this or any previous  visit.  No results found for this or any previous visit.   Assessment & Plan:    1. BPH with obstruction/lower urinary tract symptoms Tamsulosin and finasteride refilled. PSA low. DRE benign    No follow-ups on file.  Festus Aloe, MD  [facility]  [address] [place], [place] [address] [phone]

## 2021-11-08 ENCOUNTER — Encounter: Payer: Self-pay | Admitting: Family Medicine

## 2021-11-08 ENCOUNTER — Ambulatory Visit (INDEPENDENT_AMBULATORY_CARE_PROVIDER_SITE_OTHER): Payer: Medicare HMO | Admitting: Family Medicine

## 2021-11-08 VITALS — BP 140/78 | HR 70 | Temp 98.3°F | Ht 70.0 in | Wt 168.2 lb

## 2021-11-08 DIAGNOSIS — Z23 Encounter for immunization: Secondary | ICD-10-CM | POA: Diagnosis not present

## 2021-11-08 DIAGNOSIS — K7030 Alcoholic cirrhosis of liver without ascites: Secondary | ICD-10-CM | POA: Diagnosis not present

## 2021-11-08 DIAGNOSIS — E119 Type 2 diabetes mellitus without complications: Secondary | ICD-10-CM

## 2021-11-08 DIAGNOSIS — B351 Tinea unguium: Secondary | ICD-10-CM | POA: Diagnosis not present

## 2021-11-08 DIAGNOSIS — E782 Mixed hyperlipidemia: Secondary | ICD-10-CM | POA: Diagnosis not present

## 2021-11-08 LAB — BAYER DCA HB A1C WAIVED: HB A1C (BAYER DCA - WAIVED): 6.4 % — ABNORMAL HIGH (ref 4.8–5.6)

## 2021-11-08 MED ORDER — CICLOPIROX 8 % EX SOLN: Freq: Every day | CUTANEOUS | 3 refills | Status: DC

## 2021-11-08 MED ORDER — MINOCYCLINE HCL 100 MG PO CAPS
100.0000 mg | ORAL_CAPSULE | Freq: Two times a day (BID) | ORAL | 3 refills | Status: DC
Start: 2021-11-08 — End: 2022-03-21

## 2021-11-09 LAB — LIPID PANEL
Chol/HDL Ratio: 2.8 ratio (ref 0.0–5.0)
Cholesterol, Total: 108 mg/dL (ref 100–199)
HDL: 38 mg/dL — ABNORMAL LOW (ref >39)
LDL Chol Calc (NIH): 49 mg/dL (ref 0–99)
Triglycerides: 112 mg/dL (ref 0–149)
VLDL Cholesterol Cal: 21 mg/dL (ref 5–40)

## 2021-11-09 LAB — CMP14+EGFR
ALT: 18 IU/L (ref 0–44)
AST: 22 IU/L (ref 0–40)
Albumin/Globulin Ratio: 1.4 (ref 1.2–2.2)
Albumin: 4 g/dL (ref 3.8–4.8)
Alkaline Phosphatase: 119 IU/L (ref 44–121)
BUN/Creatinine Ratio: 18 (ref 10–24)
BUN: 20 mg/dL (ref 8–27)
Bilirubin Total: 0.5 mg/dL (ref 0.0–1.2)
CO2: 21 mmol/L (ref 20–29)
Calcium: 9.5 mg/dL (ref 8.6–10.2)
Chloride: 108 mmol/L — ABNORMAL HIGH (ref 96–106)
Creatinine, Ser: 1.1 mg/dL (ref 0.76–1.27)
Globulin, Total: 2.8 g/dL (ref 1.5–4.5)
Glucose: 109 mg/dL — ABNORMAL HIGH (ref 70–99)
Potassium: 4.3 mmol/L (ref 3.5–5.2)
Sodium: 144 mmol/L (ref 134–144)
Total Protein: 6.8 g/dL (ref 6.0–8.5)
eGFR: 76 mL/min/{1.73_m2} (ref >59)

## 2021-11-09 LAB — CBC WITH DIFFERENTIAL/PLATELET
Basophils Absolute: 0.1 10*3/uL (ref 0.0–0.2)
Basos: 1 %
EOS (ABSOLUTE): 0.3 10*3/uL (ref 0.0–0.4)
Eos: 6 %
Hematocrit: 36.2 % — ABNORMAL LOW (ref 37.5–51.0)
Hemoglobin: 12.3 g/dL — ABNORMAL LOW (ref 13.0–17.7)
Immature Grans (Abs): 0 10*3/uL (ref 0.0–0.1)
Immature Granulocytes: 0 %
Lymphocytes Absolute: 1.1 10*3/uL (ref 0.7–3.1)
Lymphs: 23 %
MCH: 27 pg (ref 26.6–33.0)
MCHC: 34 g/dL (ref 31.5–35.7)
MCV: 79 fL (ref 79–97)
Monocytes Absolute: 0.5 10*3/uL (ref 0.1–0.9)
Monocytes: 11 %
Neutrophils Absolute: 2.8 10*3/uL (ref 1.4–7.0)
Neutrophils: 59 %
Platelets: 93 10*3/uL — CL (ref 150–450)
RBC: 4.56 x10E6/uL (ref 4.14–5.80)
RDW: 17 % — ABNORMAL HIGH (ref 11.6–15.4)
WBC: 4.6 10*3/uL (ref 3.4–10.8)

## 2021-11-09 LAB — AMMONIA: Ammonia: 74 ug/dL (ref 40–200)

## 2021-11-21 ENCOUNTER — Ambulatory Visit: Payer: Self-pay | Admitting: *Deleted

## 2021-11-21 NOTE — Chronic Care Management (AMB) (Signed)
  Chronic Care Management   Note  11/21/2021 Name: Spencer Stanley MRN: [id number] DOB: 04-06-1961   Patient has not recently engaged with the Chronic Care Management RN Care Manager. Removing RN Care Manager from Care Team and closing [place]. If patient is currently engaged with another CCM team member I will forward this encounter to inform them of my case closure. Patient may be eligible for re-engagement with RN Care Manager in the future if necessary and can discuss this with their PCP.  Chong Sicilian, BSN, RN-BC Embedded Chronic Care Manager [facility] [facility] Direct Dial: [phone]

## 2021-12-10 ENCOUNTER — Other Ambulatory Visit: Payer: Self-pay | Admitting: Family Medicine

## 2022-01-08 ENCOUNTER — Other Ambulatory Visit: Payer: Self-pay | Admitting: Family Medicine

## 2022-01-13 DIAGNOSIS — K7581 Nonalcoholic steatohepatitis (NASH): Secondary | ICD-10-CM | POA: Diagnosis not present

## 2022-01-13 DIAGNOSIS — E785 Hyperlipidemia, unspecified: Secondary | ICD-10-CM | POA: Diagnosis not present

## 2022-01-13 DIAGNOSIS — K8020 Calculus of gallbladder without cholecystitis without obstruction: Secondary | ICD-10-CM | POA: Diagnosis not present

## 2022-01-13 DIAGNOSIS — K7682 Hepatic encephalopathy: Secondary | ICD-10-CM | POA: Diagnosis not present

## 2022-01-13 DIAGNOSIS — I8390 Asymptomatic varicose veins of unspecified lower extremity: Secondary | ICD-10-CM | POA: Diagnosis not present

## 2022-01-13 DIAGNOSIS — Z720 Tobacco use: Secondary | ICD-10-CM | POA: Diagnosis not present

## 2022-01-13 DIAGNOSIS — E119 Type 2 diabetes mellitus without complications: Secondary | ICD-10-CM | POA: Diagnosis not present

## 2022-01-13 DIAGNOSIS — R600 Localized edema: Secondary | ICD-10-CM | POA: Diagnosis not present

## 2022-01-13 DIAGNOSIS — R161 Splenomegaly, not elsewhere classified: Secondary | ICD-10-CM | POA: Diagnosis not present

## 2022-01-13 DIAGNOSIS — K7460 Unspecified cirrhosis of liver: Secondary | ICD-10-CM | POA: Diagnosis not present

## 2022-02-12 ENCOUNTER — Other Ambulatory Visit: Payer: Self-pay | Admitting: Family Medicine

## 2022-02-13 ENCOUNTER — Ambulatory Visit: Payer: Medicare HMO | Admitting: Family Medicine

## 2022-03-16 ENCOUNTER — Encounter: Payer: Self-pay | Admitting: Family Medicine

## 2022-03-16 ENCOUNTER — Ambulatory Visit (INDEPENDENT_AMBULATORY_CARE_PROVIDER_SITE_OTHER): Payer: Medicare HMO | Admitting: Family Medicine

## 2022-03-16 VITALS — BP 89/56 | HR 81 | Temp 98.3°F | Ht 70.0 in | Wt 149.4 lb

## 2022-03-16 DIAGNOSIS — K7030 Alcoholic cirrhosis of liver without ascites: Secondary | ICD-10-CM | POA: Diagnosis not present

## 2022-03-16 DIAGNOSIS — K921 Melena: Secondary | ICD-10-CM

## 2022-03-16 DIAGNOSIS — Z23 Encounter for immunization: Secondary | ICD-10-CM | POA: Diagnosis not present

## 2022-03-16 DIAGNOSIS — E119 Type 2 diabetes mellitus without complications: Secondary | ICD-10-CM

## 2022-03-16 DIAGNOSIS — E782 Mixed hyperlipidemia: Secondary | ICD-10-CM | POA: Diagnosis not present

## 2022-03-16 LAB — COAGUCHEK XS/INR WAIVED
INR: 1.2 — ABNORMAL HIGH (ref 0.9–1.1)
Prothrombin Time: 14.7 s

## 2022-03-16 LAB — BAYER DCA HB A1C WAIVED: HB A1C (BAYER DCA - WAIVED): 5.4 % (ref 4.8–5.6)

## 2022-03-16 MED ORDER — FLUOXETINE HCL 20 MG PO CAPS: 20.0000 mg | ORAL_CAPSULE | Freq: Every day | ORAL | 3 refills | Status: DC

## 2022-03-16 MED ORDER — ATORVASTATIN CALCIUM 10 MG PO TABS: 10.0000 mg | ORAL_TABLET | Freq: Every day | ORAL | 3 refills | Status: DC

## 2022-03-16 NOTE — Progress Notes (Signed)
Subjective:  Patient ID: Spencer Stanley,  male    DOB: [birth year]  Age: 61 y.o.    CC: Medical Management of Chronic Issues   HPI Spencer Stanley presents for  follow-up of hypertension. Patient has no history of headache chest pain or shortness of breath or recent cough. Patient also denies symptoms of TIA such as numbness weakness lateralizing. Patient denies side effects from medication. States taking it regularly.  Patient also  in for follow-up of elevated cholesterol. Doing well without complaints on current medication. Denies side effects  including myalgia and arthralgia and nausea. Also in today for liver function testing. Currently no chest pain, shortness of breath or other cardiovascular related symptoms noted.  Cirrhosis pt. Checks ammonia and INR  to follow function. Follow-up of diabetes. Patient does not check blood sugar at home. Feeling good lately so didn't feel it necessary.  Patient denies symptoms such as excessive hunger or urinary frequency, excessive hunger, nausea No significant hypoglycemic spells noted. Medications reviewed. Pt reports taking them regularly. Pt. denies complication/adverse reaction today.    History Spencer Stanley has a past medical history of Anxiety, Cirrhosis ([place]), Cirrhosis of liver ([place]), Diabetes mellitus without complication ([place]), Esophageal varices ([place]), High cholesterol, Lyme disease, and [facility] spotted fever.   Spencer Stanley has a past surgical history that includes Hernia repair and Esophageal varice ligation.   His family history includes Aneurysm in his mother; Cancer in his father and mother; Dementia in his father; Diabetes in his sister; Heart failure in his father.Spencer Stanley reports that Spencer Stanley has been smoking cigarettes. Spencer Stanley has a 16.50 pack-year smoking history. Spencer Stanley quit smokeless tobacco use about 42 years ago.  His smokeless tobacco use included chew. Spencer Stanley reports that Spencer Stanley does not drink alcohol and does not use drugs.  Current Outpatient Medications on  File Prior to Visit  Medication Sig Dispense Refill   Accu-Chek Softclix Lancets lancets TEST BLOOD SUGAR THREE TIMES DAILY Dx E11.9 300 each 3   Alcohol Swabs (DROPSAFE ALCOHOL PREP) 70 % PADS TEST BLOOD SUGAR THREE TIMES DAILY Dx E11.9 300 each 3   Blood Glucose Calibration (ACCU-CHEK AVIVA) SOLN Use with Accu-Chek meter 1 each 0   CALCIUM-MAGNESIUM-ZINC PO Take 1 capsule by mouth 2 (two) times daily.     ciclopirox (PENLAC) 8 % solution Apply topically at bedtime. Apply over nail and surrounding skin. Apply daily over previous coat. After seven (7) days, may remove with alcohol and continue cycle. 39.6 mL 3   finasteride (PROSCAR) 5 MG tablet Take 1 tablet (5 mg total) by mouth daily. 90 tablet 3   glucose blood (ACCU-CHEK AVIVA PLUS) test strip CHECK BLOOD SUGAR THREE TIMES DAILY Dx E11.9 300 strip 3   lactulose (CHRONULAC) 10 GM/15ML solution Take 45 g by mouth 3 (three) times daily.     minocycline (MINOCIN) 100 MG capsule Take 1 capsule (100 mg total) by mouth 2 (two) times daily. 180 capsule 3   Multiple Vitamins-Minerals (ZINC PO) Take by mouth.     multivitamin (ONE-A-DAY MEN'S) TABS tablet Take 1 tablet by mouth daily.     pantoprazole (PROTONIX) 40 MG tablet TAKE 1 TABLET EVERY DAY 90 tablet 0   rifaximin (XIFAXAN) 550 MG TABS tablet Take 1 tablet (550 mg total) by mouth 2 (two) times daily. 60 tablet 5   sildenafil (REVATIO) 20 MG tablet Take 2-5 pills at once, orally, with each sexual encounter 20 tablet 0   spironolactone (ALDACTONE) 25 MG tablet TAKE 1 TABLET EVERY DAY 90  tablet 0   tamsulosin (FLOMAX) 0.4 MG CAPS capsule Take 1 capsule (0.4 mg total) by mouth daily after supper. 90 capsule 3   zinc sulfate 220 (50 Zn) MG capsule Take 1 capsule by mouth daily.     No current facility-administered medications on file prior to visit.    ROS Review of Systems  Constitutional:  Negative for fever.  Respiratory:  Negative for shortness of breath.   Cardiovascular:  Negative for  chest pain.  Musculoskeletal:  Negative for arthralgias.  Skin:  Negative for rash.    Objective:  BP (!) 89/56   Pulse 81   Temp 98.3 F (36.8 C)   Ht _0  (1.778 m)   Wt 149 lb 6.4 oz (67.8 kg)   SpO2 100%   BMI 21.44 kg/m   BP Readings from Last 3 Encounters:  03/16/22 (!) 89/56  11/08/21 140/78  10/31/21 (!) 141/92    Wt Readings from Last 3 Encounters:  03/16/22 149 lb 6.4 oz (67.8 kg)  11/08/21 168 lb 3.2 oz (76.3 kg)  10/31/21 170 lb (77.1 kg)     Physical Exam Vitals reviewed.  Constitutional:      Appearance: Spencer Stanley is well-developed.  HENT:     Head: Normocephalic and atraumatic.     Right Ear: External ear normal.     Left Ear: External ear normal.     Mouth/Throat:     Pharynx: No oropharyngeal exudate or posterior oropharyngeal erythema.  Eyes:     Pupils: Pupils are equal, round, and reactive to light.  Cardiovascular:     Rate and Rhythm: Normal rate and regular rhythm.     Heart sounds: No murmur heard. Pulmonary:     Effort: No respiratory distress.     Breath sounds: Normal breath sounds.  Musculoskeletal:     Cervical back: Normal range of motion and neck supple.  Neurological:     Mental Status: Spencer Stanley is alert and oriented to person, place, and time.     Diabetic Foot Exam - Simple   No data filed     Lab Results  Component Value Date   HGBA1C 6.4 (H) 11/08/2021   HGBA1C 6.5 (H) 08/03/2021   HGBA1C 6.8 (H) 05/04/2021    Assessment & Plan:   Spencer Stanley was seen today for medical management of chronic issues.  Diagnoses and all orders for this visit:  Alcoholic cirrhosis of liver without ascites ([place]) -     Ammonia  Mixed hyperlipidemia -     Lipid panel  Type 2 diabetes mellitus without complication, without long-term current use of insulin (HCC) -     Bayer DCA Hb A1c Waived -     CBC with Differential/Platelet -     CMP14+EGFR -     Microalbumin / creatinine urine ratio  Hematochezia -     CoaguChek XS/INR Waived  Other  orders -     atorvastatin (LIPITOR) 10 MG tablet; Take 1 tablet (10 mg total) by mouth daily. -     FLUoxetine (PROZAC) 20 MG capsule; Take 1 capsule (20 mg total) by mouth daily.   I have changed Spencer Stanley's atorvastatin and FLUoxetine. I am also having him maintain his multivitamin, CALCIUM-MAGNESIUM-ZINC PO, rifaximin, Multiple Vitamins-Minerals (ZINC PO), lactulose, Accu-Chek Aviva, zinc sulfate, sildenafil, Accu-Chek Aviva Plus, pantoprazole, finasteride, tamsulosin, ciclopirox, minocycline, spironolactone, Accu-Chek Softclix Lancets, and DropSafe Alcohol Prep.  Meds ordered this encounter  Medications   atorvastatin (LIPITOR) 10 MG tablet    Sig: Take 1  tablet (10 mg total) by mouth daily.    Dispense:  90 tablet    Refill:  3   FLUoxetine (PROZAC) 20 MG capsule    Sig: Take 1 capsule (20 mg total) by mouth daily.    Dispense:  90 capsule    Refill:  3     Follow-up: Return in about 3 months (around 06/16/2022).  Claretta Fraise, M.D.

## 2022-03-16 NOTE — Addendum Note (Signed)
Addended by: Baldomero Lamy B on: 03/16/2022 02:21 PM   Modules accepted: Orders

## 2022-03-17 LAB — LIPID PANEL
Chol/HDL Ratio: 3.1 ratio (ref 0.0–5.0)
Cholesterol, Total: 122 mg/dL (ref 100–199)
HDL: 39 mg/dL — ABNORMAL LOW (ref >39)
LDL Chol Calc (NIH): 64 mg/dL (ref 0–99)
Triglycerides: 103 mg/dL (ref 0–149)
VLDL Cholesterol Cal: 19 mg/dL (ref 5–40)

## 2022-03-17 LAB — CMP14+EGFR
ALT: 18 IU/L (ref 0–44)
AST: 26 IU/L (ref 0–40)
Albumin/Globulin Ratio: 1.5 (ref 1.2–2.2)
Albumin: 4.3 g/dL (ref 3.9–4.9)
Alkaline Phosphatase: 108 IU/L (ref 44–121)
BUN/Creatinine Ratio: 15 (ref 10–24)
BUN: 13 mg/dL (ref 8–27)
Bilirubin Total: 1.1 mg/dL (ref 0.0–1.2)
CO2: 21 mmol/L (ref 20–29)
Calcium: 9.4 mg/dL (ref 8.6–10.2)
Chloride: 104 mmol/L (ref 96–106)
Creatinine, Ser: 0.85 mg/dL (ref 0.76–1.27)
Globulin, Total: 2.9 g/dL (ref 1.5–4.5)
Glucose: 104 mg/dL — ABNORMAL HIGH (ref 70–99)
Potassium: 3.6 mmol/L (ref 3.5–5.2)
Sodium: 143 mmol/L (ref 134–144)
Total Protein: 7.2 g/dL (ref 6.0–8.5)
eGFR: 98 mL/min/{1.73_m2} (ref >59)

## 2022-03-17 LAB — CBC WITH DIFFERENTIAL/PLATELET
Basophils Absolute: 0.1 10*3/uL (ref 0.0–0.2)
Basos: 1 %
EOS (ABSOLUTE): 0.2 10*3/uL (ref 0.0–0.4)
Eos: 4 %
Hematocrit: 41.2 % (ref 37.5–51.0)
Hemoglobin: 13.6 g/dL (ref 13.0–17.7)
Immature Grans (Abs): 0 10*3/uL (ref 0.0–0.1)
Immature Granulocytes: 0 %
Lymphocytes Absolute: 1 10*3/uL (ref 0.7–3.1)
Lymphs: 20 %
MCH: 28 pg (ref 26.6–33.0)
MCHC: 33 g/dL (ref 31.5–35.7)
MCV: 85 fL (ref 79–97)
Monocytes Absolute: 0.5 10*3/uL (ref 0.1–0.9)
Monocytes: 10 %
Neutrophils Absolute: 3.3 10*3/uL (ref 1.4–7.0)
Neutrophils: 65 %
Platelets: 109 10*3/uL — ABNORMAL LOW (ref 150–450)
RBC: 4.86 x10E6/uL (ref 4.14–5.80)
RDW: 15.4 % (ref 11.6–15.4)
WBC: 5 10*3/uL (ref 3.4–10.8)

## 2022-03-17 LAB — AMMONIA: Ammonia: 72 ug/dL (ref 40–200)

## 2022-03-17 LAB — MICROALBUMIN / CREATININE URINE RATIO
Creatinine, Urine: 222.9 mg/dL
Microalb/Creat Ratio: 10 mg/g creat (ref 0–29)
Microalbumin, Urine: 22.2 ug/mL

## 2022-03-17 NOTE — Progress Notes (Signed)
Hello Spencer Stanley,  Your lab result is normal and/or stable.Some minor variations that are not significant are commonly marked abnormal, but do not represent any medical problem for you.  Best regards, Coretha Creswell, M.D.

## 2022-03-21 ENCOUNTER — Ambulatory Visit (INDEPENDENT_AMBULATORY_CARE_PROVIDER_SITE_OTHER): Payer: Medicare HMO

## 2022-03-21 DIAGNOSIS — Z0000 Encounter for general adult medical examination without abnormal findings: Secondary | ICD-10-CM | POA: Diagnosis not present

## 2022-03-21 NOTE — Patient Instructions (Signed)
  Mr. Spencer Stanley , Thank you for taking time to come for your Medicare Wellness Visit. I appreciate your ongoing commitment to your health goals. Please review the following plan we discussed and let me know if I can assist you in the future.   These are the goals we discussed:  Goals      Exercise 150 minutes per week (moderate activity)     Patient Stated     03/21/2022 AWV Goal: Exercise for General Health  Patient will verbalize understanding of the benefits of increased physical activity: Exercising regularly is important. It will improve your overall fitness, flexibility, and endurance. Regular exercise also will improve your overall health. It can help you control your weight, reduce stress, and improve your bone density. Over the next year, patient will increase physical activity as tolerated with a goal of at least 150 minutes of moderate physical activity per week.  You can tell that you are exercising at a moderate intensity if your heart starts beating faster and you start breathing faster but can still hold a conversation. Moderate-intensity exercise ideas include: Walking 1 mile (1.6 km) in about 15 minutes Biking Hiking Golfing Dancing Water aerobics Patient will verbalize understanding of everyday activities that increase physical activity by providing examples like the following: Yard work, such as: Sales promotion account executive Gardening Washing windows or floors Patient will be able to explain general safety guidelines for exercising:  Before you start a new exercise program, talk with your health care provider. Do not exercise so much that you hurt yourself, feel dizzy, or get very short of breath. Wear comfortable clothes and wear shoes with good support. Drink plenty of water while you exercise to prevent dehydration or heat stroke. Work out until your breathing and your heartbeat get faster.          This is a list of the screening recommended for you and due dates:  Health Maintenance  Topic Date Due   Eye exam for diabetics  12/16/2021   Complete foot exam   02/01/2022   Medicare Annual Wellness Visit  03/17/2022   COVID-19 Vaccine (4 - Pfizer risk series) 04/01/2022*   Tetanus Vaccine  08/24/2022   Hemoglobin A1C  09/14/2022   Yearly kidney function blood test for diabetes  03/17/2023   Yearly kidney health urinalysis for diabetes  03/17/2023   Colon Cancer Screening  09/22/2025   Flu Shot  Completed   Hepatitis C Screening: USPSTF Recommendation to screen - Ages 18-79 yo.  Completed   HIV Screening  Completed   Zoster (Shingles) Vaccine  Completed   HPV Vaccine  Aged Out  *Topic was postponed. The date shown is not the original due date.

## 2022-03-21 NOTE — Progress Notes (Signed)
Subjective:   Spencer Stanley is a 61 y.o. male who presents for Medicare Annual/Subsequent preventive examination.  I connected with  [name] on 03/21/22 by a audio enabled telemedicine application and verified that I am speaking with the correct person using two identifiers.  Patient Location: Home  Provider Location: Office/Clinic  I discussed the limitations of evaluation and management by telemedicine. The patient expressed understanding and agreed to proceed.   Review of Systems    Defer to PCP   Cardiac Risk Factors include: advanced age (>72mn, >>73women);diabetes mellitus;dyslipidemia;hypertension;male gender;smoking/ tobacco exposure     Objective:    There were no vitals filed for this visit. There is no height or weight on file to calculate BMI.     03/21/2022    2:55 PM 03/17/2021    3:51 PM 03/21/2018    2:32 PM 03/20/2017    2:29 PM 12/08/2015   12:35 PM  Advanced Directives  Does Patient Have a Medical Advance Directive? No No No Yes No  Type of Advance Directive    Living will;Healthcare Power of Attorney   Does patient want to make changes to medical advance directive?    No - Patient declined   Copy of [facility] [place] Chart?    No - copy requested   Would patient like information on creating a medical advance directive? No - Patient declined No - Patient declined Yes (MAU/Ambulatory/Procedural Areas - Information given)      Current Medications (verified) Outpatient Encounter Medications as of 03/21/2022  Medication Sig   Accu-Chek Softclix Lancets lancets TEST BLOOD SUGAR THREE TIMES DAILY Dx E11.9   Alcohol Swabs (DROPSAFE ALCOHOL PREP) 70 % PADS TEST BLOOD SUGAR THREE TIMES DAILY Dx E11.9   atorvastatin (LIPITOR) 10 MG tablet Take 1 tablet (10 mg total) by mouth daily.   Blood Glucose Calibration (ACCU-CHEK AVIVA) SOLN Use with Accu-Chek meter   CALCIUM-MAGNESIUM-ZINC PO Take 1 capsule by mouth 2 (two) times daily.   ciclopirox (PENLAC)  8 % solution Apply topically at bedtime. Apply over nail and surrounding skin. Apply daily over previous coat. After seven (7) days, may remove with alcohol and continue cycle.   finasteride (PROSCAR) 5 MG tablet Take 1 tablet (5 mg total) by mouth daily.   FLUoxetine (PROZAC) 20 MG capsule Take 1 capsule (20 mg total) by mouth daily.   glucose blood (ACCU-CHEK AVIVA PLUS) test strip CHECK BLOOD SUGAR THREE TIMES DAILY Dx E11.9   lactulose (CHRONULAC) 10 GM/15ML solution Take 45 g by mouth 3 (three) times daily.   Multiple Vitamins-Minerals (ZINC PO) Take by mouth.   multivitamin (ONE-A-DAY MEN'S) TABS tablet Take 1 tablet by mouth daily.   pantoprazole (PROTONIX) 40 MG tablet TAKE 1 TABLET EVERY DAY   rifaximin (XIFAXAN) 550 MG TABS tablet Take 1 tablet (550 mg total) by mouth 2 (two) times daily.   sildenafil (REVATIO) 20 MG tablet Take 2-5 pills at once, orally, with each sexual encounter   tamsulosin (FLOMAX) 0.4 MG CAPS capsule Take 1 capsule (0.4 mg total) by mouth daily after supper.   zinc sulfate 220 (50 Zn) MG capsule Take 1 capsule by mouth daily.   [DISCONTINUED] minocycline (MINOCIN) 100 MG capsule Take 1 capsule (100 mg total) by mouth 2 (two) times daily.   spironolactone (ALDACTONE) 25 MG tablet TAKE 1 TABLET EVERY DAY (Patient not taking: Reported on 03/21/2022)   No facility-administered encounter medications on file as of 03/21/2022.    Allergies (verified) Other  History: Past Medical History:  Diagnosis Date   Anxiety    Cirrhosis ([place])    Cirrhosis of liver ([place])    Diabetes mellitus without complication ([place])    Esophageal varices ([facility])    High cholesterol    Lyme disease    [place] spotted fever    Past Surgical History:  Procedure Laterality Date   ESOPHAGEAL VARICE LIGATION     TIPS   HERNIA REPAIR     Family History  Problem Relation Age of Onset   Aneurysm Mother    Cancer Mother        breast   Dementia Father    Cancer Father         brain tumor   Heart failure Father    Diabetes Sister    Social History   Socioeconomic History   Marital status: Soil scientist    Spouse name: Not on file   Number of children: 0   Years of education: 16   Highest education level: Bachelor's degree (e.g., BA, AB, BS)  Occupational History   Occupation: disability  Tobacco Use   Smoking status: Every Day    Packs/day: 0.50    Years: 33.00    Total pack years: 16.50    Types: Cigarettes   Smokeless tobacco: Former    Types: Chew    Quit date: 1981   Tobacco comments:    03/21/2018 cut back to 1/2 ppd  Vaping Use   Vaping Use: Never used  Substance and Sexual Activity   Alcohol use: No    Comment: no alcohol since 2013   Drug use: No   Sexual activity: Yes  Other Topics Concern   Not on file  Social History Narrative   Not on file   Social Determinants of Health   Financial Resource Strain: Low Risk  (03/21/2022)   Overall Financial Resource Strain (CARDIA)    Difficulty of Paying Living Expenses: Not hard at all  Food Insecurity: No Food Insecurity (03/21/2022)   Hunger Vital Sign    Worried About Running Out of Food in the Last Year: Never true    Ran Out of Food in the Last Year: Never true  Transportation Needs: No Transportation Needs (03/21/2022)   PRAPARE - Hydrologist (Medical): No    Lack of Transportation (Non-Medical): No  Physical Activity: Sufficiently Active (03/21/2022)   Exercise Vital Sign    Days of Exercise per Week: 7 days    Minutes of Exercise per Session: 40 min  Stress: No Stress Concern Present (03/21/2022)   [place]    Feeling of Stress : Not at all  Social Connections: Moderately Integrated (03/21/2022)   Social Connection and Isolation Panel [NHANES]    Frequency of Communication with Friends and Family: More than three times a week    Frequency of Social Gatherings with Friends and  Family: More than three times a week    Attends Religious Services: Never    Marine scientist or Organizations: Yes    Attends Archivist Meetings: 1 to 4 times per year    Marital Status: Living with partner    Tobacco Counseling Ready to quit: Not Answered Counseling given: Not Answered Tobacco comments: 03/21/2018 cut back to 1/2 ppd   Clinical Intake:  Pre-visit preparation completed: Yes  Pain : No/denies pain     Nutritional Risks: None Diabetes: Yes CBG done?: No Did  pt. bring in CBG monitor from home?: No  How often do you need to have someone help you when you read instructions, pamphlets, or other written materials from your doctor or pharmacy?: 1 - Never What is the last grade level you completed in school?: bachelor's degree  Diabetic?  Yes Nutrition Risk Assessment:  Has the patient had any N/V/D within the last 2 months?  No  Does the patient have any non-healing wounds?  No  Has the patient had any unintentional weight loss or weight gain?  No   Diabetes:  Is the patient diabetic?  Yes  If diabetic, was a CBG obtained today?   N/A Did the patient bring in their glucometer from home?   N/A How often do you monitor your CBG's? Three times a week   Financial Strains and Diabetes Management:  Are you having any financial strains with the device, your supplies or your medication? No .  Does the patient want to be seen by Chronic Care Management for management of their diabetes?  No  Would the patient like to be referred to a Nutritionist or for Diabetic Management?  No   Diabetic Exams:  Diabetic Eye Exam: Completed May 2023 Diabetic Foot Exam:  Due   Interpreter Needed?: No  Information entered by :: Felicity Coyer LPN   Activities of Daily Living    03/21/2022    2:56 PM  In your present state of health, do you have any difficulty performing the following activities:  Hearing? 0  Vision? 1  Comment decreased vision in right eye   Difficulty concentrating or making decisions? 0  Walking or climbing stairs? 0  Dressing or bathing? 0  Doing errands, shopping? 0  Preparing Food and eating ? N  Using the Toilet? N  In the past six months, have you accidently leaked urine? N  Do you have problems with loss of bowel control? N  Managing your Medications? N  Managing your Finances? N  Housekeeping or managing your Housekeeping? N    Patient Care Team: Claretta Fraise, MD as PCP - General (Family Medicine) Lenna Sciara, MD as Referring Physician (Gastroenterology) Izora Gala, MD as Consulting Physician (Otolaryngology) Celestia Khat, [place] (Optometry) Festus Aloe, MD as Consulting Physician (Urology)  Indicate any recent Medical Services you may have received from other than [facility] providers in the past year (date may be approximate).     Assessment:   This is a routine wellness examination for Vash.  Hearing/Vision screen No results found.  Dietary issues and exercise activities discussed: Current Exercise Habits: The patient does not participate in regular exercise at present, Exercise limited by: None identified   Goals Addressed             This Visit's Progress    Patient Stated       03/21/2022 AWV Goal: Exercise for General Health  Patient will verbalize understanding of the benefits of increased physical activity: Exercising regularly is important. It will improve your overall fitness, flexibility, and endurance. Regular exercise also will improve your overall health. It can help you control your weight, reduce stress, and improve your bone density. Over the next year, patient will increase physical activity as tolerated with a goal of at least 150 minutes of moderate physical activity per week.  You can tell that you are exercising at a moderate intensity if your heart starts beating faster and you start breathing faster but can still hold a conversation. Moderate-intensity exercise  ideas include: Walking  1 mile (1.6 km) in about 15 minutes Biking Hiking Golfing Dancing Water aerobics Patient will verbalize understanding of everyday activities that increase physical activity by providing examples like the following: Yard work, such as: Sales promotion account executive Gardening Washing windows or floors Patient will be able to explain general safety guidelines for exercising:  Before you start a new exercise program, talk with your health care provider. Do not exercise so much that you hurt yourself, feel dizzy, or get very short of breath. Wear comfortable clothes and wear shoes with good support. Drink plenty of water while you exercise to prevent dehydration or heat stroke. Work out until your breathing and your heartbeat get faster.        Depression Screen    03/21/2022    2:54 PM 03/16/2022    1:36 PM 03/16/2022    1:26 PM 11/08/2021    8:21 AM 08/03/2021    3:08 PM 08/03/2021    3:00 PM 05/04/2021    3:00 PM  PHQ 2/9 Scores  PHQ - 2 Score 0 0 0 2 2 0 0  PHQ- 9 Score  '2  4 4  3    '$ Fall Risk    03/21/2022    3:01 PM 03/16/2022    1:36 PM 03/16/2022    1:26 PM 08/03/2021    3:00 PM 05/04/2021    2:56 PM  [place] in the past year? 1 1 0 0 0  Number falls in past yr: 0 0     Injury with Fall? 0 0     Risk for fall due to : History of fall(s) History of fall(s)     Follow up Falls evaluation completed Falls evaluation completed       [place]:  Any stairs in or around the home? Yes  If so, are there any without handrails? No  Home free of loose throw rugs in walkways, pet beds, electrical cords, etc? Yes  Adequate lighting in your home to reduce risk of falls? Yes   ASSISTIVE DEVICES UTILIZED TO PREVENT FALLS:  Life alert? No  Use of a cane, walker or w/c? No  Grab bars in the bathroom? No  Shower chair or bench in shower? No   Elevated toilet seat or a handicapped toilet? No   TIMED UP AND GO:  Was the test performed? No .    Cognitive Function:    03/20/2017    2:49 PM  MMSE - Mini Mental State Exam  Orientation to time 5  Orientation to Place 5  Registration 3  Attention/ Calculation 5  Recall 3  Language- name 2 objects 2  Language- repeat 1  Language- follow 3 step command 3  Language- read & follow direction 1  Write a sentence 1  Copy design 1  Total score 30        03/21/2022    2:58 PM 03/17/2021    3:50 PM 03/21/2018    2:52 PM  6CIT Screen  What Year? 0 points 0 points 0 points  What month? 0 points 0 points 0 points  What time? 0 points 0 points 0 points  Count back from 20 0 points 0 points 0 points  Months in reverse 0 points 0 points 0 points  Repeat phrase 0 points 0 points 0 points  Total Score 0 points 0 points 0 points  Immunizations Immunization History  Administered Date(s) Administered   Hepatitis B, adult 04/26/2018   Influenza,inj,Quad PF,6+ Mos 06/19/2014, 03/26/2015, 02/25/2016, 03/20/2017, 03/21/2018, 03/04/2019, 04/29/2020, 02/01/2021, 03/16/2022   PFIZER(Purple Top)SARS-COV-2 Vaccination 08/01/2019, 08/26/2019, 04/10/2020   Pneumococcal Conjugate-13 11/03/2019   Pneumococcal Polysaccharide-23 03/20/2017   Zoster Recombinat (Shingrix) 08/03/2021, 11/08/2021    TDAP status: Up to date  Flu Vaccine status: Up to date  Pneumococcal vaccine status: Up to date  Covid-19 vaccine status: Completed vaccines  Qualifies for Shingles Vaccine? Yes   Zostavax completed No   Shingrix Completed?: Yes  Screening Tests Health Maintenance  Topic Date Due   OPHTHALMOLOGY EXAM  12/16/2021   FOOT EXAM  02/01/2022   Medicare Annual Wellness (AWV)  03/17/2022   COVID-19 Vaccine (4 - Pfizer risk series) 04/01/2022 (Originally 06/05/2020)   TETANUS/TDAP  08/24/2022   HEMOGLOBIN A1C  09/14/2022   Diabetic kidney evaluation - GFR measurement  03/17/2023   Diabetic  kidney evaluation - Urine ACR  03/17/2023   COLONOSCOPY (Pts 45-35yr Insurance coverage will need to be confirmed)  09/22/2025   INFLUENZA VACCINE  Completed   Hepatitis C Screening  Completed   HIV Screening  Completed   Zoster Vaccines- Shingrix  Completed   HPV VACCINES  Aged Out    Health Maintenance  Health Maintenance Due  Topic Date Due   OPHTHALMOLOGY EXAM  12/16/2021   FOOT EXAM  02/01/2022   Medicare Annual Wellness (AWV)  03/17/2022    Colorectal cancer screening: Type of screening: Colonoscopy. Completed 09/22/2020. Repeat every two years  Lung Cancer Screening: (Low Dose CT Chest recommended if Age 62-80years, 30 pack-year currently smoking OR have quit w/in 15years.) does qualify.   Additional Screening:  Hepatitis C Screening: does not qualify  Vision Screening: Recommended annual ophthalmology exams for early detection of glaucoma and other disorders of the eye. Is the patient up to date with their annual eye exam?  Yes  Who is the provider or what is the name of the office in which the patient attends annual eye exams? [facility] If pt is not established with a provider, would they like to be referred to a provider to establish care?  NA .   Dental Screening: Recommended annual dental exams for proper oral hygiene  Community Resource Referral / Chronic Care Management: CRR required this visit?  No   CCM required this visit?  No      Plan:     I have personally reviewed and noted the following in the patient's chart:   Medical and social history Use of alcohol, tobacco or illicit drugs  Current medications and supplements including opioid prescriptions. Patient is not currently taking opioid prescriptions. Functional ability and status Nutritional status Physical activity Advanced directives List of other physicians Hospitalizations, surgeries, and ER visits in previous 12 months Vitals Screenings to include cognitive, depression,  and falls Referrals and appointments  In addition, I have reviewed and discussed with patient certain preventive protocols, quality metrics, and best practice recommendations. A written personalized care plan for preventive services as well as general preventive health recommendations were provided to patient.     JFelicity CoyerLPN     [id number]  Nurse Notes: Low dose CT scan recommended for smoker, due for foot exam

## 2022-04-12 IMAGING — CT CT CHEST LUNG CANCER SCREENING LOW DOSE W/O CM
1 of 2 series · 10 of 20 positions shown, 13 images · non-contrast
Comparison: CT chest dated 05/23/2019

CLINICAL DATA: 59-year-old male current smoker, with 47 pack-year
history of smoking, for initial lung cancer screening

EXAM:
CT CHEST WITHOUT CONTRAST LOW-DOSE FOR LUNG CANCER SCREENING
TECHNIQUE: Multidetector CT imaging of the chest was performed following the
standard protocol without IV contrast.

[ct lung segmentation data · axial · 0.69mm/px · z∈[+1210,+1210]mm · 10 of 311 frames shown]
[frame 1/311  mediastinal]
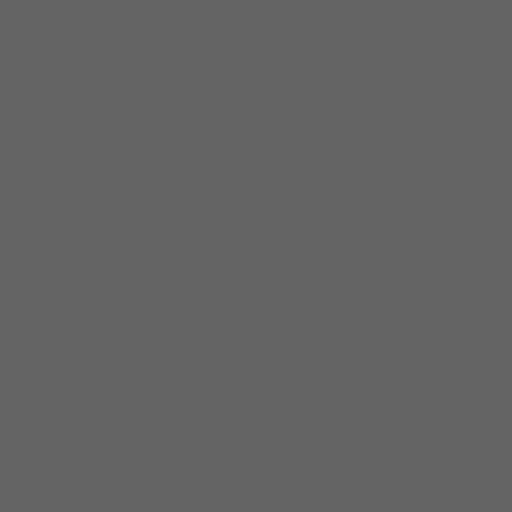
[frame 1/311  lung]
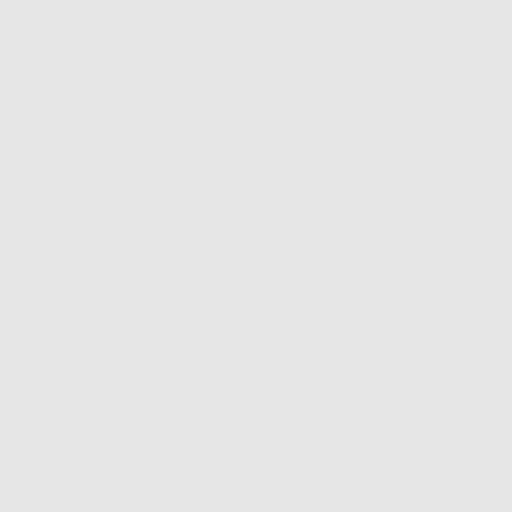
[frame 35/311  lung]
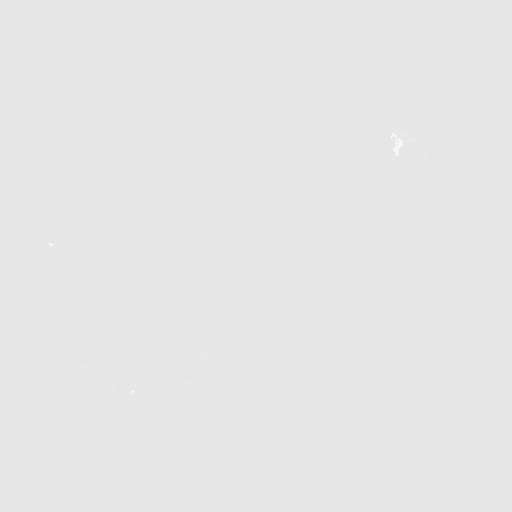
[frame 69/311  lung]
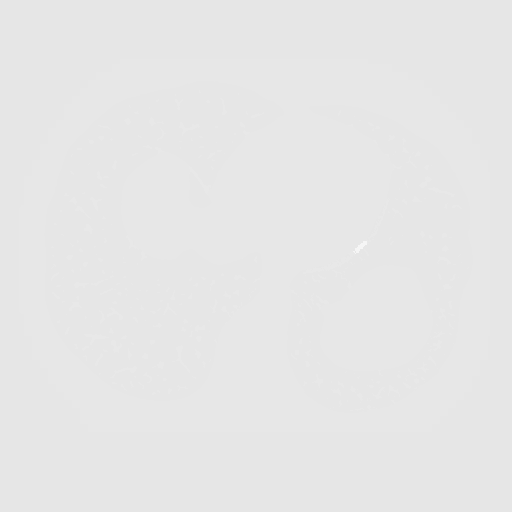
[frame 104/311  lung]
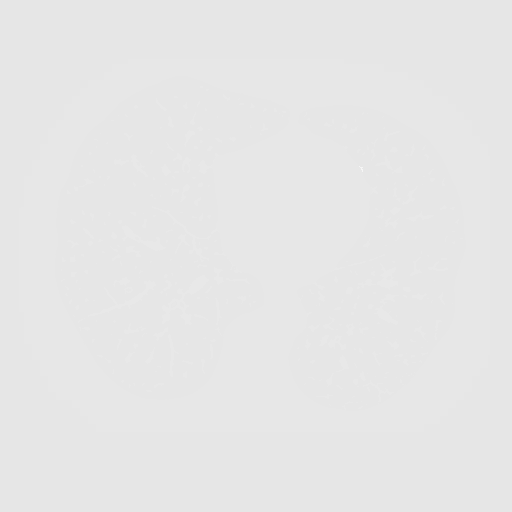
[frame 138/311  mediastinal]
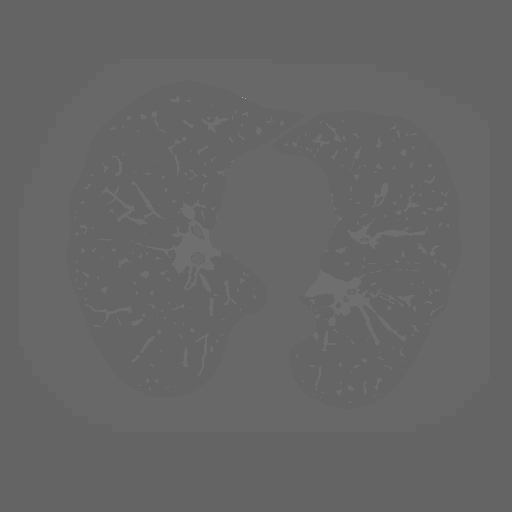
[frame 138/311  lung]
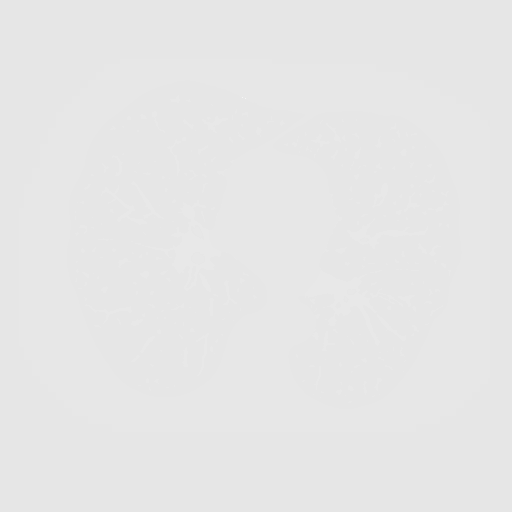
[frame 173/311  lung]
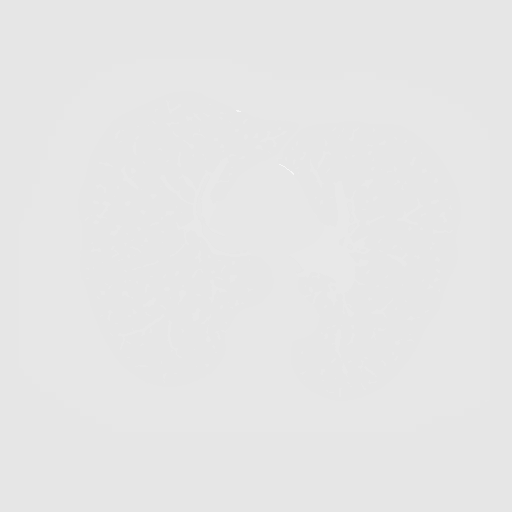
[frame 207/311  lung]
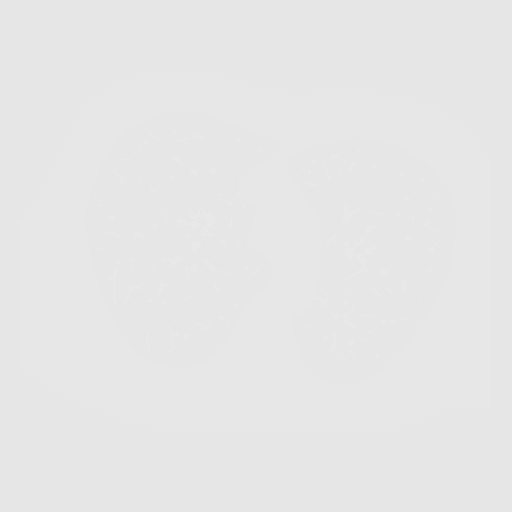
[frame 242/311  lung]
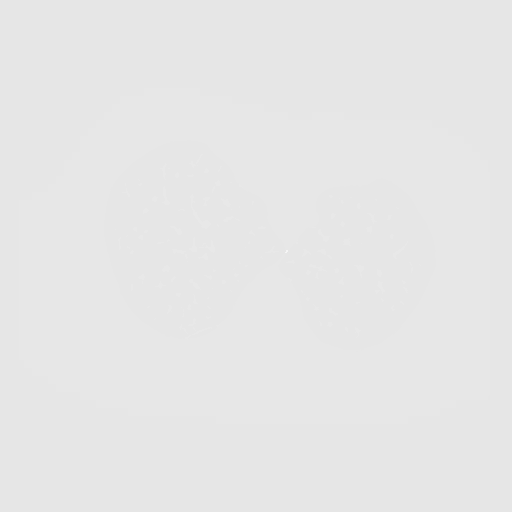
[frame 276/311  mediastinal]
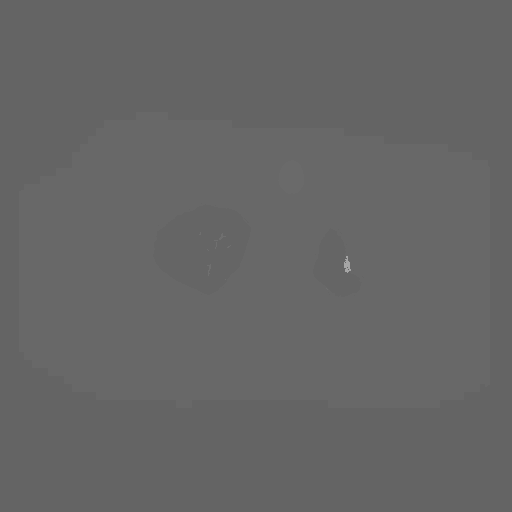
[frame 276/311  lung]
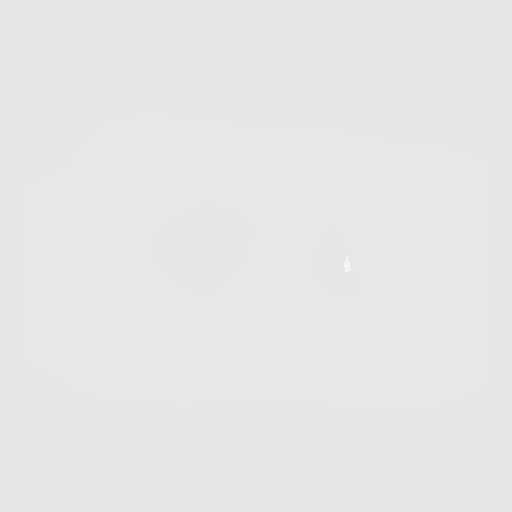
[frame 311/311  lung]
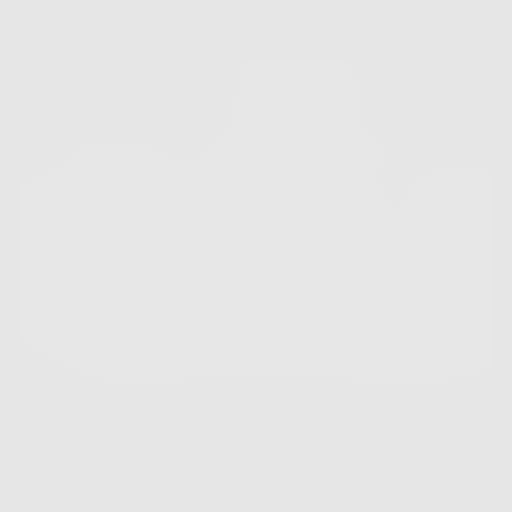

[10 of 20 positions shown; findings below may reference images not displayed]

FINDINGS: Cardiovascular: Heart is normal in size.  No pericardial effusion.

No evidence of thoracic aortic aneurysm. Atherosclerotic
calcifications of the aortic arch.

Coronary atherosclerosis of the LAD and left circumflex.

Mediastinum/Nodes: No suspicious mediastinal lymphadenopathy.

Visualized thyroid is unremarkable.

Lungs/Pleura: Mild centrilobular and paraseptal emphysematous
changes, upper lung predominant.

No focal consolidation.

No suspicious pulmonary nodules.

No pleural effusion or pneumothorax.

Upper Abdomen: Visualized upper abdomen is notable for cirrhosis
with indwelling tips. Mild vascular calcifications.

Musculoskeletal: Visualized osseous structures are within normal
limits.
IMPRESSION: Lung-RADS 1, negative. Continue annual screening with low-dose chest
CT without contrast in 12 months.

Aortic Atherosclerosis (2ENAS-E2L.L) and Emphysema (2ENAS-46I.J).

## 2022-04-26 ENCOUNTER — Other Ambulatory Visit: Payer: Self-pay | Admitting: Family Medicine

## 2022-05-07 ENCOUNTER — Other Ambulatory Visit: Payer: Self-pay | Admitting: Family Medicine

## 2022-05-26 NOTE — Progress Notes (Signed)
Attempted to call patient to schedule follow-up LDCT. Unable to reach the patient directly at this time, detailed message left with the patient's significant other asking that the patient return my call.

## 2022-06-20 DIAGNOSIS — B351 Tinea unguium: Secondary | ICD-10-CM | POA: Diagnosis not present

## 2022-06-20 DIAGNOSIS — M79676 Pain in unspecified toe(s): Secondary | ICD-10-CM | POA: Diagnosis not present

## 2022-06-29 ENCOUNTER — Other Ambulatory Visit: Payer: Self-pay

## 2022-06-29 DIAGNOSIS — Z122 Encounter for screening for malignant neoplasm of respiratory organs: Secondary | ICD-10-CM

## 2022-06-29 DIAGNOSIS — Z87891 Personal history of nicotine dependence: Secondary | ICD-10-CM

## 2022-06-29 NOTE — Progress Notes (Signed)
LDCT order placed per protocol. Scan scheduled for 08/04/2022 at 1:30p

## 2022-07-06 ENCOUNTER — Other Ambulatory Visit: Payer: Self-pay | Admitting: Family Medicine

## 2022-07-25 DIAGNOSIS — K7581 Nonalcoholic steatohepatitis (NASH): Secondary | ICD-10-CM | POA: Diagnosis not present

## 2022-07-25 DIAGNOSIS — K7460 Unspecified cirrhosis of liver: Secondary | ICD-10-CM | POA: Diagnosis not present

## 2022-07-25 DIAGNOSIS — R161 Splenomegaly, not elsewhere classified: Secondary | ICD-10-CM | POA: Diagnosis not present

## 2022-07-25 DIAGNOSIS — K7290 Hepatic failure, unspecified without coma: Secondary | ICD-10-CM | POA: Diagnosis not present

## 2022-07-27 ENCOUNTER — Encounter: Payer: Self-pay | Admitting: *Deleted

## 2022-08-04 ENCOUNTER — Ambulatory Visit (HOSPITAL_COMMUNITY)
Admission: RE | Admit: 2022-08-04 | Discharge: 2022-08-04 | Disposition: A | Discharge status: Home / Self Care | Payer: Medicare HMO | Source: Ambulatory Visit | Attending: Physician Assistant | Admitting: Physician Assistant

## 2022-08-04 DIAGNOSIS — F17210 Nicotine dependence, cigarettes, uncomplicated: Secondary | ICD-10-CM | POA: Diagnosis not present

## 2022-08-04 DIAGNOSIS — Z87891 Personal history of nicotine dependence: Secondary | ICD-10-CM | POA: Insufficient documentation

## 2022-08-04 DIAGNOSIS — Z122 Encounter for screening for malignant neoplasm of respiratory organs: Secondary | ICD-10-CM | POA: Insufficient documentation

## 2022-08-18 NOTE — Progress Notes (Signed)
Patient notified of LDCT Lung Cancer Screening Results via mail with the recommendation to follow-up in 12 months. Patient's referring provider has been sent a copy of results. Results are as follows:  IMPRESSION: 1. Lung-RADS 1, negative. Continue annual screening with low-dose chest CT without contrast in 12 months. 2. Cirrhosis. 3. Cholelithiasis. 4. Aortic atherosclerosis (ICD10-I70.0). Coronary artery calcification. 5.  Emphysema (ICD10-J43.9).

## 2022-08-30 ENCOUNTER — Other Ambulatory Visit: Payer: Self-pay | Admitting: *Deleted

## 2022-08-30 MED ORDER — ACCU-CHEK AVIVA PLUS VI STRP: ORAL_STRIP | 3 refills | Status: DC

## 2022-09-08 ENCOUNTER — Other Ambulatory Visit: Payer: Self-pay

## 2022-09-08 ENCOUNTER — Telehealth: Payer: Self-pay

## 2022-09-08 MED ORDER — TAMSULOSIN HCL 0.4 MG PO CAPS: 0.4000 mg | ORAL_CAPSULE | Freq: Every day | ORAL | 0 refills | Status: DC

## 2022-09-08 NOTE — Telephone Encounter (Signed)
90 day rx for Tamsulosin sent to pharmacy.  Patient will need 1 year follow up for future refills.  Follow up scheduled for 06/17, will confirm with pt via phone call, mychart not active since 2022

## 2022-09-11 NOTE — Telephone Encounter (Signed)
Patient will keep apt for 06/17

## 2022-09-12 ENCOUNTER — Ambulatory Visit: Payer: Medicare HMO | Admitting: Family Medicine

## 2022-09-14 ENCOUNTER — Other Ambulatory Visit: Payer: Self-pay | Admitting: Family Medicine

## 2022-09-14 ENCOUNTER — Ambulatory Visit: Payer: Medicare HMO | Admitting: Family Medicine

## 2022-10-16 ENCOUNTER — Ambulatory Visit (INDEPENDENT_AMBULATORY_CARE_PROVIDER_SITE_OTHER): Payer: Medicare HMO | Admitting: Family Medicine

## 2022-10-16 ENCOUNTER — Encounter: Payer: Self-pay | Admitting: Family Medicine

## 2022-10-16 VITALS — BP 134/73 | HR 66 | Temp 98.1°F | Ht 70.0 in | Wt 156.2 lb

## 2022-10-16 DIAGNOSIS — R2689 Other abnormalities of gait and mobility: Secondary | ICD-10-CM | POA: Diagnosis not present

## 2022-10-16 DIAGNOSIS — E782 Mixed hyperlipidemia: Secondary | ICD-10-CM | POA: Diagnosis not present

## 2022-10-16 DIAGNOSIS — K7030 Alcoholic cirrhosis of liver without ascites: Secondary | ICD-10-CM

## 2022-10-16 DIAGNOSIS — E119 Type 2 diabetes mellitus without complications: Secondary | ICD-10-CM | POA: Diagnosis not present

## 2022-10-16 LAB — BAYER DCA HB A1C WAIVED: HB A1C (BAYER DCA - WAIVED): 5.7 % — ABNORMAL HIGH (ref 4.8–5.6)

## 2022-10-16 NOTE — Progress Notes (Signed)
Subjective:  Patient ID: Spencer Stanley,  male    DOB: 09-15-1960  Age: 62 y.o.    CC: Medical Management of Chronic Issues   HPI Spencer Stanley presents for lack of balance. Works as a Electrical engineer. He stumbles and staggers so much he is afraid he will get fired. He hasn't fallen, but feels he could. Has cirrhosis. Stable recently. No ascites noted. Still seeing GI, Dr. Carrie Mew. Ammonia level due today. No diarrhea as long as he takes xifaxan and lactulose.   Patient also  in for follow-up of elevated cholesterol. Doing well without complaints on current medication. Denies side effects  including myalgia and arthralgia and nausea. Also in today for liver function testing. Currently no chest pain, shortness of breath or other cardiovascular related symptoms noted.  Follow-up of diabetes. Patient does not check blood sugar at home. Patient denies symptoms such as excessive hunger or urinary frequency, excessive hunger, nausea No significant hypoglycemic spells note. Medications reviewed. Pt reports taking them regularly. Pt. denies complication/adverse reaction today.    History Spencer Stanley has a past medical history of Anxiety, Cirrhosis (HCC), Cirrhosis of liver (HCC), Diabetes mellitus without complication (HCC), Esophageal varices (HCC), High cholesterol, Lyme disease, and [facility] spotted fever.   He has a past surgical history that includes Hernia repair and Esophageal varice ligation.   His family history includes Aneurysm in his mother; Cancer in his father and mother; Dementia in his father; Diabetes in his sister; Heart failure in his father.He reports that he has been smoking cigarettes. He has a 16.50 pack-year smoking history. He quit smokeless tobacco use about 43 years ago.  His smokeless tobacco use included chew. He reports that he does not drink alcohol and does not use drugs.  Current Outpatient Medications on File Prior to Visit  Medication Sig Dispense Refill   Accu-Chek  Softclix Lancets lancets TEST BLOOD SUGAR THREE TIMES DAILY Dx E11.9 300 each 3   Alcohol Swabs (DROPSAFE ALCOHOL PREP) 70 % PADS TEST BLOOD SUGAR THREE TIMES DAILY Dx E11.9 300 each 3   atorvastatin (LIPITOR) 10 MG tablet Take 1 tablet (10 mg total) by mouth daily. 90 tablet 3   Blood Glucose Calibration (ACCU-CHEK AVIVA) SOLN Use with Accu-Chek meter 1 each 0   CALCIUM-MAGNESIUM-ZINC PO Take 1 capsule by mouth 2 (two) times daily.     ciclopirox (PENLAC) 8 % solution Apply topically at bedtime. Apply over nail and surrounding skin. Apply daily over previous coat. After seven (7) days, may remove with alcohol and continue cycle. 39.6 mL 3   finasteride (PROSCAR) 5 MG tablet Take 1 tablet (5 mg total) by mouth daily. 90 tablet 3   FLUoxetine (PROZAC) 20 MG capsule Take 1 capsule (20 mg total) by mouth daily. 90 capsule 3   glucose blood (ACCU-CHEK AVIVA PLUS) test strip CHECK BLOOD SUGAR THREE TIMES DAILY Dx E11.9 300 strip 3   lactulose (CHRONULAC) 10 GM/15ML solution Take 45 g by mouth 3 (three) times daily.     Multiple Vitamins-Minerals (ZINC PO) Take by mouth.     multivitamin (ONE-A-DAY MEN'S) TABS tablet Take 1 tablet by mouth daily.     pantoprazole (PROTONIX) 40 MG tablet TAKE 1 TABLET EVERY DAY 90 tablet 0   rifaximin (XIFAXAN) 550 MG TABS tablet Take 1 tablet (550 mg total) by mouth 2 (two) times daily. 60 tablet 5   sildenafil (REVATIO) 20 MG tablet Take 2-5 pills at once, orally, with each sexual encounter 20 tablet 0  spironolactone (ALDACTONE) 25 MG tablet TAKE 1 TABLET EVERY DAY 90 tablet 1   tamsulosin (FLOMAX) 0.4 MG CAPS capsule Take 1 capsule (0.4 mg total) by mouth daily after supper. 90 capsule 0   zinc sulfate 220 (50 Zn) MG capsule Take 1 capsule by mouth daily.     No current facility-administered medications on file prior to visit.    ROS Review of Systems  Constitutional: Negative.   HENT: Negative.    Eyes:  Negative for visual disturbance.  Respiratory:   Negative for cough and shortness of breath.   Cardiovascular:  Negative for chest pain and leg swelling.  Gastrointestinal:  Negative for abdominal pain, diarrhea, nausea and vomiting.  Genitourinary:  Negative for difficulty urinating.  Musculoskeletal:  Negative for arthralgias and myalgias.  Skin:  Negative for rash.  Neurological:  Negative for headaches.  Psychiatric/Behavioral:  Negative for sleep disturbance.     Objective:  BP 134/73   Pulse 66   Temp 98.1 F (36.7 C)   Ht 5\' 10"  (1.778 m)   Wt 156 lb 3.2 oz (70.9 kg)   SpO2 100%   BMI 22.41 kg/m   BP Readings from Last 3 Encounters:  10/16/22 134/73  03/16/22 (!) 89/56  11/08/21 140/78    Wt Readings from Last 3 Encounters:  10/16/22 156 lb 3.2 oz (70.9 kg)  03/16/22 149 lb 6.4 oz (67.8 kg)  11/08/21 168 lb 3.2 oz (76.3 kg)     Physical Exam Vitals reviewed.  Constitutional:      Appearance: He is well-developed.  HENT:     Head: Normocephalic and atraumatic.     Right Ear: External ear normal.     Left Ear: External ear normal.     Mouth/Throat:     Pharynx: No oropharyngeal exudate or posterior oropharyngeal erythema.  Eyes:     Pupils: Pupils are equal, round, and reactive to light.  Cardiovascular:     Rate and Rhythm: Normal rate and regular rhythm.     Heart sounds: No murmur heard. Pulmonary:     Effort: No respiratory distress.     Breath sounds: Normal breath sounds.  Musculoskeletal:     Cervical back: Normal range of motion and neck supple.  Neurological:     Mental Status: He is alert and oriented to person, place, and time.     Diabetic Foot Exam - Simple   No data filed     Lab Results  Component Value Date   HGBA1C 5.4 03/16/2022   HGBA1C 6.4 (H) 11/08/2021   HGBA1C 6.5 (H) 08/03/2021    Assessment & Plan:   Spencer Stanley was seen today for medical management of chronic issues.  Diagnoses and all orders for this visit:  Mixed hyperlipidemia -     Lipid panel  Type 2  diabetes mellitus without complication, without long-term current use of insulin (HCC) -     Bayer DCA Hb A1c Waived -     CBC with Differential/Platelet -     CMP14+EGFR  Alcoholic cirrhosis of liver without ascites (HCC) -     Ammonia   I am having Spencer Stanley maintain his multivitamin, CALCIUM-MAGNESIUM-ZINC PO, rifaximin, Multiple Vitamins-Minerals (ZINC PO), lactulose, Accu-Chek Aviva, zinc sulfate, sildenafil, finasteride, ciclopirox, Accu-Chek Softclix Lancets, DropSafe Alcohol Prep, atorvastatin, FLUoxetine, spironolactone, Accu-Chek Aviva Plus, tamsulosin, and pantoprazole.  No orders of the defined types were placed in this encounter.    Follow-up: Return in about 6 months (around 04/17/2023).  Mechele Claude, M.D.

## 2022-10-17 LAB — CBC WITH DIFFERENTIAL/PLATELET
Basophils Absolute: 0.1 10*3/uL (ref 0.0–0.2)
Basos: 1 %
EOS (ABSOLUTE): 0.2 10*3/uL (ref 0.0–0.4)
Eos: 5 %
Hematocrit: 37.1 % — ABNORMAL LOW (ref 37.5–51.0)
Hemoglobin: 11.7 g/dL — ABNORMAL LOW (ref 13.0–17.7)
Immature Grans (Abs): 0 10*3/uL (ref 0.0–0.1)
Immature Granulocytes: 0 %
Lymphocytes Absolute: 1 10*3/uL (ref 0.7–3.1)
Lymphs: 20 %
MCH: 27.1 pg (ref 26.6–33.0)
MCHC: 31.5 g/dL (ref 31.5–35.7)
MCV: 86 fL (ref 79–97)
Monocytes Absolute: 0.5 10*3/uL (ref 0.1–0.9)
Monocytes: 10 %
Neutrophils Absolute: 3.2 10*3/uL (ref 1.4–7.0)
Neutrophils: 64 %
Platelets: 75 10*3/uL — CL (ref 150–450)
RBC: 4.32 x10E6/uL (ref 4.14–5.80)
RDW: 16.2 % — ABNORMAL HIGH (ref 11.6–15.4)
WBC: 5 10*3/uL (ref 3.4–10.8)

## 2022-10-17 LAB — CMP14+EGFR
ALT: 15 IU/L (ref 0–44)
AST: 24 IU/L (ref 0–40)
Albumin/Globulin Ratio: 1.5 (ref 1.2–2.2)
Albumin: 3.9 g/dL (ref 3.9–4.9)
Alkaline Phosphatase: 107 IU/L (ref 44–121)
BUN/Creatinine Ratio: 16 (ref 10–24)
BUN: 14 mg/dL (ref 8–27)
Bilirubin Total: 0.5 mg/dL (ref 0.0–1.2)
CO2: 20 mmol/L (ref 20–29)
Calcium: 9.4 mg/dL (ref 8.6–10.2)
Chloride: 106 mmol/L (ref 96–106)
Creatinine, Ser: 0.89 mg/dL (ref 0.76–1.27)
Globulin, Total: 2.6 g/dL (ref 1.5–4.5)
Glucose: 161 mg/dL — ABNORMAL HIGH (ref 70–99)
Potassium: 4 mmol/L (ref 3.5–5.2)
Sodium: 140 mmol/L (ref 134–144)
Total Protein: 6.5 g/dL (ref 6.0–8.5)
eGFR: 97 mL/min/{1.73_m2} (ref >59)

## 2022-10-17 LAB — LIPID PANEL
Chol/HDL Ratio: 2.7 ratio (ref 0.0–5.0)
Cholesterol, Total: 117 mg/dL (ref 100–199)
HDL: 44 mg/dL (ref >39)
LDL Chol Calc (NIH): 57 mg/dL (ref 0–99)
Triglycerides: 82 mg/dL (ref 0–149)
VLDL Cholesterol Cal: 16 mg/dL (ref 5–40)

## 2022-10-17 LAB — AMMONIA: Ammonia: 88 ug/dL (ref 40–200)

## 2022-10-17 NOTE — Progress Notes (Signed)
Hello [place],  Your lab result is normal and/or stable.Some minor variations that are not significant are commonly marked abnormal, but do not represent any medical problem for you.  Best regards, Anabia Weatherwax, M.D.

## 2022-10-25 ENCOUNTER — Other Ambulatory Visit: Payer: Self-pay

## 2022-10-25 ENCOUNTER — Ambulatory Visit: Payer: Medicare HMO | Attending: Family Medicine

## 2022-10-25 DIAGNOSIS — M6281 Muscle weakness (generalized): Secondary | ICD-10-CM | POA: Insufficient documentation

## 2022-10-25 DIAGNOSIS — R2689 Other abnormalities of gait and mobility: Secondary | ICD-10-CM | POA: Insufficient documentation

## 2022-10-25 NOTE — Therapy (Addendum)
 OUTPATIENT PHYSICAL THERAPY LOWER EXTREMITY EVALUATION   Patient Name: Spencer Stanley MRN: [id number] DOB:Jun 11, 1960, 62 y.o., male Today's Date: 10/25/2022  END OF SESSION:  PT End of Session - 10/25/22 1344     Visit Number 1    Number of Visits 6    Date for PT Re-Evaluation 01/12/23    PT Start Time 1346    PT Stop Time 1412    PT Time Calculation (min) 26 min    Activity Tolerance Patient tolerated treatment well    Behavior During Therapy [facility] for tasks assessed/performed             Past Medical History:  Diagnosis Date   Anxiety    Cirrhosis (HCC)    Cirrhosis of liver (HCC)    Diabetes mellitus without complication (HCC)    Esophageal varices (HCC)    High cholesterol    Lyme disease    [place] spotted fever    Past Surgical History:  Procedure Laterality Date   ESOPHAGEAL VARICE LIGATION     TIPS   HERNIA REPAIR     Patient Active Problem List   Diagnosis Date Noted   Hematochezia 06/27/2020   Tobacco dependence 12/24/2018   Alcoholic cirrhosis of liver without ascites (HCC) 03/21/2018   Anemia 03/21/2018   Esophageal varices in alcoholic cirrhosis (HCC) 03/21/2018   Soreness of tongue 01/23/2017   Elevated lipase 11/11/2015   Hyperammonemia (HCC) 11/11/2015   Thrombocytopenia (HCC) 11/11/2015   Depression 07/16/2015   GERD (gastroesophageal reflux disease) 07/16/2015   Type 2 diabetes mellitus (HCC) 07/16/2015   Encephalopathy, portal systemic (HCC) 06/06/2015   Decompensated hepatic cirrhosis (HCC) 06/06/2015   Type 2 diabetes mellitus without complication (HCC) 12/22/2014   Hyperlipemia 12/22/2014   Cirrhosis (HCC) 09/26/2013   REFERRING PROVIDER: Roise Cleaver, MD   REFERRING DIAG: Impairment of balance   THERAPY DIAG:  Muscle weakness (generalized)  Other abnormalities of gait and mobility  Rationale for Evaluation and Treatment: Rehabilitation  ONSET DATE: 10 years ago  SUBJECTIVE:   SUBJECTIVE STATEMENT: Patient reports  that he has been having trouble with his balance over the past 10 years since he was diagnosed with cirrhosis of the liver. He has noticed that he "walks like I am drunk." He feels like it has slowly been getting worse since he first noticed it. He has fallen twice in the past six months with one of these fall occurring after he stood up and lost his balance then fell. His other fall occurred while he was playing volleyball at a family reunion and he could not hit the ball without falling down.   PERTINENT HISTORY: Cirrhosis of the liver, diabetes, depression, anxiety, and current smoker PAIN:  Are you having pain? No  PRECAUTIONS: Fall  WEIGHT BEARING RESTRICTIONS: No  FALLS:  Has patient fallen in last 6 months? Yes. Number of falls 2  LIVING ENVIRONMENT: Lives in: House/apartment Has following equipment at home: None  OCCUPATION: part time security guard  PLOF: Independent  PATIENT GOALS: improved balance, play volleyball without falling, and be able to walk better  NEXT MD VISIT: 04/17/23  OBJECTIVE:   COGNITION: Overall cognitive status: Within functional limits for tasks assessed     SENSATION: Patient reports no numbness or tingling  EDEMA:  No edema observed  LOWER EXTREMITY ROM: WFL for activities assessed  LOWER EXTREMITY MMT:  MMT Right eval Left eval  Hip flexion 4+/5 4+/5  Hip extension    Hip abduction    Hip  adduction    Hip internal rotation    Hip external rotation    Knee flexion 4+/5 4+/5  Knee extension 5/5 5/5  Ankle dorsiflexion 4+/5 +/5  Ankle plantarflexion    Ankle inversion    Ankle eversion     (Blank rows = not tested)  BALANCE: Narrow BOS, eyes open: 30 seconds Narrow BOS, eyes closed: 30 seconds with significant lateral sway Tandem: 6 seconds bilaterally   FUNCTIONAL TESTS:  5 times sit to stand: 19.00 seconds without upper extremity support Timed up and go (TUG): 10.57 seconds with increased unsteadiness turning around  cone  GAIT: Assistive device utilized: None Level of assistance: Complete Independence Comments: ataxic gait pattern   TODAY'S TREATMENT:                                                                                                                              DATE:     PATIENT EDUCATION:  Education details: POC, healing, prognosis, benefits of exercise, and goals for therapy Person educated: Patient Education method: Explanation Education comprehension: verbalized understanding  HOME EXERCISE PROGRAM:   ASSESSMENT:  CLINICAL IMPRESSION: Patient is a 62 y.o. male who was seen today for physical therapy evaluation and treatment for gait deviations and a history of falling.  He is at a high fall risk as evidenced by his objective measures, gait deviations, and his history of falling.  Recommend that he continue with skilled physical therapy to address his impairments to maximize his safety and functional mobility.  PHYSICAL THERAPY DISCHARGE SUMMARY  Visits from Start of Care: 1  Current functional level related to goals / functional outcomes: Patient is being discharged at this time as he did not return following his initial evaluation.    Remaining deficits: See evaluation    Education / Equipment: See patient education    Patient agrees to discharge. Patient goals were not met. Patient is being discharged due to not returning since the last visit.  Glendora Landsman, PT, DPT    OBJECTIVE IMPAIRMENTS: Abnormal gait, decreased activity tolerance, decreased balance, decreased mobility, difficulty walking, and decreased strength.   ACTIVITY LIMITATIONS: locomotion level  PARTICIPATION LIMITATIONS: community activity and occupation  PERSONAL FACTORS: Time since onset of injury/illness/exacerbation and 3+ comorbidities: Cirrhosis of the liver, diabetes, depression, anxiety, and current smoker  are also affecting patient's functional outcome.   REHAB POTENTIAL:  Good  CLINICAL DECISION MAKING: Evolving/moderate complexity  EVALUATION COMPLEXITY: Moderate   GOALS: Goals reviewed with patient? Yes  LONG TERM GOALS: Target date: 11/15/22 patient  Be independent with his initial HEP. Baseline:  Goal status: INITIAL  2.  Patient will improve his 5 times sit to stand time to 12 seconds or less for improved lower extremity power. Baseline:  Goal status: INITIAL  3.  Patient will improve his tandem stance to at least 15 seconds bilaterally for improved stability with a narrow base of support. Baseline:  Goal status: INITIAL  4.  Patient will be able to ambulate with no significant gait deviations for improved mobility. Baseline:  Goal status: INITIAL  PLAN:  PT FREQUENCY: 1-2x/week; patient reports that that he would only like to attend therapy once per week due to his insurance company  PT DURATION: 4 weeks  PLANNED INTERVENTIONS: Therapeutic exercises, Therapeutic activity, Neuromuscular re-education, Balance training, Gait training, Patient/Family education, Self Care, and Re-evaluation  PLAN FOR NEXT SESSION: Nustep, lower extremity strengthening, and balance interventions   Lane Pinon, PT 10/25/2022, 3:51 PM

## 2022-10-30 ENCOUNTER — Encounter: Payer: Self-pay | Admitting: Urology

## 2022-10-30 ENCOUNTER — Ambulatory Visit: Payer: Medicare HMO | Admitting: Urology

## 2022-10-30 VITALS — BP 112/68 | HR 81

## 2022-10-30 DIAGNOSIS — N138 Other obstructive and reflux uropathy: Secondary | ICD-10-CM | POA: Diagnosis not present

## 2022-10-30 DIAGNOSIS — N401 Enlarged prostate with lower urinary tract symptoms: Secondary | ICD-10-CM | POA: Diagnosis not present

## 2022-10-30 DIAGNOSIS — N5201 Erectile dysfunction due to arterial insufficiency: Secondary | ICD-10-CM

## 2022-10-30 DIAGNOSIS — R3911 Hesitancy of micturition: Secondary | ICD-10-CM

## 2022-10-30 DIAGNOSIS — N201 Calculus of ureter: Secondary | ICD-10-CM | POA: Diagnosis not present

## 2022-10-30 MED ORDER — TAMSULOSIN HCL 0.4 MG PO CAPS: 0.4000 mg | ORAL_CAPSULE | Freq: Two times a day (BID) | ORAL | 3 refills | Status: AC

## 2022-10-30 MED ORDER — FINASTERIDE 5 MG PO TABS: 5.0000 mg | ORAL_TABLET | Freq: Every day | ORAL | 3 refills | Status: AC

## 2022-10-30 NOTE — Progress Notes (Unsigned)
10/30/2022 3:06 PM   Spencer Stanley [birth year] [id number]  Referring provider: Mechele Claude, MD [address] [place],  [place] [address]  No chief complaint on file.   HPI: F/u -   1) gross hematuria-patient noted red urine Nov and Dec 2021. UA 12/21 showed greater than 30 red blood cells per high-powered field, urine cx no growth. He underwent an ultrasound 01/22 which was benign, possible 5 mm LLP (echogenic focus no shadowing). He is a smoker.  Cr 0.78. CT and cystoscopy benign 03/22. 06/22 renal US benign. He had one flash of blood in the urine again. UA with mod bac and 3-10 rbc. Rx'd 5ari June 2022, but didn't want to start.    2) BPH-prostate was 58 g on 01/22 renal/bladder ultrasound with some bladder wall thickening/trabeculation and about 50 g on CT 03/22. PSA 0.9 12/20. He has a weak stream, urgency and urge UI. No pad use. PVR was 13 ml and 31 ml. He started tamsulosin 02/22. Not much change in urgency. AUASS = 17, dissatisfied. Cysto 03/22, with lateral lobes - good 5ari, OAB med or PUL candidate. He continues to have some hesitancy. Rx'd 5ari June 2022 but pt declined due to risks of ED and the started taking it. AUASS was 16 on tams. He started finasteride.   His March 2023 PSA was 1.1. He forgot the tamsulosin. He noticed a weaker stream. AUASS = 14.    3) left proximal ureteral stone - possible 4-5 mm left proximal stone on CT 03/22. No hydro or other stones. Vascular calcifications also present. 06/22 renal US with no stone or hydro. KUB ordered not done. No flank pain.    He returns in management of the above.  On tams and finasteride. He has some urgency. He noticed a much better stream p tams but the effect wears off. No flannk pain or stone passge.   New issue - ED - trouble getting and maintaining erection for several years. Tried a sildenafil 20 mg.   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 His BP is kept low as he's had varices rupture. He stopped drinking. 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 His MELD is down to 8-9 - stable.    PMH: Past Medical History:  Diagnosis Date   Anxiety    Cirrhosis (HCC)    Cirrhosis of liver (HCC)    Diabetes mellitus without complication (HCC)    Esophageal varices (HCC)    High cholesterol    Lyme disease    [place] spotted fever     Surgical History: Past Surgical History:  Procedure Laterality Date   ESOPHAGEAL VARICE LIGATION     TIPS   HERNIA REPAIR      Home Medications:  Allergies as of 10/30/2022       Reactions   Other Other (See Comments)   Other reaction(s): Mental Status Changes (intolerance) After medicated induced coma patient seeing things that weren't there.         Medication List        Accurate as of October 30, 2022  3:06 PM. If you have any questions, ask your nurse or doctor.          Accu-Chek Aviva Plus test strip Generic drug: glucose blood CHECK BLOOD SUGAR THREE TIMES DAILY Dx E11.9   Accu-Chek Aviva Soln Use with Accu-Chek meter   Accu-Chek Softclix Lancets lancets TEST BLOOD SUGAR THREE TIMES DAILY Dx E11.9  atorvastatin 10 MG tablet Commonly known as: LIPITOR Take 1 tablet (10 mg total) by mouth daily.   CALCIUM-MAGNESIUM-ZINC PO Take 1 capsule by mouth 2 (two) times daily.   ciclopirox 8 % solution Commonly known as: Penlac Apply topically at bedtime. Apply over nail and surrounding skin. Apply daily over previous coat. After seven (7) days, may remove with alcohol and continue cycle.   DropSafe Alcohol Prep 70 % Pads TEST BLOOD SUGAR THREE TIMES DAILY Dx E11.9   finasteride 5 MG tablet Commonly known as: Proscar Take 1 tablet (5 mg total) by mouth daily.   FLUoxetine 20 MG capsule Commonly known as: PROZAC Take 1 capsule (20 mg total) by mouth daily.   lactulose 10 GM/15ML solution Commonly known as: CHRONULAC Take 45 g by mouth 3 (three) times daily.   multivitamin Tabs tablet Take 1 tablet by  mouth daily.   pantoprazole 40 MG tablet Commonly known as: PROTONIX TAKE 1 TABLET EVERY DAY   rifaximin 550 MG Tabs tablet Commonly known as: XIFAXAN Take 1 tablet (550 mg total) by mouth 2 (two) times daily.   sildenafil 20 MG tablet Commonly known as: REVATIO Take 2-5 pills at once, orally, with each sexual encounter   spironolactone 25 MG tablet Commonly known as: ALDACTONE TAKE 1 TABLET EVERY DAY   tamsulosin 0.4 MG Caps capsule Commonly known as: FLOMAX Take 1 capsule (0.4 mg total) by mouth daily after supper.   ZINC PO Take by mouth.   zinc sulfate 220 (50 Zn) MG capsule Take 1 capsule by mouth daily.        Allergies:  Allergies  Allergen Reactions   Other Other (See Comments)    Other reaction(s): Mental Status Changes (intolerance) After medicated induced coma patient seeing things that weren't there.     Family History: Family History  Problem Relation Age of Onset   Aneurysm Mother    Cancer Mother        breast   Dementia Father    Cancer Father        brain tumor   Heart failure Father    Diabetes Sister     Social History:  reports that he has been smoking cigarettes. He has a 16.50 pack-year smoking history. He quit smokeless tobacco use about 43 years ago.  His smokeless tobacco use included chew. He reports that he does not drink alcohol and does not use drugs.   Physical Exam: BP 112/68   Pulse 81   Constitutional:  Alert and oriented, No acute distress. HEENT: [place] AT, moist mucus membranes.  Trachea midline, no masses. Cardiovascular: No clubbing, cyanosis, or edema. Respiratory: Normal respiratory effort, no increased work of breathing. GI: Abdomen is soft, nontender, nondistended, no abdominal masses GU: No CVA tenderness Skin: No rashes, bruises or suspicious lesions. Neurologic: Grossly intact, no focal deficits, moving all 4 extremities. Psychiatric: Normal mood and affect.  Laboratory Data: Lab Results  Component Value  Date   WBC 5.0 10/16/2022   HGB 11.7 (L) 10/16/2022   HCT 37.1 (L) 10/16/2022   MCV 86 10/16/2022   PLT 75 (LL) 10/16/2022    Lab Results  Component Value Date   CREATININE 0.89 10/16/2022    No results found for: "PSA"  Lab Results  Component Value Date   TESTOSTERONE 913 06/20/2014    Lab Results  Component Value Date   HGBA1C 5.7 (H) 10/16/2022    Urinalysis    Component Value Date/Time   APPEARANCEUR Clear 10/31/2021 1104  GLUCOSEU Negative 10/31/2021 1104   BILIRUBINUR Negative 10/31/2021 1104   PROTEINUR Negative 10/31/2021 1104   NITRITE Negative 10/31/2021 1104   LEUKOCYTESUR 1+ (A) 10/31/2021 1104    Lab Results  Component Value Date   LABMICR 22.2 03/16/2022   WBCUA 5-10 (A) 10/31/2021   LABEPIT 0-10 10/31/2021   MUCUS Present 07/19/2020   BACTERIA Trace (A) 10/31/2021    Pertinent Imaging:  Results for orders placed during the hospital encounter of 10/14/20  US RENAL  Narrative CLINICAL DATA:  Left ureteral stone.  EXAM: RENAL / URINARY TRACT ULTRASOUND COMPLETE  COMPARISON:  CT abdomen and pelvis 07/16/2020. Renal ultrasound 05/18/2020.  FINDINGS: Right Kidney:  Renal measurements: 11.6 x 6.0 x 5.9 cm = volume: 214 mL. Echogenicity within normal limits. No mass or hydronephrosis visualized.  Left Kidney:  Renal measurements: 11.3 x 5.2 x 6.0 cm = volume: 185 mL. Echogenicity within normal limits. No mass or hydronephrosis visualized.  Bladder:  Mild circumferential bladder wall thickening which may be at least in part due to underdistension.  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  IMPRESSION: 1. Unremarkable sonographic appearance of the kidneys. 2. Mild circumferential bladder wall thickening which may be at least in part due to underdistension although cystitis is also possible. 3. Splenomegaly.   Electronically Signed By: [name] Ache M.D. On: 10/14/2020  13:51  No valid procedures specified. No results found for this or any previous visit.  No results found for this or any previous visit.   Assessment & Plan:    1. BPH with obstruction/lower urinary tract symptoms Cont tams and finasteride. Increase tams to BID.  - Urinalysis, Routine w reflex microscopic  2. Urinary hesitancy As above  - Urinalysis, Routine w reflex microscopic  3. ED - disc nature r/b/a to pde5i and sildenafil dosing. He will work his way up from 20 mg.   No follow-ups on file.  Jerilee Field, MD  [facility]  [address] [place], [place] [address] [phone]

## 2022-10-31 LAB — URINALYSIS, ROUTINE W REFLEX MICROSCOPIC
Bilirubin, UA: NEGATIVE
Glucose, UA: NEGATIVE
Ketones, UA: NEGATIVE
Nitrite, UA: NEGATIVE
RBC, UA: NEGATIVE
Specific Gravity, UA: 1.025 (ref 1.005–1.030)
Urobilinogen, Ur: 1 mg/dL (ref 0.2–1.0)
pH, UA: 6.5 (ref 5.0–7.5)

## 2022-10-31 LAB — MICROSCOPIC EXAMINATION: Bacteria, UA: NONE SEEN

## 2022-10-31 LAB — PSA: Prostate Specific Ag, Serum: 1.9 ng/mL (ref 0.0–4.0)

## 2022-11-02 ENCOUNTER — Ambulatory Visit: Payer: Medicare HMO

## 2022-11-08 ENCOUNTER — Other Ambulatory Visit: Payer: Self-pay | Admitting: Family Medicine

## 2022-11-26 ENCOUNTER — Other Ambulatory Visit: Payer: Self-pay | Admitting: Family Medicine

## 2022-12-05 DIAGNOSIS — H5203 Hypermetropia, bilateral: Secondary | ICD-10-CM | POA: Diagnosis not present

## 2023-01-31 ENCOUNTER — Other Ambulatory Visit: Payer: Self-pay | Admitting: Family Medicine

## 2023-02-06 DIAGNOSIS — F17200 Nicotine dependence, unspecified, uncomplicated: Secondary | ICD-10-CM | POA: Diagnosis not present

## 2023-02-06 DIAGNOSIS — R1310 Dysphagia, unspecified: Secondary | ICD-10-CM | POA: Diagnosis not present

## 2023-02-06 DIAGNOSIS — Z79899 Other long term (current) drug therapy: Secondary | ICD-10-CM | POA: Diagnosis not present

## 2023-02-06 DIAGNOSIS — R161 Splenomegaly, not elsewhere classified: Secondary | ICD-10-CM | POA: Diagnosis not present

## 2023-02-06 DIAGNOSIS — K7290 Hepatic failure, unspecified without coma: Secondary | ICD-10-CM | POA: Diagnosis not present

## 2023-02-06 DIAGNOSIS — Z1211 Encounter for screening for malignant neoplasm of colon: Secondary | ICD-10-CM | POA: Diagnosis not present

## 2023-02-06 DIAGNOSIS — R160 Hepatomegaly, not elsewhere classified: Secondary | ICD-10-CM | POA: Diagnosis not present

## 2023-02-06 DIAGNOSIS — K7460 Unspecified cirrhosis of liver: Secondary | ICD-10-CM | POA: Diagnosis not present

## 2023-02-06 DIAGNOSIS — K7682 Hepatic encephalopathy: Secondary | ICD-10-CM | POA: Diagnosis not present

## 2023-02-06 DIAGNOSIS — K7030 Alcoholic cirrhosis of liver without ascites: Secondary | ICD-10-CM | POA: Diagnosis not present

## 2023-02-06 DIAGNOSIS — R269 Unspecified abnormalities of gait and mobility: Secondary | ICD-10-CM | POA: Diagnosis not present

## 2023-02-08 ENCOUNTER — Other Ambulatory Visit: Payer: Self-pay | Admitting: Family Medicine

## 2023-03-03 ENCOUNTER — Other Ambulatory Visit: Payer: Self-pay | Admitting: Family Medicine

## 2023-03-07 DIAGNOSIS — H524 Presbyopia: Secondary | ICD-10-CM | POA: Diagnosis not present

## 2023-03-16 DIAGNOSIS — R270 Ataxia, unspecified: Secondary | ICD-10-CM | POA: Diagnosis not present

## 2023-03-16 DIAGNOSIS — F1021 Alcohol dependence, in remission: Secondary | ICD-10-CM | POA: Diagnosis not present

## 2023-03-16 DIAGNOSIS — R296 Repeated falls: Secondary | ICD-10-CM | POA: Diagnosis not present

## 2023-03-16 DIAGNOSIS — K7030 Alcoholic cirrhosis of liver without ascites: Secondary | ICD-10-CM | POA: Diagnosis not present

## 2023-03-23 ENCOUNTER — Ambulatory Visit (INDEPENDENT_AMBULATORY_CARE_PROVIDER_SITE_OTHER): Payer: Medicare HMO

## 2023-03-23 VITALS — Ht 69.0 in | Wt 156.0 lb

## 2023-03-23 DIAGNOSIS — Z0000 Encounter for general adult medical examination without abnormal findings: Secondary | ICD-10-CM | POA: Diagnosis not present

## 2023-03-23 NOTE — Patient Instructions (Signed)
Spencer Stanley , Thank you for taking time to come for your Medicare Wellness Visit. I appreciate your ongoing commitment to your health goals. Please review the following plan we discussed and let me know if I can assist you in the future.   Referrals/Orders/Follow-Ups/Clinician Recommendations: Aim for 30 minutes of exercise or brisk walking, 6-8 glasses of water, and 5 servings of fruits and vegetables each day.   This is a list of the screening recommended for you and due dates:  Health Maintenance  Topic Date Due   DTaP/Tdap/Td vaccine (1 - Tdap) Never done   Complete foot exam   02/01/2022   Flu Shot  12/14/2022   COVID-19 Vaccine (4 - 2023-24 season) 01/14/2023   Yearly kidney health urinalysis for diabetes  03/17/2023   Hemoglobin A1C  04/17/2023   Yearly kidney function blood test for diabetes  10/16/2023   Eye exam for diabetics  12/05/2023   Medicare Annual Wellness Visit  03/22/2024   Colon Cancer Screening  09/22/2025   Hepatitis C Screening  Completed   HIV Screening  Completed   Zoster (Shingles) Vaccine  Completed   HPV Vaccine  Aged Out    Advanced directives: (Copy Requested) Please bring a copy of your health care power of attorney and living will to the office to be added to your chart at your convenience.  Next Medicare Annual Wellness Visit scheduled for next year: Yes Insert preventive care Attachment reference

## 2023-03-23 NOTE — Progress Notes (Signed)
Subjective:   Spencer Stanley is a 62 y.o. male who presents for Medicare Annual/Subsequent preventive examination.  Visit Complete: Virtual I connected with  Spencer Stanley on 03/23/23 by a audio enabled telemedicine application and verified that I am speaking with the correct person using two identifiers.  Patient Location: Home  Provider Location: Home Office  I discussed the limitations of evaluation and management by telemedicine. The patient expressed understanding and agreed to proceed.  Vital Signs: Because this visit was a virtual/telehealth visit, some criteria may be missing or patient reported. Any vitals not documented were not able to be obtained and vitals that have been documented are patient reported.  Patient Medicare AWV questionnaire was completed by the patient on 03/23/2023; I have confirmed that all information answered by patient is correct and no changes since this date.  Cardiac Risk Factors include: advanced age (>52men, >15 women);dyslipidemia;male gender;hypertension     Objective:    Today's Vitals   03/23/23 1448  Weight: 156 lb (70.8 kg)  Height: 5\' 9"  (1.753 m)   Body mass index is 23.04 kg/m.     03/23/2023    2:52 PM 10/25/2022    3:46 PM 03/21/2022    2:55 PM 03/17/2021    3:51 PM 03/21/2018    2:32 PM 03/20/2017    2:29 PM 12/08/2015   12:35 PM  Advanced Directives  Does Patient Have a Medical Advance Directive? Yes Yes No No No Yes No  Type of Estate agent of [place];Living will     Living will;Healthcare Power of Attorney   Does patient want to make changes to medical advance directive?      No - Patient declined   Copy of Healthcare Power of Attorney in Chart? No - copy requested     No - copy requested   Would patient like information on creating a medical advance directive?   No - Patient declined No - Patient declined Yes (MAU/Ambulatory/Procedural Areas - Information given)      Current Medications  (verified) Outpatient Encounter Medications as of 03/23/2023  Medication Sig   Accu-Chek Softclix Lancets lancets TEST BLOOD SUGAR THREE TIMES DAILY Dx E11.9   Alcohol Swabs (DROPSAFE ALCOHOL PREP) 70 % PADS TEST BLOOD SUGAR THREE TIMES DAILY Dx E11.9   atorvastatin (LIPITOR) 10 MG tablet TAKE 1 TABLET EVERY DAY   Blood Glucose Calibration (ACCU-CHEK AVIVA) SOLN Use with Accu-Chek meter   CALCIUM-MAGNESIUM-ZINC PO Take 1 capsule by mouth 2 (two) times daily.   ciclopirox (PENLAC) 8 % solution APPLY TOPICALLY AT BEDTIME TO NAIL/ SURROUNDING SKIN OVER PREVIOUS COAT. AFTER 7 DAYS MAY REMOVE W/ ALCOHOL AND REPEAT CYCLE   finasteride (PROSCAR) 5 MG tablet Take 1 tablet (5 mg total) by mouth daily.   FLUoxetine (PROZAC) 20 MG capsule TAKE 1 CAPSULE EVERY DAY   glucose blood (ACCU-CHEK AVIVA PLUS) test strip CHECK BLOOD SUGAR THREE TIMES DAILY Dx E11.9   lactulose (CHRONULAC) 10 GM/15ML solution Take 45 g by mouth 3 (three) times daily.   Multiple Vitamins-Minerals (ZINC PO) Take by mouth.   multivitamin (ONE-A-DAY MEN'S) TABS tablet Take 1 tablet by mouth daily.   pantoprazole (PROTONIX) 40 MG tablet TAKE 1 TABLET EVERY DAY   rifaximin (XIFAXAN) 550 MG TABS tablet Take 1 tablet (550 mg total) by mouth 2 (two) times daily.   sildenafil (REVATIO) 20 MG tablet Take 2-5 pills at once, orally, with each sexual encounter   spironolactone (ALDACTONE) 25 MG tablet TAKE 1 TABLET EVERY DAY  tamsulosin (FLOMAX) 0.4 MG CAPS capsule Take 1 capsule (0.4 mg total) by mouth 2 (two) times daily.   zinc sulfate 220 (50 Zn) MG capsule Take 1 capsule by mouth daily.   No facility-administered encounter medications on file as of 03/23/2023.    Allergies (verified) Other   History: Past Medical History:  Diagnosis Date   Anxiety    Cirrhosis (HCC)    Cirrhosis of liver (HCC)    Diabetes mellitus without complication (HCC)    Esophageal varices (HCC)    High cholesterol    Lyme disease    [place]  spotted fever    Past Surgical History:  Procedure Laterality Date   ESOPHAGEAL VARICE LIGATION     TIPS   HERNIA REPAIR     Family History  Problem Relation Age of Onset   Aneurysm Mother    Cancer Mother        breast   Dementia Father    Cancer Father        brain tumor   Heart failure Father    Diabetes Sister    Social History   Socioeconomic History   Marital status: Media planner    Spouse name: Not on file   Number of children: 0   Years of education: 16   Highest education level: Bachelor's degree (e.g., BA, AB, BS)  Occupational History   Occupation: disability  Tobacco Use   Smoking status: Every Day    Current packs/day: 0.50    Average packs/day: 0.5 packs/day for 33.0 years (16.5 ttl pk-yrs)    Types: Cigarettes   Smokeless tobacco: Former    Types: Chew    Quit date: 1981   Tobacco comments:    03/21/2018 cut back to 1/2 ppd  Vaping Use   Vaping status: Never Used  Substance and Sexual Activity   Alcohol use: No    Comment: no alcohol since 2013   Drug use: No   Sexual activity: Yes  Other Topics Concern   Not on file  Social History Narrative   Not on file   Social Determinants of Health   Financial Resource Strain: Low Risk  (03/23/2023)   Overall Financial Resource Strain (CARDIA)    Difficulty of Paying Living Expenses: Not hard at all  Food Insecurity: No Food Insecurity (03/23/2023)   Hunger Vital Sign    Worried About Running Out of Food in the Last Year: Never true    Ran Out of Food in the Last Year: Never true  Transportation Needs: No Transportation Needs (03/23/2023)   PRAPARE - Administrator, Civil Service (Medical): No    Lack of Transportation (Non-Medical): No  Physical Activity: Insufficiently Active (03/23/2023)   Exercise Vital Sign    Days of Exercise per Week: 3 days    Minutes of Exercise per Session: 30 min  Stress: No Stress Concern Present (03/23/2023)   [employer] of Occupational Health -  Occupational Stress Questionnaire    Feeling of Stress : Not at all  Social Connections: Moderately Isolated (03/23/2023)   Social Connection and Isolation Panel [NHANES]    Frequency of Communication with Friends and Family: More than three times a week    Frequency of Social Gatherings with Friends and Family: More than three times a week    Attends Religious Services: Never    Database administrator or Organizations: No    Attends Banker Meetings: Never    Marital Status: Living with partner  Tobacco Counseling Ready to quit: Not Answered Counseling given: Not Answered Tobacco comments: 03/21/2018 cut back to 1/2 ppd   Clinical Intake:  Pre-visit preparation completed: Yes  Pain : No/denies pain     Nutritional Risks: None Diabetes: No  How often do you need to have someone help you when you read instructions, pamphlets, or other written materials from your doctor or pharmacy?: 1 - Never  Interpreter Needed?: No  Information entered by :: Renie Ora, LPN   Activities of Daily Living    03/23/2023    2:53 PM  In your present state of health, do you have any difficulty performing the following activities:  Hearing? 0  Vision? 0  Difficulty concentrating or making decisions? 0  Walking or climbing stairs? 0  Dressing or bathing? 0  Doing errands, shopping? 0  Preparing Food and eating ? N  Using the Toilet? N  In the past six months, have you accidently leaked urine? N  Do you have problems with loss of bowel control? N  Managing your Medications? N  Managing your Finances? N  Housekeeping or managing your Housekeeping? N    Patient Care Team: Mechele Claude, MD as PCP - General (Family Medicine) Andrena Mews, MD as Referring Physician (Gastroenterology) Serena Colonel, MD as Consulting Physician (Otolaryngology) Delora Fuel, OD (Optometry) Jerilee Field, MD as Consulting Physician (Urology)  Indicate any recent Medical Services  you may have received from other than [facility] providers in the past year (date may be approximate).     Assessment:   This is a routine wellness examination for Spencer Stanley.  Hearing/Vision screen Vision Screening - Comments:: Wears rx glasses - up to date with routine eye exams with  Dr.Johnson    Goals Addressed             This Visit's Progress    DIET - EAT MORE FRUITS AND VEGETABLES         Depression Screen    03/23/2023    2:51 PM 10/16/2022    1:33 PM 03/21/2022    2:54 PM 03/16/2022    1:36 PM 03/16/2022    1:26 PM 11/08/2021    8:21 AM 08/03/2021    3:08 PM  PHQ 2/9 Scores  PHQ - 2 Score 0 2 0 0 0 2 2  PHQ- 9 Score  3  2  4 4     Fall Risk    03/23/2023    2:49 PM 10/16/2022    1:36 PM 10/16/2022    1:33 PM 03/21/2022    3:01 PM 03/16/2022    1:36 PM  Fall Risk   Falls in the past year? 0 1 1 1 1   Number falls in past yr: 0 0 0 0 0  Injury with Fall? 0 0 0 0 0  Risk for fall due to : No Fall Risks History of fall(s) History of fall(s) History of fall(s) History of fall(s)  Follow up Falls prevention discussed Falls evaluation completed Falls evaluation completed Falls evaluation completed Falls evaluation completed    MEDICARE RISK AT HOME: Medicare Risk at Home Any stairs in or around the home?: No If so, are there any without handrails?: No Home free of loose throw rugs in walkways, pet beds, electrical cords, etc?: Yes Adequate lighting in your home to reduce risk of falls?: Yes Life alert?: No Use of a cane, walker or w/c?: No Grab bars in the bathroom?: Yes Shower chair or bench in shower?: Yes Elevated toilet seat or  a handicapped toilet?: Yes  TIMED UP AND GO:  Was the test performed?  No    Cognitive Function:    03/20/2017    2:49 PM  MMSE - Mini Mental State Exam  Orientation to time 5  Orientation to Place 5  Registration 3  Attention/ Calculation 5  Recall 3  Language- name 2 objects 2  Language- repeat 1  Language- follow 3 step command 3   Language- read & follow direction 1  Write a sentence 1  Copy design 1  Total score 30        03/23/2023    2:53 PM 03/21/2022    2:58 PM 03/17/2021    3:50 PM 03/21/2018    2:52 PM  6CIT Screen  What Year? 0 points 0 points 0 points 0 points  What month? 0 points 0 points 0 points 0 points  What time? 0 points 0 points 0 points 0 points  Count back from 20 0 points 0 points 0 points 0 points  Months in reverse 0 points 0 points 0 points 0 points  Repeat phrase 0 points 0 points 0 points 0 points  Total Score 0 points 0 points 0 points 0 points    Immunizations Immunization History  Administered Date(s) Administered   Hepatitis B, ADULT 04/26/2018   Influenza,inj,Quad PF,6+ Mos 06/19/2014, 03/26/2015, 02/25/2016, 03/20/2017, 03/21/2018, 03/04/2019, 04/29/2020, 02/01/2021, 03/16/2022   PFIZER(Purple Top)SARS-COV-2 Vaccination 08/01/2019, 08/26/2019, 04/10/2020   Pneumococcal Conjugate-13 11/03/2019   Pneumococcal Polysaccharide-23 03/20/2017   Zoster Recombinant(Shingrix) 08/03/2021, 11/08/2021    TDAP status: Due, Education has been provided regarding the importance of this vaccine. Advised may receive this vaccine at local pharmacy or Health Dept. Aware to provide a copy of the vaccination record if obtained from local pharmacy or Health Dept. Verbalized acceptance and understanding.  Flu Vaccine status: Due, Education has been provided regarding the importance of this vaccine. Advised may receive this vaccine at local pharmacy or Health Dept. Aware to provide a copy of the vaccination record if obtained from local pharmacy or Health Dept. Verbalized acceptance and understanding.  Pneumococcal vaccine status: Up to date  Covid-19 vaccine status: Completed vaccines  Qualifies for Shingles Vaccine? Yes   Zostavax completed Yes   Shingrix Completed?: Yes  Screening Tests Health Maintenance  Topic Date Due   DTaP/Tdap/Td (1 - Tdap) Never done   FOOT EXAM  02/01/2022    INFLUENZA VACCINE  12/14/2022   COVID-19 Vaccine (4 - 2023-24 season) 01/14/2023   Diabetic kidney evaluation - Urine ACR  03/17/2023   HEMOGLOBIN A1C  04/17/2023   Diabetic kidney evaluation - eGFR measurement  10/16/2023   OPHTHALMOLOGY EXAM  12/05/2023   Medicare Annual Wellness (AWV)  03/22/2024   Colonoscopy  09/22/2025   Hepatitis C Screening  Completed   HIV Screening  Completed   Zoster Vaccines- Shingrix  Completed   HPV VACCINES  Aged Out    Health Maintenance  Health Maintenance Due  Topic Date Due   DTaP/Tdap/Td (1 - Tdap) Never done   FOOT EXAM  02/01/2022   INFLUENZA VACCINE  12/14/2022   COVID-19 Vaccine (4 - 2023-24 season) 01/14/2023   Diabetic kidney evaluation - Urine ACR  03/17/2023    Colorectal cancer screening: Type of screening: Colonoscopy. Completed 09/22/2020. Repeat every 5 years  Lung Cancer Screening: (Low Dose CT Chest recommended if Age 86-80 years, 20 pack-year currently smoking OR have quit w/in 15years.) does not qualify.   Lung Cancer Screening Referral: n/a  Additional  Screening:  Hepatitis C Screening: does not qualify; Completed 11/03/2019  Vision Screening: Recommended annual ophthalmology exams for early detection of glaucoma and other disorders of the eye. Is the patient up to date with their annual eye exam?  Yes  Who is the provider or what is the name of the office in which the patient attends annual eye exams? Dr.johnson  If pt is not established with a provider, would they like to be referred to a provider to establish care? No .   Dental Screening: Recommended annual dental exams for proper oral hygiene  Diabetic Foot Exam: Diabetic Foot Exam: Overdue, Pt has been advised about the importance in completing this exam. Pt is scheduled for diabetic foot exam on next office visit .  Community Resource Referral / Chronic Care Management: CRR required this visit?  No   CCM required this visit?  No     Plan:     I have  personally reviewed and noted the following in the patient's chart:   Medical and social history Use of alcohol, tobacco or illicit drugs  Current medications and supplements including opioid prescriptions. Patient is not currently taking opioid prescriptions. Functional ability and status Nutritional status Physical activity Advanced directives List of other physicians Hospitalizations, surgeries, and ER visits in previous 12 months Vitals Screenings to include cognitive, depression, and falls Referrals and appointments  In addition, I have reviewed and discussed with patient certain preventive protocols, quality metrics, and best practice recommendations. A written personalized care plan for preventive services as well as general preventive health recommendations were provided to patient.     Lorrene Reid, LPN   [id number]   After Visit Summary: (MyChart) Due to this being a telephonic visit, the after visit summary with patients personalized plan was offered to patient via MyChart   Nurse Notes: Due Tdap Vaccine

## 2023-03-26 DIAGNOSIS — R270 Ataxia, unspecified: Secondary | ICD-10-CM | POA: Diagnosis not present

## 2023-04-14 ENCOUNTER — Other Ambulatory Visit: Payer: Self-pay | Admitting: Family Medicine

## 2023-04-17 ENCOUNTER — Other Ambulatory Visit: Payer: Self-pay | Admitting: *Deleted

## 2023-04-17 ENCOUNTER — Ambulatory Visit (INDEPENDENT_AMBULATORY_CARE_PROVIDER_SITE_OTHER): Payer: Medicare HMO | Admitting: Family Medicine

## 2023-04-17 ENCOUNTER — Encounter: Payer: Self-pay | Admitting: Family Medicine

## 2023-04-17 VITALS — BP 84/50 | HR 80 | Temp 98.1°F | Ht 69.0 in | Wt 164.0 lb

## 2023-04-17 DIAGNOSIS — E119 Type 2 diabetes mellitus without complications: Secondary | ICD-10-CM

## 2023-04-17 DIAGNOSIS — E782 Mixed hyperlipidemia: Secondary | ICD-10-CM

## 2023-04-17 DIAGNOSIS — Z125 Encounter for screening for malignant neoplasm of prostate: Secondary | ICD-10-CM | POA: Diagnosis not present

## 2023-04-17 DIAGNOSIS — K7030 Alcoholic cirrhosis of liver without ascites: Secondary | ICD-10-CM

## 2023-04-17 DIAGNOSIS — G119 Hereditary ataxia, unspecified: Secondary | ICD-10-CM | POA: Diagnosis not present

## 2023-04-17 DIAGNOSIS — Z23 Encounter for immunization: Secondary | ICD-10-CM

## 2023-04-17 DIAGNOSIS — G319 Degenerative disease of nervous system, unspecified: Secondary | ICD-10-CM | POA: Diagnosis not present

## 2023-04-17 LAB — BAYER DCA HB A1C WAIVED: HB A1C (BAYER DCA - WAIVED): 5.5 % (ref 4.8–5.6)

## 2023-04-17 NOTE — Progress Notes (Signed)
Subjective:  Patient ID: Spencer Stanley,  male    DOB: 08/14/1960  Age: 62 y.o.    CC: Medical Management of Chronic Issues   HPI Spencer Stanley presents for  follow-up of hypertension. Patient has no history of headache chest pain or shortness of breath or recent cough. Patient also denies symptoms of TIA such as numbness weakness lateralizing. Patient denies side effects from medication. States taking it regularly.  Patient also  in for follow-up of elevated cholesterol. Doing well without complaints on current medication. Denies side effects  including myalgia and arthralgia and nausea. Also in today for liver function testing. Currently no chest pain, shortness of breath or other cardiovascular related symptoms noted.  Follow-up of diabetes. Patient does not check blood sugar at home.  The is due to multiple A1c's recently being in nondiabetic levels in spite of him no longer taking antidiabetic medication.  Patient denies symptoms such as excessive hunger or urinary frequency, excessive hunger, nausea No significant hypoglycemic spells noted.  Medications reviewed. Pt reports taking them regularly. Pt. denies complication/adverse reaction today.   Recent MRI of brian showed age advanced cerebellar and cerebral atrophy. Pt. Reports staggering due to poor balance chronically. Saw a neuro MD at [facility] who ordered the scan. Reviewed today with pt. On MyChart.  History Spencer Stanley has a past medical history of Anxiety, Cirrhosis (HCC), Cirrhosis of liver (HCC), Diabetes mellitus without complication (HCC), Esophageal varices (HCC), High cholesterol, Lyme disease, and [facility] spotted fever.   Spencer Stanley has a past surgical history that includes Hernia repair and Esophageal varice ligation.   His family history includes Aneurysm in his mother; Cancer in his father and mother; Dementia in his father; Diabetes in his sister; Heart failure in his father.Spencer Stanley reports that Spencer Stanley has been smoking cigarettes. Spencer Stanley has  a 16.5 pack-year smoking history. Spencer Stanley quit smokeless tobacco use about 43 years ago.  His smokeless tobacco use included chew. Spencer Stanley reports that Spencer Stanley does not drink alcohol and does not use drugs.  Current Outpatient Medications on File Prior to Visit  Medication Sig Dispense Refill   Alcohol Swabs (DROPSAFE ALCOHOL PREP) 70 % PADS TEST BLOOD SUGAR THREE TIMES DAILY Dx E11.9 300 each 3   atorvastatin (LIPITOR) 10 MG tablet TAKE 1 TABLET EVERY DAY 90 tablet 0   CALCIUM-MAGNESIUM-ZINC PO Take 1 capsule by mouth 2 (two) times daily.     finasteride (PROSCAR) 5 MG tablet Take 1 tablet (5 mg total) by mouth daily. 90 tablet 3   FLUoxetine (PROZAC) 20 MG capsule TAKE 1 CAPSULE EVERY DAY 90 capsule 0   lactulose (CHRONULAC) 10 GM/15ML solution Take 45 g by mouth 3 (three) times daily.     Multiple Vitamins-Minerals (ZINC PO) Take by mouth.     multivitamin (ONE-A-DAY MEN'S) TABS tablet Take 1 tablet by mouth daily.     pantoprazole (PROTONIX) 40 MG tablet TAKE 1 TABLET EVERY DAY 90 tablet 1   rifaximin (XIFAXAN) 550 MG TABS tablet Take 1 tablet (550 mg total) by mouth 2 (two) times daily. 60 tablet 5   sildenafil (REVATIO) 20 MG tablet Take 2-5 pills at once, orally, with each sexual encounter 20 tablet 0   tamsulosin (FLOMAX) 0.4 MG CAPS capsule Take 1 capsule (0.4 mg total) by mouth 2 (two) times daily. 180 capsule 3   zinc sulfate 220 (50 Zn) MG capsule Take 1 capsule by mouth daily.     No current facility-administered medications on file prior to visit.  ROS Review of Systems  Constitutional:  Negative for fever.  Respiratory:  Negative for shortness of breath.   Cardiovascular:  Negative for chest pain.  Genitourinary:  Positive for frequency (with nocturia X 3-5).  Musculoskeletal:  Negative for arthralgias.  Skin:  Negative for rash.  Neurological:  Positive for dizziness (Primarily imbalance).  Primarily  Objective:  BP (!) 84/50   Pulse 80   Temp 98.1 F (36.7 C)   Ht 5\' 9"   (1.753 m)   Wt 164 lb (74.4 kg)   SpO2 100%   BMI 24.22 kg/m   BP Readings from Last 3 Encounters:  04/17/23 (!) 84/50  10/30/22 112/68  10/16/22 134/73    Wt Readings from Last 3 Encounters:  04/17/23 164 lb (74.4 kg)  03/23/23 156 lb (70.8 kg)  10/16/22 156 lb 3.2 oz (70.9 kg)     Physical Exam Vitals reviewed.  Constitutional:      Appearance: Spencer Stanley is well-developed.  HENT:     Head: Normocephalic and atraumatic.     Right Ear: External ear normal.     Left Ear: External ear normal.     Mouth/Throat:     Pharynx: No oropharyngeal exudate or posterior oropharyngeal erythema.  Eyes:     Pupils: Pupils are equal, round, and reactive to light.  Cardiovascular:     Rate and Rhythm: Normal rate and regular rhythm.     Heart sounds: No murmur heard. Pulmonary:     Effort: No respiratory distress.     Breath sounds: Normal breath sounds.  Musculoskeletal:        General: Normal range of motion.     Cervical back: Normal range of motion and neck supple.  Neurological:     Mental Status: Spencer Stanley is alert and oriented to person, place, and time.     Diabetic Foot Exam - Simple   No data filed     Lab Results  Component Value Date   HGBA1C 5.5 04/17/2023   HGBA1C 5.7 (H) 10/16/2022   HGBA1C 5.4 03/16/2022    Assessment & Plan:   Spencer Stanley was seen today for medical management of chronic issues.  Diagnoses and all orders for this visit:  Cerebellar atrophy (HCC)  Type 2 diabetes mellitus without complication, without long-term current use of insulin (HCC) -     Bayer DCA Hb A1c Waived -     PSA Total (Reflex To Free) -     CBC with Differential/Platelet -     CMP14+EGFR -     Microalbumin / creatinine urine ratio  Alcoholic cirrhosis of liver without ascites (HCC) -     PSA Total (Reflex To Free) -     Ammonia  Mixed hyperlipidemia -     PSA Total (Reflex To Free) -     CMP14+EGFR -     Lipid panel  Cerebellar ataxia (HCC)  Need for influenza  vaccination -     Flu vaccine trivalent PF, 6mos and older(Flulaval,Afluria,Fluarix,Fluzone)   I have discontinued Spencer Stanley's Accu-Chek Aviva, spironolactone, Accu-Chek Aviva Plus, ciclopirox, and Accu-Chek Softclix Lancets. I am also having him maintain his multivitamin, CALCIUM-MAGNESIUM-ZINC PO, rifaximin, Multiple Vitamins-Minerals (ZINC PO), lactulose, zinc sulfate (50mg  elemental zinc), sildenafil, tamsulosin, finasteride, pantoprazole, FLUoxetine, DropSafe Alcohol Prep, and atorvastatin.  No orders of the defined types were placed in this encounter.    Follow-up: Return in about 6 months (around 10/16/2023).  Mechele Claude, M.D.

## 2023-04-19 LAB — CBC WITH DIFFERENTIAL/PLATELET
Basos: 1 %
EOS (ABSOLUTE): 0.1 10*3/uL (ref 0.0–0.2)
Eos: 6 %
Hematocrit: 37.5 % (ref 37.5–51.0)
Hemoglobin: 11.5 g/dL — ABNORMAL LOW (ref 13.0–17.7)
Immature Granulocytes: 0 %
Immature Granulocytes: 0 10*3/uL (ref 0.0–0.1)
Lymphs: 26 %
MCH: 27.4 pg (ref 26.6–33.0)
MCHC: 30.7 g/dL — ABNORMAL LOW (ref 31.5–35.7)
MCV: 90 fL (ref 79–97)
Monocytes Absolute: 0.2 10*3/uL (ref 0.0–0.4)
Monocytes Absolute: 0.5 10*3/uL (ref 0.1–0.9)
Monocytes: 12 %
Neutrophils Absolute: 1.1 10*3/uL (ref 0.7–3.1)
Neutrophils Absolute: 2.2 10*3/uL (ref 1.4–7.0)
Neutrophils: 55 %
Platelets: 84 10*3/uL — CL (ref 150–450)
RBC: 4.19 x10E6/uL (ref 4.14–5.80)
RDW: 14.5 % (ref 11.6–15.4)
WBC: 4.1 10*3/uL (ref 3.4–10.8)

## 2023-04-19 LAB — CMP14+EGFR
ALT: 18 [IU]/L (ref 0–44)
AST: 25 [IU]/L (ref 0–40)
Albumin: 3.8 g/dL — ABNORMAL LOW (ref 3.9–4.9)
Alkaline Phosphatase: 106 [IU]/L (ref 44–121)
BUN/Creatinine Ratio: 11 (ref 10–24)
BUN: 9 mg/dL (ref 8–27)
Bilirubin Total: 0.4 mg/dL (ref 0.0–1.2)
CO2: 23 mmol/L (ref 20–29)
Calcium: 9 mg/dL (ref 8.6–10.2)
Chloride: 107 mmol/L — ABNORMAL HIGH (ref 96–106)
Creatinine, Ser: 0.84 mg/dL (ref 0.76–1.27)
Globulin, Total: 2.3 g/dL (ref 1.5–4.5)
Glucose: 130 mg/dL — ABNORMAL HIGH (ref 70–99)
Potassium: 4.2 mmol/L (ref 3.5–5.2)
Sodium: 142 mmol/L (ref 134–144)
Total Protein: 6.1 g/dL (ref 6.0–8.5)
eGFR: 98 mL/min/{1.73_m2} (ref >59)

## 2023-04-19 LAB — PSA TOTAL (REFLEX TO FREE): Prostate Specific Ag, Serum: 0.7 ng/mL (ref 0.0–4.0)

## 2023-04-19 LAB — LIPID PANEL
Chol/HDL Ratio: 2.7 {ratio} (ref 0.0–5.0)
Cholesterol, Total: 116 mg/dL (ref 100–199)
HDL: 43 mg/dL (ref >39)
LDL Chol Calc (NIH): 52 mg/dL (ref 0–99)
Triglycerides: 115 mg/dL (ref 0–149)
VLDL Cholesterol Cal: 21 mg/dL (ref 5–40)

## 2023-04-19 LAB — AMMONIA: Ammonia: 63 ug/dL (ref 40–200)

## 2023-04-22 ENCOUNTER — Other Ambulatory Visit: Payer: Self-pay | Admitting: Family Medicine

## 2023-04-22 NOTE — Progress Notes (Signed)
Hello Jabron,  Your lab result is normal and/or stable.Some minor variations that are not significant are commonly marked abnormal, but do not represent any medical problem for you.  Best regards, Emunah Texidor, M.D.

## 2023-04-27 DIAGNOSIS — Q402 Other specified congenital malformations of stomach: Secondary | ICD-10-CM | POA: Diagnosis not present

## 2023-04-27 DIAGNOSIS — K7460 Unspecified cirrhosis of liver: Secondary | ICD-10-CM | POA: Diagnosis not present

## 2023-04-27 DIAGNOSIS — K219 Gastro-esophageal reflux disease without esophagitis: Secondary | ICD-10-CM | POA: Diagnosis not present

## 2023-04-27 DIAGNOSIS — R1310 Dysphagia, unspecified: Secondary | ICD-10-CM | POA: Diagnosis not present

## 2023-04-27 DIAGNOSIS — E119 Type 2 diabetes mellitus without complications: Secondary | ICD-10-CM | POA: Diagnosis not present

## 2023-04-27 DIAGNOSIS — K5730 Diverticulosis of large intestine without perforation or abscess without bleeding: Secondary | ICD-10-CM | POA: Diagnosis not present

## 2023-04-27 DIAGNOSIS — Z860100 Personal history of colon polyps, unspecified: Secondary | ICD-10-CM | POA: Diagnosis not present

## 2023-04-27 DIAGNOSIS — K317 Polyp of stomach and duodenum: Secondary | ICD-10-CM | POA: Diagnosis not present

## 2023-04-27 DIAGNOSIS — K3189 Other diseases of stomach and duodenum: Secondary | ICD-10-CM | POA: Diagnosis not present

## 2023-04-27 DIAGNOSIS — D132 Benign neoplasm of duodenum: Secondary | ICD-10-CM | POA: Diagnosis not present

## 2023-04-27 DIAGNOSIS — Z1211 Encounter for screening for malignant neoplasm of colon: Secondary | ICD-10-CM | POA: Diagnosis not present

## 2023-06-06 ENCOUNTER — Other Ambulatory Visit: Payer: Self-pay

## 2023-06-06 DIAGNOSIS — Z122 Encounter for screening for malignant neoplasm of respiratory organs: Secondary | ICD-10-CM

## 2023-06-06 DIAGNOSIS — Z87891 Personal history of nicotine dependence: Secondary | ICD-10-CM

## 2023-06-15 DIAGNOSIS — Z23 Encounter for immunization: Secondary | ICD-10-CM | POA: Diagnosis not present

## 2023-06-15 DIAGNOSIS — K7290 Hepatic failure, unspecified without coma: Secondary | ICD-10-CM | POA: Diagnosis not present

## 2023-06-15 DIAGNOSIS — K7460 Unspecified cirrhosis of liver: Secondary | ICD-10-CM | POA: Diagnosis not present

## 2023-06-20 ENCOUNTER — Other Ambulatory Visit: Payer: Self-pay | Admitting: Family Medicine

## 2023-06-21 DIAGNOSIS — F1021 Alcohol dependence, in remission: Secondary | ICD-10-CM | POA: Diagnosis not present

## 2023-06-21 DIAGNOSIS — R296 Repeated falls: Secondary | ICD-10-CM | POA: Diagnosis not present

## 2023-06-21 DIAGNOSIS — R270 Ataxia, unspecified: Secondary | ICD-10-CM | POA: Diagnosis not present

## 2023-06-21 DIAGNOSIS — K7682 Hepatic encephalopathy: Secondary | ICD-10-CM | POA: Diagnosis not present

## 2023-06-26 ENCOUNTER — Telehealth: Payer: Self-pay

## 2023-06-26 NOTE — Telephone Encounter (Signed)
Copied from CRM [id number]. Topic: Clinical - Medication Question >> Jun 26, 2023 11:35 AM Fonda Kinder J wrote: Reason for CRM: Pt states he was prescribed an antibiotic that was discontinued and he would like someone to give him a call back to discuss. He states he doesn't know why he was on it or why it was discontinued

## 2023-06-26 NOTE — Telephone Encounter (Signed)
I can do that at the time of his next office visit.

## 2023-06-26 NOTE — Telephone Encounter (Signed)
Pt informed    LS

## 2023-06-28 ENCOUNTER — Other Ambulatory Visit: Payer: Self-pay | Admitting: Family Medicine

## 2023-07-09 ENCOUNTER — Other Ambulatory Visit: Payer: Self-pay

## 2023-07-09 ENCOUNTER — Ambulatory Visit: Payer: Medicare HMO | Attending: Neurology

## 2023-07-09 DIAGNOSIS — M6281 Muscle weakness (generalized): Secondary | ICD-10-CM | POA: Diagnosis not present

## 2023-07-09 DIAGNOSIS — R2689 Other abnormalities of gait and mobility: Secondary | ICD-10-CM | POA: Diagnosis not present

## 2023-07-09 NOTE — Therapy (Unsigned)
 OUTPATIENT PHYSICAL THERAPY NEURO EVALUATION   Patient Name: Spencer Stanley MRN: [id number] DOB:1960-07-14, 63 y.o., male Today's Date: 07/09/2023   PCP: Mechele Claude, MD REFERRING PROVIDER: Ward, Lamonte Richer, MD   END OF SESSION:  PT End of Session - 07/09/23 1423     Visit Number 1    Number of Visits 8    Date for PT Re-Evaluation 09/07/23    PT Start Time 1353    PT Stop Time 1406   Patient had to leave early due to a scheduling conflict.   PT Time Calculation (min) 13 min    Activity Tolerance Patient tolerated treatment well    Behavior During Therapy WFL for tasks assessed/performed             Past Medical History:  Diagnosis Date   Anxiety    Cirrhosis (HCC)    Cirrhosis of liver (HCC)    Diabetes mellitus without complication (HCC)    Esophageal varices (HCC)    High cholesterol    Lyme disease    [place] spotted fever    Past Surgical History:  Procedure Laterality Date   ESOPHAGEAL VARICE LIGATION     TIPS   HERNIA REPAIR     Patient Active Problem List   Diagnosis Date Noted   Hematochezia 06/27/2020   Tobacco dependence 12/24/2018   Alcoholic cirrhosis of liver without ascites (HCC) 03/21/2018   Anemia 03/21/2018   Esophageal varices in alcoholic cirrhosis (HCC) 03/21/2018   Soreness of tongue 01/23/2017   Elevated lipase 11/11/2015   Hyperammonemia (HCC) 11/11/2015   Thrombocytopenia (HCC) 11/11/2015   Depression 07/16/2015   GERD (gastroesophageal reflux disease) 07/16/2015   Type 2 diabetes mellitus (HCC) 07/16/2015   Encephalopathy, portal systemic (HCC) 06/06/2015   Decompensated hepatic cirrhosis (HCC) 06/06/2015   Type 2 diabetes mellitus without complication (HCC) 12/22/2014   Hyperlipemia 12/22/2014   Cirrhosis (HCC) 09/26/2013    ONSET DATE: "years ago"  REFERRING DIAG: Ataxia, unspecified, Alcohol dependence, in remission, Hepatic encephalopathy   THERAPY DIAG:  Other abnormalities of gait and mobility  Rationale  for Evaluation and Treatment: Rehabilitation  SUBJECTIVE:                                                                                                                                                                                             SUBJECTIVE STATEMENT: Patient reports that he has been having balance problems for years, but it has been getting worse. This was all due to alcohol abuse while he was younger. He has fell about 3 weeks ago when he was feeding his dog and then fell backward.  He had been going to the gym for a few weeks last year which seemed to help, but then he went out of town for a week and never went back.  Pt accompanied by: self  PERTINENT HISTORY: cirrhosis, diabetes type 2, encephalopathy, depression, current smoker, and anxiety  PAIN:  Are you having pain? No  PRECAUTIONS: Fall  RED FLAGS: None   WEIGHT BEARING RESTRICTIONS: No  FALLS: Has patient fallen in last 6 months? Yes. Number of falls 1  LIVING ENVIRONMENT: Lives with: lives with an adult companion Lives in: House/apartment Stairs: No Has following equipment at home: None  OCCUPATION:  [employer]; 8-12 hour shift, primarily walking  PLOF: Independent  PATIENT GOALS: improved balance and safety  NEXT MD FOLLOW UP:  June 2025  OBJECTIVE:  Note: Objective measures were completed at Evaluation unless otherwise noted.  COGNITION: Overall cognitive status: Within functional limits for tasks assessed   SENSATION: Patient reports no numbness or tingling  COORDINATION: Unable to be assessed due to time constraints   LOWER EXTREMITY ROM: WFL for activities assessed  LOWER EXTREMITY MMT:    MMT Right Eval Left Eval  Hip flexion 4/5 4/5  Hip extension    Hip abduction    Hip adduction    Hip internal rotation    Hip external rotation    Knee flexion 4+/5 4+/5  Knee extension 5/5 4+/5  Ankle dorsiflexion    Ankle plantarflexion    Ankle inversion    Ankle eversion    (Blank  rows = not tested)  TRANSFERS: Assistive device utilized: None  Sit to stand: Complete Independence Stand to sit: Complete Independence  GAIT: Gait pattern: step through pattern, decreased stride length, Right foot flat, Left foot flat, poor foot clearance- Right, and poor foot clearance- Left Assistive device utilized: None Level of assistance: Complete Independence Comments:   Gait speed: 1.01 m/s    FUNCTIONAL TESTS:  5 times sit to stand: 18.21 seconds without UE support  PATIENT SURVEYS:  ABC scale to be assessed as able at follow up appointments                                                                                                                               TREATMENT DATE:     PATIENT EDUCATION: Education details: plan of care and prognosis Person educated: Patient Education method: Explanation Education comprehension: verbalized understanding  HOME EXERCISE PROGRAM:   GOALS: Goals reviewed with patient? No  LONG TERM GOALS: Target date: 08/06/23  Patient will be independent with his HEP.  Baseline:  Goal status: INITIAL  2.  Patient will improve his 5 times sit to stand time to 15 seconds or less to reduce his fall risk. Baseline:  Goal status: INITIAL  3.  Patient will be able to report the importance of returning to his previous exercise routine to reduce his risk of fall Baseline:  Goal status: INITIAL  ASSESSMENT:  CLINICAL IMPRESSION:  Patient is a 63 y.o. male who was seen today for physical therapy evaluation and treatment for gait deviations and a history of falling. He is at a high fall risk as evidenced by his 5 times sit to stand time and his history of falling. Further objective assessments were unable to be performed at this time as he had to leave his appointment early due to another scheduling conflict. These assessments will be completed at follow-up appointments, as able. Recommend that he continue with skilled physical therapy to  address his impairments to maximize his safety and functional mobility.  OBJECTIVE IMPAIRMENTS: Abnormal gait, decreased activity tolerance, decreased balance, decreased mobility, difficulty walking, and decreased strength.   ACTIVITY LIMITATIONS: locomotion level  PARTICIPATION LIMITATIONS: shopping, community activity, and occupation  PERSONAL FACTORS: Past/current experiences, Time since onset of injury/illness/exacerbation, and 3+ comorbidities: cirrhosis, diabetes type 2, encephalopathy, depression, current smoker, and anxiety  are also affecting patient's functional outcome.   REHAB POTENTIAL: Fair    CLINICAL DECISION MAKING: Evolving/moderate complexity  EVALUATION COMPLEXITY: Moderate  PLAN:  PT FREQUENCY: 2x/week  PT DURATION: 4 weeks  PLANNED INTERVENTIONS: 97164- PT Re-evaluation, 97110-Therapeutic exercises, 97530- Therapeutic activity, 97112- Neuromuscular re-education, 97535- Self Care, 16109- Manual therapy, [phone]- Gait training, Patient/Family education, Balance training, and Stair training  PLAN FOR NEXT SESSION: Nustep, provide ABC scale, static and dynamic balance interventions, and lower extremity strengthening   Granville Lewis, PT 07/09/2023, 4:30 PM

## 2023-07-12 ENCOUNTER — Ambulatory Visit: Payer: Medicare HMO

## 2023-07-12 DIAGNOSIS — M6281 Muscle weakness (generalized): Secondary | ICD-10-CM

## 2023-07-12 DIAGNOSIS — R2689 Other abnormalities of gait and mobility: Secondary | ICD-10-CM | POA: Diagnosis not present

## 2023-07-12 NOTE — Therapy (Signed)
 OUTPATIENT PHYSICAL THERAPY NEURO TREATMENT   Patient Name: Spencer Stanley MRN: [id number] DOB:1961/03/21, 63 y.o., male Today's Date: 07/12/2023   PCP: Mechele Claude, MD REFERRING PROVIDER: Ward, Lamonte Richer, MD   END OF SESSION:  PT End of Session - 07/12/23 1521     Visit Number 2    Number of Visits 8    Date for PT Re-Evaluation 09/07/23    PT Start Time 1515    PT Stop Time 1559    PT Time Calculation (min) 44 min    Activity Tolerance Patient tolerated treatment well    Behavior During Therapy [facility] for tasks assessed/performed              Past Medical History:  Diagnosis Date   Anxiety    Cirrhosis (HCC)    Cirrhosis of liver (HCC)    Diabetes mellitus without complication (HCC)    Esophageal varices (HCC)    High cholesterol    Lyme disease    [place] spotted fever    Past Surgical History:  Procedure Laterality Date   ESOPHAGEAL VARICE LIGATION     TIPS   HERNIA REPAIR     Patient Active Problem List   Diagnosis Date Noted   Hematochezia 06/27/2020   Tobacco dependence 12/24/2018   Alcoholic cirrhosis of liver without ascites (HCC) 03/21/2018   Anemia 03/21/2018   Esophageal varices in alcoholic cirrhosis (HCC) 03/21/2018   Soreness of tongue 01/23/2017   Elevated lipase 11/11/2015   Hyperammonemia (HCC) 11/11/2015   Thrombocytopenia (HCC) 11/11/2015   Depression 07/16/2015   GERD (gastroesophageal reflux disease) 07/16/2015   Type 2 diabetes mellitus (HCC) 07/16/2015   Encephalopathy, portal systemic (HCC) 06/06/2015   Decompensated hepatic cirrhosis (HCC) 06/06/2015   Type 2 diabetes mellitus without complication (HCC) 12/22/2014   Hyperlipemia 12/22/2014   Cirrhosis (HCC) 09/26/2013    ONSET DATE: "years ago"  REFERRING DIAG: Ataxia, unspecified, Alcohol dependence, in remission, Hepatic encephalopathy   THERAPY DIAG:  Other abnormalities of gait and mobility  Muscle weakness (generalized)  Rationale for Evaluation and  Treatment: Rehabilitation  SUBJECTIVE:                                                                                                                                                                                             SUBJECTIVE STATEMENT: Patient reports that he has not had any problems since his last appointment.  Pt accompanied by: self  PERTINENT HISTORY: cirrhosis, diabetes type 2, encephalopathy, depression, current smoker, and anxiety  PAIN:  Are you having pain? No  PRECAUTIONS: Fall  RED FLAGS: None   WEIGHT BEARING RESTRICTIONS:  No  FALLS: Has patient fallen in last 6 months? Yes. Number of falls 1  LIVING ENVIRONMENT: Lives with: lives with an adult companion Lives in: House/apartment Stairs: No Has following equipment at home: None  OCCUPATION:  [employer]; 8-12 hour shift, primarily walking  PLOF: Independent  PATIENT GOALS: improved balance and safety  NEXT MD FOLLOW UP:  June 2025  OBJECTIVE:  Note: Objective measures were completed at Evaluation unless otherwise noted.  COGNITION: Overall cognitive status: Within functional limits for tasks assessed   SENSATION: Patient reports no numbness or tingling  COORDINATION: Unable to be assessed due to time constraints   LOWER EXTREMITY ROM: WFL for activities assessed  LOWER EXTREMITY MMT:    MMT Right Eval Left Eval  Hip flexion 4/5 4/5  Hip extension    Hip abduction    Hip adduction    Hip internal rotation    Hip external rotation    Knee flexion 4+/5 4+/5  Knee extension 5/5 4+/5  Ankle dorsiflexion    Ankle plantarflexion    Ankle inversion    Ankle eversion    (Blank rows = not tested)  TRANSFERS: Assistive device utilized: None  Sit to stand: Complete Independence Stand to sit: Complete Independence  GAIT: Gait pattern: step through pattern, decreased stride length, Right foot flat, Left foot flat, poor foot clearance- Right, and poor foot clearance- Left Assistive  device utilized: None Level of assistance: Complete Independence Comments:   Gait speed: 1.01 m/s    FUNCTIONAL TESTS:  5 times sit to stand: 18.21 seconds without UE support Timed up and go (TUG): 8.94 seconds on 07/12/23  PATIENT SURVEYS:  ABC scale 69.4% on 07/12/23                                                                                                                              TREATMENT DATE:                                    07/12/23  EXERCISE LOG  Exercise Repetitions and Resistance Comments  Nustep  L4 x 16 minutes   TUG 8.94 seconds   Seated hip ADD isometric  3 minutes w/ 5 second hold   Seated clams  Green t-band x 3 minutes   Sit to stand  10 reps    Rocker board  4 minutes   Marching on foam 3 minutes    Blank cell = exercise not performed today   PATIENT EDUCATION: Education details: plan of care and prognosis Person educated: Patient Education method: Explanation Education comprehension: verbalized understanding  HOME EXERCISE PROGRAM:   GOALS: Goals reviewed with patient? No  LONG TERM GOALS: Target date: 08/06/23  Patient will be independent with his HEP.  Baseline:  Goal status: INITIAL  2.  Patient will improve his 5 times sit to stand time to 15 seconds or less  to reduce his fall risk. Baseline:  Goal status: INITIAL  3.  Patient will be able to report the importance of returning to his previous exercise routine to reduce his risk of fall Baseline:  Goal status: INITIAL  ASSESSMENT:  CLINICAL IMPRESSION: Patient was introduced to multiple new interventions for improved lower extremity strength and stability. His timed up and go and activity balance confidence scale were assessed at 8.94 seconds and 69.4% self confidence, respectively. He required minimal cueing with today's new interventions for proper exercise performance. He reported feeling good upon the conclusion of treatment. He continues to require skilled physical therapy to  address his remaining impairments to maximize his safety and functional mobility.    OBJECTIVE IMPAIRMENTS: Abnormal gait, decreased activity tolerance, decreased balance, decreased mobility, difficulty walking, and decreased strength.   ACTIVITY LIMITATIONS: locomotion level  PARTICIPATION LIMITATIONS: shopping, community activity, and occupation  PERSONAL FACTORS: Past/current experiences, Time since onset of injury/illness/exacerbation, and 3+ comorbidities: cirrhosis, diabetes type 2, encephalopathy, depression, current smoker, and anxiety  are also affecting patient's functional outcome.   REHAB POTENTIAL: Fair    CLINICAL DECISION MAKING: Evolving/moderate complexity  EVALUATION COMPLEXITY: Moderate  PLAN:  PT FREQUENCY: 2x/week  PT DURATION: 4 weeks  PLANNED INTERVENTIONS: 97164- PT Re-evaluation, 97110-Therapeutic exercises, 97530- Therapeutic activity, 97112- Neuromuscular re-education, 97535- Self Care, 29562- Manual therapy, [phone]- Gait training, Patient/Family education, Balance training, and Stair training  PLAN FOR NEXT SESSION: Nustep, static and dynamic balance interventions, and lower extremity strengthening   Granville Lewis, PT 07/12/2023, 4:21 PM

## 2023-07-13 DIAGNOSIS — K7290 Hepatic failure, unspecified without coma: Secondary | ICD-10-CM | POA: Diagnosis not present

## 2023-07-13 DIAGNOSIS — K7460 Unspecified cirrhosis of liver: Secondary | ICD-10-CM | POA: Diagnosis not present

## 2023-07-13 DIAGNOSIS — Z23 Encounter for immunization: Secondary | ICD-10-CM | POA: Diagnosis not present

## 2023-07-18 ENCOUNTER — Ambulatory Visit: Payer: Medicare HMO | Attending: Neurology

## 2023-07-18 DIAGNOSIS — M6281 Muscle weakness (generalized): Secondary | ICD-10-CM | POA: Diagnosis not present

## 2023-07-18 DIAGNOSIS — R2689 Other abnormalities of gait and mobility: Secondary | ICD-10-CM | POA: Diagnosis not present

## 2023-07-18 NOTE — Therapy (Signed)
 OUTPATIENT PHYSICAL THERAPY NEURO TREATMENT   Patient Name: Spencer Stanley MRN: [id number] DOB:1960/11/17, 63 y.o., male Today's Date: 07/18/2023   PCP: Mechele Claude, MD REFERRING PROVIDER: Ward, Lamonte Richer, MD   END OF SESSION:  PT End of Session - 07/18/23 1518     Visit Number 3    Number of Visits 8    Date for PT Re-Evaluation 09/07/23    PT Start Time 1515    PT Stop Time 1557    PT Time Calculation (min) 42 min    Activity Tolerance Patient tolerated treatment well    Behavior During Therapy [facility] for tasks assessed/performed               Past Medical History:  Diagnosis Date   Anxiety    Cirrhosis (HCC)    Cirrhosis of liver (HCC)    Diabetes mellitus without complication (HCC)    Esophageal varices (HCC)    High cholesterol    Lyme disease    [place] spotted fever    Past Surgical History:  Procedure Laterality Date   ESOPHAGEAL VARICE LIGATION     TIPS   HERNIA REPAIR     Patient Active Problem List   Diagnosis Date Noted   Hematochezia 06/27/2020   Tobacco dependence 12/24/2018   Alcoholic cirrhosis of liver without ascites (HCC) 03/21/2018   Anemia 03/21/2018   Esophageal varices in alcoholic cirrhosis (HCC) 03/21/2018   Soreness of tongue 01/23/2017   Elevated lipase 11/11/2015   Hyperammonemia (HCC) 11/11/2015   Thrombocytopenia (HCC) 11/11/2015   Depression 07/16/2015   GERD (gastroesophageal reflux disease) 07/16/2015   Type 2 diabetes mellitus (HCC) 07/16/2015   Encephalopathy, portal systemic (HCC) 06/06/2015   Decompensated hepatic cirrhosis (HCC) 06/06/2015   Type 2 diabetes mellitus without complication (HCC) 12/22/2014   Hyperlipemia 12/22/2014   Cirrhosis (HCC) 09/26/2013    ONSET DATE: "years ago"  REFERRING DIAG: Ataxia, unspecified, Alcohol dependence, in remission, Hepatic encephalopathy   THERAPY DIAG:  Other abnormalities of gait and mobility  Muscle weakness (generalized)  Rationale for Evaluation and  Treatment: Rehabilitation  SUBJECTIVE:                                                                                                                                                                                             SUBJECTIVE STATEMENT: Patient feels "happy as a clam" today. He has not had any problems since his last appointment.  Pt accompanied by: self  PERTINENT HISTORY: cirrhosis, diabetes type 2, encephalopathy, depression, current smoker, and anxiety  PAIN:  Are you having pain? No  PRECAUTIONS: Fall  RED FLAGS: None  WEIGHT BEARING RESTRICTIONS: No  FALLS: Has patient fallen in last 6 months? Yes. Number of falls 1  LIVING ENVIRONMENT: Lives with: lives with an adult companion Lives in: House/apartment Stairs: No Has following equipment at home: None  OCCUPATION:  [employer]; 8-12 hour shift, primarily walking  PLOF: Independent  PATIENT GOALS: improved balance and safety  NEXT MD FOLLOW UP:  June 2025  OBJECTIVE:  Note: Objective measures were completed at Evaluation unless otherwise noted.  COGNITION: Overall cognitive status: Within functional limits for tasks assessed   SENSATION: Patient reports no numbness or tingling  COORDINATION: Unable to be assessed due to time constraints   LOWER EXTREMITY ROM: WFL for activities assessed  LOWER EXTREMITY MMT:    MMT Right Eval Left Eval  Hip flexion 4/5 4/5  Hip extension    Hip abduction    Hip adduction    Hip internal rotation    Hip external rotation    Knee flexion 4+/5 4+/5  Knee extension 5/5 4+/5  Ankle dorsiflexion    Ankle plantarflexion    Ankle inversion    Ankle eversion    (Blank rows = not tested)  TRANSFERS: Assistive device utilized: None  Sit to stand: Complete Independence Stand to sit: Complete Independence  GAIT: Gait pattern: step through pattern, decreased stride length, Right foot flat, Left foot flat, poor foot clearance- Right, and poor foot clearance-  Left Assistive device utilized: None Level of assistance: Complete Independence Comments:   Gait speed: 1.01 m/s    FUNCTIONAL TESTS:  5 times sit to stand: 18.21 seconds without UE support Timed up and go (TUG): 8.94 seconds on 07/12/23  PATIENT SURVEYS:  ABC scale 69.4% on 07/12/23                                                                                                                              TREATMENT DATE:                                     07/18/23 EXERCISE LOG  Exercise Repetitions and Resistance Comments  Nustep  L4 x 18 minutes   Rocker board  5 minutes   Tandem stance on foam  3 x 30 seconds each Intermittent UE support   Side stepping on foam  3 minutes  Intermittent UE support  LAQ 5# x 3 minutes Alternating LE  Sit to stand  15 reps  Without UE support   Blank cell = exercise not performed today                                   07/12/23  EXERCISE LOG  Exercise Repetitions and Resistance Comments  Nustep  L4 x 16 minutes   TUG 8.94 seconds   Seated hip ADD isometric  3 minutes w/ 5  second hold   Seated clams  Green t-band x 3 minutes   Sit to stand  10 reps    Rocker board  4 minutes   Marching on foam 3 minutes    Blank cell = exercise not performed today   PATIENT EDUCATION: Education details: plan of care and prognosis Person educated: Patient Education method: Explanation Education comprehension: verbalized understanding  HOME EXERCISE PROGRAM:   GOALS: Goals reviewed with patient? No  LONG TERM GOALS: Target date: 08/06/23  Patient will be independent with his HEP.  Baseline:  Goal status: INITIAL  2.  Patient will improve his 5 times sit to stand time to 15 seconds or less to reduce his fall risk. Baseline:  Goal status: INITIAL  3.  Patient will be able to report the importance of returning to his previous exercise routine to reduce his risk of fall Baseline:  Goal status: INITIAL  ASSESSMENT:  CLINICAL IMPRESSION: Patient  was progressed with multiple new interventions for improved lower extremity stability on unstable terrain. He required minimal cueing with tandem stance for proper foot positioning to facilitate increased lower extremity demand. He required intermittent upper extremity support with today's standing interventions for maintain his balance. He reported feeling good upon the conclusion of treatment. He continues to require skilled physical therapy to address his remaining impairments to maximize his safety and functional mobility.   OBJECTIVE IMPAIRMENTS: Abnormal gait, decreased activity tolerance, decreased balance, decreased mobility, difficulty walking, and decreased strength.   ACTIVITY LIMITATIONS: locomotion level  PARTICIPATION LIMITATIONS: shopping, community activity, and occupation  PERSONAL FACTORS: Past/current experiences, Time since onset of injury/illness/exacerbation, and 3+ comorbidities: cirrhosis, diabetes type 2, encephalopathy, depression, current smoker, and anxiety  are also affecting patient's functional outcome.   REHAB POTENTIAL: Fair    CLINICAL DECISION MAKING: Evolving/moderate complexity  EVALUATION COMPLEXITY: Moderate  PLAN:  PT FREQUENCY: 2x/week  PT DURATION: 4 weeks  PLANNED INTERVENTIONS: 97164- PT Re-evaluation, 97110-Therapeutic exercises, 97530- Therapeutic activity, 97112- Neuromuscular re-education, 97535- Self Care, 62952- Manual therapy, [phone]- Gait training, Patient/Family education, Balance training, and Stair training  PLAN FOR NEXT SESSION: Nustep, static and dynamic balance interventions, and lower extremity strengthening   Granville Lewis, PT 07/18/2023, 3:58 PM

## 2023-07-25 ENCOUNTER — Ambulatory Visit

## 2023-07-26 ENCOUNTER — Ambulatory Visit

## 2023-07-26 DIAGNOSIS — R2689 Other abnormalities of gait and mobility: Secondary | ICD-10-CM

## 2023-07-26 DIAGNOSIS — M6281 Muscle weakness (generalized): Secondary | ICD-10-CM

## 2023-07-26 NOTE — Therapy (Signed)
 OUTPATIENT PHYSICAL THERAPY NEURO TREATMENT   Patient Name: Spencer Stanley MRN: [id number] DOB:[birth year], 63 y.o., male Today's Date: 07/26/2023   PCP: Mechele Claude, MD REFERRING PROVIDER: Ward, Lamonte Richer, MD   END OF SESSION:  PT End of Session - 07/26/23 1608     Visit Number 4    Number of Visits 8    Date for PT Re-Evaluation 09/07/23    PT Start Time 1600    PT Stop Time 1643    PT Time Calculation (min) 43 min    Activity Tolerance Patient tolerated treatment well    Behavior During Therapy [facility] for tasks assessed/performed               Past Medical History:  Diagnosis Date   Anxiety    Cirrhosis (HCC)    Cirrhosis of liver (HCC)    Diabetes mellitus without complication (HCC)    Esophageal varices (HCC)    High cholesterol    Lyme disease    [place]y [place] spotted fever    Past Surgical History:  Procedure Laterality Date   ESOPHAGEAL VARICE LIGATION     TIPS   HERNIA REPAIR     Patient Active Problem List   Diagnosis Date Noted   Hematochezia 06/27/2020   Tobacco dependence 12/24/2018   Alcoholic cirrhosis of liver without ascites (HCC) 03/21/2018   Anemia 03/21/2018   Esophageal varices in alcoholic cirrhosis (HCC) 03/21/2018   Soreness of tongue 01/23/2017   Elevated lipase 11/11/2015   Hyperammonemia (HCC) 11/11/2015   Thrombocytopenia (HCC) 11/11/2015   Depression 07/16/2015   GERD (gastroesophageal reflux disease) 07/16/2015   Type 2 diabetes mellitus (HCC) 07/16/2015   Encephalopathy, portal systemic (HCC) 06/06/2015   Decompensated hepatic cirrhosis (HCC) 06/06/2015   Type 2 diabetes mellitus without complication (HCC) 12/22/2014   Hyperlipemia 12/22/2014   Cirrhosis (HCC) 09/26/2013    ONSET DATE: "years ago"  REFERRING DIAG: Ataxia, unspecified, Alcohol dependence, in remission, Hepatic encephalopathy   THERAPY DIAG:  Other abnormalities of gait and mobility  Muscle weakness (generalized)  Rationale for Evaluation and  Treatment: Rehabilitation  SUBJECTIVE:                                                                                                                                                                                             SUBJECTIVE STATEMENT: Pt denies any pain today, states he's "happy to be here."  Pt accompanied by: self  PERTINENT HISTORY: cirrhosis, diabetes type 2, encephalopathy, depression, current smoker, and anxiety  PAIN:  Are you having pain? No  PRECAUTIONS: Fall  RED FLAGS: None   WEIGHT BEARING RESTRICTIONS: No  FALLS: Has patient fallen in last 6 months? Yes. Number of falls 1  LIVING ENVIRONMENT: Lives with: lives with an adult companion Lives in: House/apartment Stairs: No Has following equipment at home: None  OCCUPATION:  [employer]; 8-12 hour shift, primarily walking  PLOF: Independent  PATIENT GOALS: improved balance and safety  NEXT MD FOLLOW UP:  June 2025  OBJECTIVE:  Note: Objective measures were completed at Evaluation unless otherwise noted.  COGNITION: Overall cognitive status: Within functional limits for tasks assessed   SENSATION: Patient reports no numbness or tingling  COORDINATION: Unable to be assessed due to time constraints   LOWER EXTREMITY ROM: WFL for activities assessed  LOWER EXTREMITY MMT:    MMT Right Eval Left Eval  Hip flexion 4/5 4/5  Hip extension    Hip abduction    Hip adduction    Hip internal rotation    Hip external rotation    Knee flexion 4+/5 4+/5  Knee extension 5/5 4+/5  Ankle dorsiflexion    Ankle plantarflexion    Ankle inversion    Ankle eversion    (Blank rows = not tested)  TRANSFERS: Assistive device utilized: None  Sit to stand: Complete Independence Stand to sit: Complete Independence  GAIT: Gait pattern: step through pattern, decreased stride length, Right foot flat, Left foot flat, poor foot clearance- Right, and poor foot clearance- Left Assistive device utilized:  None Level of assistance: Complete Independence Comments:   Gait speed: 1.01 m/s    FUNCTIONAL TESTS:  5 times sit to stand: 18.21 seconds without UE support Timed up and go (TUG): 8.94 seconds on 07/12/23  PATIENT SURVEYS:  ABC scale 69.4% on 07/12/23                                                                                                                              TREATMENT DATE:                                     07/26/23 EXERCISE LOG  Exercise Repetitions and Resistance Comments  Nustep  L4 x 18 minutes   Rocker board  5 minutes   Side-stepping Airex x 3 mins   Tandem Gait Airex x 3 mins   Tandem stance on foam   Intermittent UE support   Seated Marches 5# x 3.5 mins Intermittent UE support  LAQ 5# x 3.5 minutes Alternating LE  Sit to stand  15 reps  Without UE support   Blank cell = exercise not performed today                                   07/12/23  EXERCISE LOG  Exercise Repetitions and Resistance Comments  Nustep  L4 x 16 minutes   TUG 8.94 seconds   Seated hip ADD isometric  3 minutes w/ 5 second hold   Seated clams  Green t-band x 3 minutes   Sit to stand  10 reps    Rocker board  4 minutes   Marching on foam 3 minutes    Blank cell = exercise not performed today   PATIENT EDUCATION: Education details: plan of care and prognosis Person educated: Patient Education method: Explanation Education comprehension: verbalized understanding  HOME EXERCISE PROGRAM:   GOALS: Goals reviewed with patient? No  LONG TERM GOALS: Target date: 08/06/23  Patient will be independent with his HEP.  Baseline:  Goal status: INITIAL  2.  Patient will improve his 5 times sit to stand time to 15 seconds or less to reduce his fall risk. Baseline:  Goal status: INITIAL  3.  Patient will be able to report the importance of returning to his previous exercise routine to reduce his risk of fall Baseline:  Goal status: INITIAL  ASSESSMENT:  CLINICAL IMPRESSION:   Pt arrives for today's treatment session denying any pain.  Pt able to tolerate increased time with various exercises today.  Pt introduced to tandem gait on Airex balance beam with increased requirement of UE support for safety and stability.  Pt denied any pain at completion of today's treatment session.    OBJECTIVE IMPAIRMENTS: Abnormal gait, decreased activity tolerance, decreased balance, decreased mobility, difficulty walking, and decreased strength.   ACTIVITY LIMITATIONS: locomotion level  PARTICIPATION LIMITATIONS: shopping, community activity, and occupation  PERSONAL FACTORS: Past/current experiences, Time since onset of injury/illness/exacerbation, and 3+ comorbidities: cirrhosis, diabetes type 2, encephalopathy, depression, current smoker, and anxiety  are also affecting patient's functional outcome.   REHAB POTENTIAL: Fair    CLINICAL DECISION MAKING: Evolving/moderate complexity  EVALUATION COMPLEXITY: Moderate  PLAN:  PT FREQUENCY: 2x/week  PT DURATION: 4 weeks  PLANNED INTERVENTIONS: 97164- PT Re-evaluation, 97110-Therapeutic exercises, 97530- Therapeutic activity, 97112- Neuromuscular re-education, 97535- Self Care, 40981- Manual therapy, [phone]- Gait training, Patient/Family education, Balance training, and Stair training  PLAN FOR NEXT SESSION: Nustep, static and dynamic balance interventions, and lower extremity strengthening  Newman Pies, PTA 07/26/2023, 4:47 PM

## 2023-08-02 ENCOUNTER — Ambulatory Visit

## 2023-08-02 DIAGNOSIS — M6281 Muscle weakness (generalized): Secondary | ICD-10-CM

## 2023-08-02 DIAGNOSIS — R2689 Other abnormalities of gait and mobility: Secondary | ICD-10-CM | POA: Diagnosis not present

## 2023-08-02 NOTE — Therapy (Signed)
 OUTPATIENT PHYSICAL THERAPY NEURO TREATMENT   Patient Name: Spencer Stanley MRN: [id number] DOB:Dec 16, 1960, 63 y.o., male Today's Date: 08/02/2023   PCP: Mechele Claude, MD REFERRING PROVIDER: Ward, Lamonte Richer, MD   END OF SESSION:  PT End of Session - 08/02/23 1405     Visit Number 5    Number of Visits 8    Date for PT Re-Evaluation 09/07/23    PT Start Time 1348    PT Stop Time 1430    PT Time Calculation (min) 42 min    Activity Tolerance Patient tolerated treatment well    Behavior During Therapy [facility] for tasks assessed/performed                Past Medical History:  Diagnosis Date   Anxiety    Cirrhosis (HCC)    Cirrhosis of liver (HCC)    Diabetes mellitus without complication (HCC)    Esophageal varices (HCC)    High cholesterol    Lyme disease    [place]y [place] spotted fever    Past Surgical History:  Procedure Laterality Date   ESOPHAGEAL VARICE LIGATION     TIPS   HERNIA REPAIR     Patient Active Problem List   Diagnosis Date Noted   Hematochezia 06/27/2020   Tobacco dependence 12/24/2018   Alcoholic cirrhosis of liver without ascites (HCC) 03/21/2018   Anemia 03/21/2018   Esophageal varices in alcoholic cirrhosis (HCC) 03/21/2018   Soreness of tongue 01/23/2017   Elevated lipase 11/11/2015   Hyperammonemia (HCC) 11/11/2015   Thrombocytopenia (HCC) 11/11/2015   Depression 07/16/2015   GERD (gastroesophageal reflux disease) 07/16/2015   Type 2 diabetes mellitus (HCC) 07/16/2015   Encephalopathy, portal systemic (HCC) 06/06/2015   Decompensated hepatic cirrhosis (HCC) 06/06/2015   Type 2 diabetes mellitus without complication (HCC) 12/22/2014   Hyperlipemia 12/22/2014   Cirrhosis (HCC) 09/26/2013    ONSET DATE: "years ago"  REFERRING DIAG: Ataxia, unspecified, Alcohol dependence, in remission, Hepatic encephalopathy   THERAPY DIAG:  Other abnormalities of gait and mobility  Muscle weakness (generalized)  Rationale for Evaluation and  Treatment: Rehabilitation  SUBJECTIVE:                                                                                                                                                                                             SUBJECTIVE STATEMENT: Patient reports that he feels good today.   Pt accompanied by: self  PERTINENT HISTORY: cirrhosis, diabetes type 2, encephalopathy, depression, current smoker, and anxiety  PAIN:  Are you having pain? No  PRECAUTIONS: Fall  RED FLAGS: None   WEIGHT BEARING RESTRICTIONS: No  FALLS:  Has patient fallen in last 6 months? Yes. Number of falls 1  LIVING ENVIRONMENT: Lives with: lives with an adult companion Lives in: House/apartment Stairs: No Has following equipment at home: None  OCCUPATION:  [employer]; 8-12 hour shift, primarily walking  PLOF: Independent  PATIENT GOALS: improved balance and safety  NEXT MD FOLLOW UP:  June 2025  OBJECTIVE:  Note: Objective measures were completed at Evaluation unless otherwise noted.  COGNITION: Overall cognitive status: Within functional limits for tasks assessed   SENSATION: Patient reports no numbness or tingling  COORDINATION: Unable to be assessed due to time constraints   LOWER EXTREMITY ROM: WFL for activities assessed  LOWER EXTREMITY MMT:    MMT Right Eval Left Eval  Hip flexion 4/5 4/5  Hip extension    Hip abduction    Hip adduction    Hip internal rotation    Hip external rotation    Knee flexion 4+/5 4+/5  Knee extension 5/5 4+/5  Ankle dorsiflexion    Ankle plantarflexion    Ankle inversion    Ankle eversion    (Blank rows = not tested)  TRANSFERS: Assistive device utilized: None  Sit to stand: Complete Independence Stand to sit: Complete Independence  GAIT: Gait pattern: step through pattern, decreased stride length, Right foot flat, Left foot flat, poor foot clearance- Right, and poor foot clearance- Left Assistive device utilized: None Level of  assistance: Complete Independence Comments:   Gait speed: 1.01 m/s    FUNCTIONAL TESTS:  5 times sit to stand: 18.21 seconds without UE support Timed up and go (TUG): 8.94 seconds on 07/12/23  PATIENT SURVEYS:  ABC scale 69.4% on 07/12/23                                                                                                                              TREATMENT DATE:                                     08/02/23 EXERCISE LOG  Exercise Repetitions and Resistance Comments  Nustep  L4 x 20 minutes   Rocker board  5 minutes BUE support  Cone taps  3 minutes  3 cone taps   Step ups  20 reps each  Airex to 8' step   Sit to stand  20 reps  Without UE support  Seated hip ADD isometric  2 minutes w/ 5 second hold    Blank cell = exercise not performed today                                    07/26/23 EXERCISE LOG  Exercise Repetitions and Resistance Comments  Nustep  L4 x 18 minutes   Rocker board  5 minutes   Side-stepping Airex x 3 mins   Tandem Gait Airex  x 3 mins   Tandem stance on foam   Intermittent UE support   Seated Marches 5# x 3.5 mins Intermittent UE support  LAQ 5# x 3.5 minutes Alternating LE  Sit to stand  15 reps  Without UE support   Blank cell = exercise not performed today                                   07/12/23  EXERCISE LOG  Exercise Repetitions and Resistance Comments  Nustep  L4 x 16 minutes   TUG 8.94 seconds   Seated hip ADD isometric  3 minutes w/ 5 second hold   Seated clams  Green t-band x 3 minutes   Sit to stand  10 reps    Rocker board  4 minutes   Marching on foam 3 minutes    Blank cell = exercise not performed today   PATIENT EDUCATION: Education details: plan of care and prognosis Person educated: Patient Education method: Explanation Education comprehension: verbalized understanding  HOME EXERCISE PROGRAM:   GOALS: Goals reviewed with patient? No  LONG TERM GOALS: Target date: 08/06/23  Patient will be independent with  his HEP.  Baseline:  Goal status: INITIAL  2.  Patient will improve his 5 times sit to stand time to 15 seconds or less to reduce his fall risk. Baseline:  Goal status: INITIAL  3.  Patient will be able to report the importance of returning to his previous exercise routine to reduce his risk of fall Baseline:  Goal status: INITIAL  ASSESSMENT:  CLINICAL IMPRESSION:  Patient was progressed with multiple new interventions for improved lower extremity stability. He required minimal cueing with today's new interventions for proper exercise performance. He required close supervision and intermittent minA for safety to prevent a loss of balance. He reported feeling good upon the conclusion of treatment. He continues to require skilled physical therapy to address his remaining impairments to maximize his safety and functional mobility.   OBJECTIVE IMPAIRMENTS: Abnormal gait, decreased activity tolerance, decreased balance, decreased mobility, difficulty walking, and decreased strength.   ACTIVITY LIMITATIONS: locomotion level  PARTICIPATION LIMITATIONS: shopping, community activity, and occupation  PERSONAL FACTORS: Past/current experiences, Time since onset of injury/illness/exacerbation, and 3+ comorbidities: cirrhosis, diabetes type 2, encephalopathy, depression, current smoker, and anxiety  are also affecting patient's functional outcome.   REHAB POTENTIAL: Fair    CLINICAL DECISION MAKING: Evolving/moderate complexity  EVALUATION COMPLEXITY: Moderate  PLAN:  PT FREQUENCY: 2x/week  PT DURATION: 4 weeks  PLANNED INTERVENTIONS: 97164- PT Re-evaluation, 97110-Therapeutic exercises, 97530- Therapeutic activity, 97112- Neuromuscular re-education, 97535- Self Care, 16109- Manual therapy, [phone]- Gait training, Patient/Family education, Balance training, and Stair training  PLAN FOR NEXT SESSION: Nustep, static and dynamic balance interventions, and lower extremity  strengthening  Granville Lewis, PT 08/02/2023, 4:25 PM

## 2023-08-09 ENCOUNTER — Ambulatory Visit (HOSPITAL_COMMUNITY)
Admission: RE | Admit: 2023-08-09 | Discharge: 2023-08-09 | Disposition: A | Discharge status: Home / Self Care | Payer: Medicare HMO | Source: Ambulatory Visit | Attending: Oncology | Admitting: Oncology

## 2023-08-09 ENCOUNTER — Ambulatory Visit

## 2023-08-09 DIAGNOSIS — M6281 Muscle weakness (generalized): Secondary | ICD-10-CM

## 2023-08-09 DIAGNOSIS — Z87891 Personal history of nicotine dependence: Secondary | ICD-10-CM | POA: Diagnosis not present

## 2023-08-09 DIAGNOSIS — Z122 Encounter for screening for malignant neoplasm of respiratory organs: Secondary | ICD-10-CM | POA: Insufficient documentation

## 2023-08-09 DIAGNOSIS — R2689 Other abnormalities of gait and mobility: Secondary | ICD-10-CM

## 2023-08-09 DIAGNOSIS — F17210 Nicotine dependence, cigarettes, uncomplicated: Secondary | ICD-10-CM | POA: Diagnosis not present

## 2023-08-09 NOTE — Therapy (Signed)
 OUTPATIENT PHYSICAL THERAPY NEURO TREATMENT   Patient Name: Spencer Stanley MRN: [id number] DOB:Dec 11, 1960, 63 y.o., male Today's Date: 08/09/2023   PCP: Mechele Claude, MD REFERRING PROVIDER: Ward, Lamonte Richer, MD   END OF SESSION:  PT End of Session - 08/09/23 1401     Visit Number 6    Number of Visits 8    Date for PT Re-Evaluation 09/07/23    PT Start Time 1345    PT Stop Time 1431    PT Time Calculation (min) 46 min    Activity Tolerance Patient tolerated treatment well    Behavior During Therapy [facility] for tasks assessed/performed                Past Medical History:  Diagnosis Date   Anxiety    Cirrhosis (HCC)    Cirrhosis of liver (HCC)    Diabetes mellitus without complication (HCC)    Esophageal varices (HCC)    High cholesterol    Lyme disease    [place] spotted fever    Past Surgical History:  Procedure Laterality Date   ESOPHAGEAL VARICE LIGATION     TIPS   HERNIA REPAIR     Patient Active Problem List   Diagnosis Date Noted   Hematochezia 06/27/2020   Tobacco dependence 12/24/2018   Alcoholic cirrhosis of liver without ascites (HCC) 03/21/2018   Anemia 03/21/2018   Esophageal varices in alcoholic cirrhosis (HCC) 03/21/2018   Soreness of tongue 01/23/2017   Elevated lipase 11/11/2015   Hyperammonemia (HCC) 11/11/2015   Thrombocytopenia (HCC) 11/11/2015   Depression 07/16/2015   GERD (gastroesophageal reflux disease) 07/16/2015   Type 2 diabetes mellitus (HCC) 07/16/2015   Encephalopathy, portal systemic (HCC) 06/06/2015   Decompensated hepatic cirrhosis (HCC) 06/06/2015   Type 2 diabetes mellitus without complication (HCC) 12/22/2014   Hyperlipemia 12/22/2014   Cirrhosis (HCC) 09/26/2013    ONSET DATE: "years ago"  REFERRING DIAG: Ataxia, unspecified, Alcohol dependence, in remission, Hepatic encephalopathy   THERAPY DIAG:  Other abnormalities of gait and mobility  Muscle weakness (generalized)  Rationale for Evaluation and  Treatment: Rehabilitation  SUBJECTIVE:                                                                                                                                                                                             SUBJECTIVE STATEMENT: Patient reports that he feels good today.   Pt accompanied by: self  PERTINENT HISTORY: cirrhosis, diabetes type 2, encephalopathy, depression, current smoker, and anxiety  PAIN:  Are you having pain? No  PRECAUTIONS: Fall  RED FLAGS: None   WEIGHT BEARING RESTRICTIONS: No  FALLS:  Has patient fallen in last 6 months? Yes. Number of falls 1  LIVING ENVIRONMENT: Lives with: lives with an adult companion Lives in: House/apartment Stairs: No Has following equipment at home: None  OCCUPATION:  [employer]; 8-12 hour shift, primarily walking  PLOF: Independent  PATIENT GOALS: improved balance and safety  NEXT MD FOLLOW UP:  June 2025  OBJECTIVE:  Note: Objective measures were completed at Evaluation unless otherwise noted.  COGNITION: Overall cognitive status: Within functional limits for tasks assessed   SENSATION: Patient reports no numbness or tingling  COORDINATION: Unable to be assessed due to time constraints   LOWER EXTREMITY ROM: WFL for activities assessed  LOWER EXTREMITY MMT:    MMT Right Eval Left Eval  Hip flexion 4/5 4/5  Hip extension    Hip abduction    Hip adduction    Hip internal rotation    Hip external rotation    Knee flexion 4+/5 4+/5  Knee extension 5/5 4+/5  Ankle dorsiflexion    Ankle plantarflexion    Ankle inversion    Ankle eversion    (Blank rows = not tested)  TRANSFERS: Assistive device utilized: None  Sit to stand: Complete Independence Stand to sit: Complete Independence  GAIT: Gait pattern: step through pattern, decreased stride length, Right foot flat, Left foot flat, poor foot clearance- Right, and poor foot clearance- Left Assistive device utilized: None Level of  assistance: Complete Independence Comments:   Gait speed: 1.01 m/s    FUNCTIONAL TESTS:  5 times sit to stand: 18.21 seconds without UE support Timed up and go (TUG): 8.94 seconds on 07/12/23  PATIENT SURVEYS:  ABC scale 69.4% on 07/12/23                                                                                                                              TREATMENT DATE:                                     08/09/23 EXERCISE LOG  Exercise Repetitions and Resistance Comments  Nustep  L4 x 20 minutes   Rocker board  5 minutes BUE support  Side-stepping Airex x 3.5 mins   Tandem Gait Airex x 3.5 mins   Cybex Knee Flexion 40# x 3 mins   Cybex Knee Extension 20# x 3 mins   Cone taps   3 cone taps   Step ups   Airex to 8' step   Sit to stand   Without UE support  Seated hip ADD isometric      Blank cell = exercise not performed today                                    07/26/23 EXERCISE LOG  Exercise Repetitions and Resistance Comments  Nustep  L4 x  18 minutes   Rocker board  5 minutes   Side-stepping Airex x 3 mins   Tandem Gait Airex x 3 mins   Tandem stance on foam   Intermittent UE support   Seated Marches 5# x 3.5 mins Intermittent UE support  LAQ 5# x 3.5 minutes Alternating LE  Sit to stand  15 reps  Without UE support   Blank cell = exercise not performed today                                   07/12/23  EXERCISE LOG  Exercise Repetitions and Resistance Comments  Nustep  L4 x 16 minutes   TUG 8.94 seconds   Seated hip ADD isometric  3 minutes w/ 5 second hold   Seated clams  Green t-band x 3 minutes   Sit to stand  10 reps    Rocker board  4 minutes   Marching on foam 3 minutes    Blank cell = exercise not performed today   PATIENT EDUCATION: Education details: plan of care and prognosis Person educated: Patient Education method: Explanation Education comprehension: verbalized understanding  HOME EXERCISE PROGRAM:   GOALS: Goals reviewed with patient?  No  LONG TERM GOALS: Target date: 08/06/23  Patient will be independent with his HEP.  Baseline:  Goal status: IN PROGRESS   2.  Patient will improve his 5 times sit to stand time to 15 seconds or less to reduce his fall risk. Baseline: 3/27: 13.24 seconds Goal status: MET  3.  Patient will be able to report the importance of returning to his previous exercise routine to reduce his risk of fall Baseline:  Goal status: IN PROGRESS  ASSESSMENT:  CLINICAL IMPRESSION: Pt arrives for today's treatment session denying any pain.  Pt able to tolerate increased time on Airex balance beam today today with pt requiring intermittent single UE support with side-stepping and constant single finger contact with tandem gait.  Pt introduced to cybex knee flexion and extension machinery to increase BLE strength and function.  Pt requiring min cues for eccentric control with beginning reps.  Pt denied any pain at completion of today's treatment session.   OBJECTIVE IMPAIRMENTS: Abnormal gait, decreased activity tolerance, decreased balance, decreased mobility, difficulty walking, and decreased strength.   ACTIVITY LIMITATIONS: locomotion level  PARTICIPATION LIMITATIONS: shopping, community activity, and occupation  PERSONAL FACTORS: Past/current experiences, Time since onset of injury/illness/exacerbation, and 3+ comorbidities: cirrhosis, diabetes type 2, encephalopathy, depression, current smoker, and anxiety  are also affecting patient's functional outcome.   REHAB POTENTIAL: Fair    CLINICAL DECISION MAKING: Evolving/moderate complexity  EVALUATION COMPLEXITY: Moderate  PLAN:  PT FREQUENCY: 2x/week  PT DURATION: 4 weeks  PLANNED INTERVENTIONS: 97164- PT Re-evaluation, 97110-Therapeutic exercises, 97530- Therapeutic activity, 97112- Neuromuscular re-education, 97535- Self Care, 44010- Manual therapy, [phone]- Gait training, Patient/Family education, Balance training, and Stair training  PLAN  FOR NEXT SESSION: Nustep, static and dynamic balance interventions, and lower extremity strengthening  Newman Pies, PTA 08/09/2023, 2:35 PM

## 2023-08-16 ENCOUNTER — Ambulatory Visit: Attending: Neurology

## 2023-08-16 DIAGNOSIS — R2689 Other abnormalities of gait and mobility: Secondary | ICD-10-CM | POA: Insufficient documentation

## 2023-08-16 DIAGNOSIS — M6281 Muscle weakness (generalized): Secondary | ICD-10-CM | POA: Insufficient documentation

## 2023-08-16 NOTE — Therapy (Signed)
 OUTPATIENT PHYSICAL THERAPY NEURO TREATMENT   Patient Name: Spencer Stanley MRN: [id number] DOB:08/10/1960, 63 y.o., male Today's Date: 08/16/2023   PCP: Mechele Claude, MD REFERRING PROVIDER: Ward, Lamonte Richer, MD   END OF SESSION:  PT End of Session - 08/16/23 1520     Visit Number 7    Number of Visits 8    Date for PT Re-Evaluation 09/07/23    PT Start Time 1518   Patient arrived late to his appointment.   PT Stop Time 1557    PT Time Calculation (min) 39 min    Activity Tolerance Patient tolerated treatment well    Behavior During Therapy WFL for tasks assessed/performed                 Past Medical History:  Diagnosis Date   Anxiety    Cirrhosis (HCC)    Cirrhosis of liver (HCC)    Diabetes mellitus without complication (HCC)    Esophageal varices (HCC)    High cholesterol    Lyme disease    [place] spotted fever    Past Surgical History:  Procedure Laterality Date   ESOPHAGEAL VARICE LIGATION     TIPS   HERNIA REPAIR     Patient Active Problem List   Diagnosis Date Noted   Hematochezia 06/27/2020   Tobacco dependence 12/24/2018   Alcoholic cirrhosis of liver without ascites (HCC) 03/21/2018   Anemia 03/21/2018   Esophageal varices in alcoholic cirrhosis (HCC) 03/21/2018   Soreness of tongue 01/23/2017   Elevated lipase 11/11/2015   Hyperammonemia (HCC) 11/11/2015   Thrombocytopenia (HCC) 11/11/2015   Depression 07/16/2015   GERD (gastroesophageal reflux disease) 07/16/2015   Type 2 diabetes mellitus (HCC) 07/16/2015   Encephalopathy, portal systemic (HCC) 06/06/2015   Decompensated hepatic cirrhosis (HCC) 06/06/2015   Type 2 diabetes mellitus without complication (HCC) 12/22/2014   Hyperlipemia 12/22/2014   Cirrhosis (HCC) 09/26/2013    ONSET DATE: "years ago"  REFERRING DIAG: Ataxia, unspecified, Alcohol dependence, in remission, Hepatic encephalopathy   THERAPY DIAG:  Other abnormalities of gait and mobility  Muscle weakness  (generalized)  Rationale for Evaluation and Treatment: Rehabilitation  SUBJECTIVE:                                                                                                                                                                                             SUBJECTIVE STATEMENT: Patient reports that he feels good today. He has not had any problems since his last appointment. He was planning on going to the gym this week, but he has not been able to do this yet.   Pt accompanied  by: self  PERTINENT HISTORY: cirrhosis, diabetes type 2, encephalopathy, depression, current smoker, and anxiety  PAIN:  Are you having pain? No  PRECAUTIONS: Fall  RED FLAGS: None   WEIGHT BEARING RESTRICTIONS: No  FALLS: Has patient fallen in last 6 months? Yes. Number of falls 1  LIVING ENVIRONMENT: Lives with: lives with an adult companion Lives in: House/apartment Stairs: No Has following equipment at home: None  OCCUPATION:  [employer]; 8-12 hour shift, primarily walking  PLOF: Independent  PATIENT GOALS: improved balance and safety  NEXT MD FOLLOW UP:  June 2025  OBJECTIVE:  Note: Objective measures were completed at Evaluation unless otherwise noted.  COGNITION: Overall cognitive status: Within functional limits for tasks assessed   SENSATION: Patient reports no numbness or tingling  COORDINATION: Unable to be assessed due to time constraints   LOWER EXTREMITY ROM: WFL for activities assessed  LOWER EXTREMITY MMT:    MMT Right Eval Left Eval  Hip flexion 4/5 4/5  Hip extension    Hip abduction    Hip adduction    Hip internal rotation    Hip external rotation    Knee flexion 4+/5 4+/5  Knee extension 5/5 4+/5  Ankle dorsiflexion    Ankle plantarflexion    Ankle inversion    Ankle eversion    (Blank rows = not tested)  TRANSFERS: Assistive device utilized: None  Sit to stand: Complete Independence Stand to sit: Complete Independence  GAIT: Gait  pattern: step through pattern, decreased stride length, Right foot flat, Left foot flat, poor foot clearance- Right, and poor foot clearance- Left Assistive device utilized: None Level of assistance: Complete Independence Comments:   Gait speed: 1.01 m/s    FUNCTIONAL TESTS:  5 times sit to stand: 18.21 seconds without UE support Timed up and go (TUG): 8.94 seconds on 07/12/23  PATIENT SURVEYS:  ABC scale 69.4% on 07/12/23                                                                                                                              TREATMENT DATE:                                     08/16/23 EXERCISE LOG  Exercise Repetitions and Resistance Comments  Nustep  L4 x 20 minutes   Cybex knee flexion  50# x 2 minutes   Cybex knee extension 30# x 3 minutes   Rocker board  4 minutes x 1 minute  AP and lateral   Sit to stand  20 reps  Without UE support   Blank cell = exercise not performed today                                    08/09/23 EXERCISE LOG  Exercise Repetitions and Resistance Comments  Nustep  L4 x 20 minutes   Rocker board  5 minutes BUE support  Side-stepping Airex x 3.5 mins   Tandem Gait Airex x 3.5 mins   Cybex Knee Flexion 40# x 3 mins   Cybex Knee Extension 20# x 3 mins   Cone taps   3 cone taps   Step ups   Airex to 8' step   Sit to stand   Without UE support  Seated hip ADD isometric      Blank cell = exercise not performed today                                    07/26/23 EXERCISE LOG  Exercise Repetitions and Resistance Comments  Nustep  L4 x 18 minutes   Rocker board  5 minutes   Side-stepping Airex x 3 mins   Tandem Gait Airex x 3 mins   Tandem stance on foam   Intermittent UE support   Seated Marches 5# x 3.5 mins Intermittent UE support  LAQ 5# x 3.5 minutes Alternating LE  Sit to stand  15 reps  Without UE support   Blank cell = exercise not performed today   PATIENT EDUCATION: Education details: plan of care and prognosis Person  educated: Patient Education method: Explanation Education comprehension: verbalized understanding  HOME EXERCISE PROGRAM:   GOALS: Goals reviewed with patient? No  LONG TERM GOALS: Target date: 08/06/23  Patient will be independent with his HEP.  Baseline:  Goal status: IN PROGRESS   2.  Patient will improve his 5 times sit to stand time to 15 seconds or less to reduce his fall risk. Baseline: 3/27: 13.24 seconds Goal status: MET  3.  Patient will be able to report the importance of returning to his previous exercise routine to reduce his risk of fall Baseline:  Goal status: IN PROGRESS  ASSESSMENT:  CLINICAL IMPRESSION: Patient was progressed with multiple familiar interventions for improved lower extremity muscular strength. He required minimal cueing with eccentric quadriceps control for improved function with activities such as descending stairs. His HEP was reviewed and he was educated on was that he can modify his interventions at home. He reported feeling good upon the conclusion of treatment. He continues to require skilled physical therapy to address his remaining impairments to maximize his safety and functional mobility.   OBJECTIVE IMPAIRMENTS: Abnormal gait, decreased activity tolerance, decreased balance, decreased mobility, difficulty walking, and decreased strength.   ACTIVITY LIMITATIONS: locomotion level  PARTICIPATION LIMITATIONS: shopping, community activity, and occupation  PERSONAL FACTORS: Past/current experiences, Time since onset of injury/illness/exacerbation, and 3+ comorbidities: cirrhosis, diabetes type 2, encephalopathy, depression, current smoker, and anxiety  are also affecting patient's functional outcome.   REHAB POTENTIAL: Fair    CLINICAL DECISION MAKING: Evolving/moderate complexity  EVALUATION COMPLEXITY: Moderate  PLAN:  PT FREQUENCY: 2x/week  PT DURATION: 4 weeks  PLANNED INTERVENTIONS: 97164- PT Re-evaluation, 97110-Therapeutic  exercises, 97530- Therapeutic activity, 97112- Neuromuscular re-education, 97535- Self Care, 14782- Manual therapy, [phone]- Gait training, Patient/Family education, Balance training, and Stair training  PLAN FOR NEXT SESSION: Nustep, static and dynamic balance interventions, and lower extremity strengthening  Granville Lewis, PT 08/16/2023, 4:42 PM

## 2023-08-23 ENCOUNTER — Ambulatory Visit

## 2023-09-17 ENCOUNTER — Other Ambulatory Visit: Payer: Self-pay | Admitting: Family Medicine

## 2023-10-06 DIAGNOSIS — H52223 Regular astigmatism, bilateral: Secondary | ICD-10-CM | POA: Diagnosis not present

## 2023-10-15 ENCOUNTER — Ambulatory Visit: Payer: Self-pay

## 2023-10-15 ENCOUNTER — Ambulatory Visit (INDEPENDENT_AMBULATORY_CARE_PROVIDER_SITE_OTHER): Payer: Medicare HMO | Admitting: Urology

## 2023-10-15 VITALS — BP 101/70 | HR 84

## 2023-10-15 DIAGNOSIS — N401 Enlarged prostate with lower urinary tract symptoms: Secondary | ICD-10-CM | POA: Diagnosis not present

## 2023-10-15 DIAGNOSIS — R351 Nocturia: Secondary | ICD-10-CM | POA: Diagnosis not present

## 2023-10-15 DIAGNOSIS — N138 Other obstructive and reflux uropathy: Secondary | ICD-10-CM

## 2023-10-15 LAB — URINALYSIS, ROUTINE W REFLEX MICROSCOPIC
Bilirubin, UA: NEGATIVE
Glucose, UA: NEGATIVE
Nitrite, UA: NEGATIVE
Specific Gravity, UA: 1.025 (ref 1.005–1.030)
Urobilinogen, Ur: 4 mg/dL — ABNORMAL HIGH (ref 0.2–1.0)
pH, UA: 6.5 (ref 5.0–7.5)

## 2023-10-15 LAB — MICROSCOPIC EXAMINATION: Bacteria, UA: NONE SEEN

## 2023-10-15 MED ORDER — TADALAFIL 5 MG PO TABS: 5.0000 mg | ORAL_TABLET | Freq: Every day | ORAL | 3 refills | Status: AC

## 2023-10-15 NOTE — Progress Notes (Signed)
 10/15/2023 1:47 PM   Spencer Stanley 1960/12/17 [id number]  Referring provider: Roise Cleaver, MD [address] [place],  [place] [address]  No chief complaint on file.   HPI:  F/u -   1) gross hematuria-patient noted red urine Nov and Dec 2021. UA 12/21 showed greater than 30 red blood cells per high-powered field, urine cx no growth. He underwent an ultrasound 01/22 which was benign, possible 5 mm LLP (echogenic focus no shadowing). He is a smoker. Cr 0.78. CT and cystoscopy benign 03/22. 06/22 renal US  benign. He had one flash of blood in the urine again. UA with mod bac and 3-10 rbc.    2) BPH-prostate was 58 g on 01/22 renal/bladder ultrasound with some bladder wall thickening/trabeculation and about 50 g on CT 03/22. PSA 0.9 12/20. He has a weak stream, urgency and urge UI. No pad use. PVR was 13 ml and 31 ml. He started tamsulosin  02/22. Not much change in urgency. AUASS = 17, dissatisfied. Cysto 03/22, with lateral lobes - good 5ari, OAB med or PUL candidate. He continues to have some hesitancy. Rx'd 5ari June 2022 but pt declined due to risks of ED and the started taking it. AUASS was 16 on tams. He then started finasteride .    His March 2023 PSA was 1.1. He forgot the tamsulosin . He noticed a weaker stream. AUASS = 14.    3) left proximal ureteral stone - possible 4-5 mm left proximal stone on CT 03/22. No hydro or other stones. Vascular calcifications also present. 06/22 renal US  with no stone or hydro. KUB ordered not done. No flank pain.    4) ED - trouble getting and maintaining erection for several years. Rx for sildenafil  in 2024.   Today, seen for the above. Dec 2024 PSA 0.7. Rx for sildenafil  June 2024. He did not get it filled but is still interested in trying it. On tams and finasteride . Bothersome nocturia but good stream. No snoring. His Dec 2024 PSA 0.7 (1.4). No gross hematuria.    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 His BP is kept low as he's had varices rupture. He  stopped drinking. 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 His MELD is down to 8-9 - stable for 11 yrs.     PMH: Past Medical History:  Diagnosis Date   Anxiety    Cirrhosis (HCC)    Cirrhosis of liver (HCC)    Diabetes mellitus without complication (HCC)    Esophageal varices (HCC)    High cholesterol    Lyme disease    [place] spotted fever     Surgical History: Past Surgical History:  Procedure Laterality Date   ESOPHAGEAL VARICE LIGATION     TIPS   HERNIA REPAIR      Home Medications:  Allergies as of 10/15/2023       Reactions   Other Other (See Comments)   Other reaction(s): Mental Status Changes (intolerance) After medicated induced coma patient seeing things that weren't there.         Medication List        Accurate as of October 15, 2023  1:47 PM. If you have any questions, ask your nurse or doctor.          Accu-Chek Aviva Plus test strip Generic drug: glucose blood TEST BLOOD SUGAR THREE TIMES DAILY Dx E11.9   atorvastatin  10 MG tablet Commonly known as: LIPITOR TAKE 1 TABLET EVERY DAY  CALCIUM -MAGNESIUM-ZINC PO Take 1 capsule by mouth 2 (two) times daily.   DropSafe Alcohol Prep 70 % Pads TEST BLOOD SUGAR THREE TIMES DAILY Dx E11.9   finasteride  5 MG tablet Commonly known as: Proscar  Take 1 tablet (5 mg total) by mouth daily.   FLUoxetine  20 MG capsule Commonly known as: PROZAC  TAKE 1 CAPSULE EVERY DAY   lactulose  10 GM/15ML solution Commonly known as: CHRONULAC  Take 45 g by mouth 3 (three) times daily.   multivitamin Tabs tablet Take 1 tablet by mouth daily.   pantoprazole  40 MG tablet Commonly known as: PROTONIX  TAKE 1 TABLET EVERY DAY   rifaximin  550 MG Tabs tablet Commonly known as: XIFAXAN  Take 1 tablet (550 mg total) by mouth 2 (two) times daily.   sildenafil  20 MG tablet Commonly known as: REVATIO  Take 2-5 pills at once, orally, with each sexual encounter   tamsulosin  0.4  MG Caps capsule Commonly known as: FLOMAX  Take 1 capsule (0.4 mg total) by mouth 2 (two) times daily.   ZINC PO Take by mouth.   zinc sulfate (50mg  elemental zinc) 220 (50 Zn) MG capsule Take 1 capsule by mouth daily.        Allergies:  Allergies  Allergen Reactions   Other Other (See Comments)    Other reaction(s): Mental Status Changes (intolerance) After medicated induced coma patient seeing things that weren't there.     Family History: Family History  Problem Relation Age of Onset   Aneurysm Mother    Cancer Mother        breast   Dementia Father    Cancer Father        brain tumor   Heart failure Father    Diabetes Sister     Social History:  reports that he has been smoking cigarettes. He has a 16.5 pack-year smoking history. He quit smokeless tobacco use about 44 years ago.  His smokeless tobacco use included chew. He reports that he does not drink alcohol and does not use drugs.   Physical Exam: There were no vitals taken for this visit.  Constitutional:  Alert and oriented, No acute distress. HEENT: [place] AT, moist mucus membranes.  Trachea midline, no masses. Cardiovascular: No clubbing, cyanosis, or edema. Respiratory: Normal respiratory effort, no increased work of breathing. GI: Abdomen is soft, nontender, nondistended, no abdominal masses GU: No CVA tenderness Lymph: No cervical or inguinal lymphadenopathy. Skin: No rashes, bruises or suspicious lesions. Neurologic: Grossly intact, no focal deficits, moving all 4 extremities. Psychiatric: Normal mood and affect.  Laboratory Data: Lab Results  Component Value Date   WBC 4.1 04/17/2023   HGB 11.5 (L) 04/17/2023   HCT 37.5 04/17/2023   MCV 90 04/17/2023   PLT 84 (LL) 04/17/2023    Lab Results  Component Value Date   CREATININE 0.84 04/17/2023    No results found for: "PSA"  Lab Results  Component Value Date   TESTOSTERONE  913 06/20/2014    Lab Results  Component Value Date   HGBA1C  5.5 04/17/2023    Urinalysis    Component Value Date/Time   APPEARANCEUR Clear 10/30/2022 1503   GLUCOSEU Negative 10/30/2022 1503   BILIRUBINUR Negative 10/30/2022 1503   PROTEINUR Trace 10/30/2022 1503   NITRITE Negative 10/30/2022 1503   LEUKOCYTESUR 1+ (A) 10/30/2022 1503    Lab Results  Component Value Date   LABMICR See below: 10/30/2022   WBCUA 6-10 (A) 10/30/2022   LABEPIT 0-10 10/30/2022   MUCUS Present 07/19/2020   BACTERIA None  seen 10/30/2022    Pertinent Imaging: N/a  Results for orders placed during the hospital encounter of 10/14/20  US  RENAL  Narrative CLINICAL DATA:  Left ureteral stone.  EXAM: RENAL / URINARY TRACT ULTRASOUND COMPLETE  COMPARISON:  CT abdomen and pelvis 07/16/2020. Renal ultrasound 05/18/2020.  FINDINGS: Right Kidney:  Renal measurements: 11.6 x 6.0 x 5.9 cm = volume: 214 mL. Echogenicity within normal limits. No mass or hydronephrosis visualized.  Left Kidney:  Renal measurements: 11.3 x 5.2 x 6.0 cm = volume: 185 mL. Echogenicity within normal limits. No mass or hydronephrosis visualized.  Bladder:  Mild circumferential bladder wall thickening which may be at least in part due to underdistension.  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  IMPRESSION: 1. Unremarkable sonographic appearance of the kidneys. 2. Mild circumferential bladder wall thickening which may be at least in part due to underdistension although cystitis is also possible. 3. Splenomegaly.   Electronically Signed By: Aundra Lee M.D. On: 10/14/2020 13:51     Assessment & Plan:    BPH, nocturia - discussed trial of tadalafil  and checked meld / CP scores. Max 10 mg per day. Start usual 5 mg daily.   No follow-ups on file.  Christina Coyer, MD  [facility]  [address] [place], [place] [address] [phone]

## 2023-10-16 ENCOUNTER — Encounter: Payer: Self-pay | Admitting: Urology

## 2023-10-17 ENCOUNTER — Telehealth: Payer: Self-pay

## 2023-10-17 ENCOUNTER — Ambulatory Visit (INDEPENDENT_AMBULATORY_CARE_PROVIDER_SITE_OTHER): Payer: Medicare HMO | Admitting: Family Medicine

## 2023-10-17 ENCOUNTER — Encounter: Payer: Self-pay | Admitting: Family Medicine

## 2023-10-17 VITALS — BP 101/64 | HR 98 | Temp 97.4°F | Ht 69.0 in | Wt 151.0 lb

## 2023-10-17 DIAGNOSIS — E782 Mixed hyperlipidemia: Secondary | ICD-10-CM

## 2023-10-17 DIAGNOSIS — K219 Gastro-esophageal reflux disease without esophagitis: Secondary | ICD-10-CM | POA: Diagnosis not present

## 2023-10-17 DIAGNOSIS — K7030 Alcoholic cirrhosis of liver without ascites: Secondary | ICD-10-CM

## 2023-10-17 DIAGNOSIS — E119 Type 2 diabetes mellitus without complications: Secondary | ICD-10-CM | POA: Diagnosis not present

## 2023-10-17 MED ORDER — ATORVASTATIN CALCIUM 10 MG PO TABS: 10.0000 mg | ORAL_TABLET | Freq: Every day | ORAL | 1 refills | Status: DC

## 2023-10-17 NOTE — Progress Notes (Signed)
 Subjective:  Patient ID: Spencer Stanley, male    DOB: 02/28/1961  Age: 63 y.o. MRN: [id number]  CC: Weight Loss (Losing weight without trying. Does admit that due to work schedule he eats intermittently. ) and Results (Urology states lab results are abnormal but would like to discuss with you. )   HPI Spencer Stanley presents for concern about weight, but appetite is good. HE is a cirrhosis patient. Stable recently. Taking xifaxan  to control diarrhea and lactulose  to control ammonia. Due for AFP level and ammonia level. Weight has varied around 160 over the last 5 years on chart review. It has been as high as 175 but this was brief. Weight 3 years ago was 160. Last visit was 156.  Patient in for follow-up of GERD. Currently asymptomatic taking  PPI daily. There is no chest pain or heartburn. No hematemesis and no melena. No dysphagia or choking. Onset is remote. Progression is stable. Complicating factors, none.  Urology note reviewed. No clear lab abnormalities revealed.      10/17/2023    1:12 PM 04/17/2023    2:07 PM 04/17/2023    1:53 PM  Depression screen PHQ 2/9  Decreased Interest 1 1 0  Down, Depressed, Hopeless 1 1 0  PHQ - 2 Score 2 2 0  Altered sleeping 1 0   Tired, decreased energy 1 1   Change in appetite 1 0   Feeling bad or failure about yourself  1 1   Trouble concentrating 0 0   Moving slowly or fidgety/restless 0 1   Suicidal thoughts 0 0   PHQ-9 Score 6 5   Difficult doing work/chores Not difficult at all Not difficult at all     History Spencer Stanley has a past medical history of Anxiety, Cirrhosis (HCC), Cirrhosis of liver (HCC), Diabetes mellitus without complication (HCC), Esophageal varices (HCC), High cholesterol, Lyme disease, and [facility] spotted fever.   He has a past surgical history that includes Hernia repair and Esophageal varice ligation.   His family history includes Aneurysm in his mother; Cancer in his father and mother; Dementia in his father; Diabetes in  his sister; Heart failure in his father.He reports that he has been smoking cigarettes. He has a 16.5 pack-year smoking history. He quit smokeless tobacco use about 44 years ago.  His smokeless tobacco use included chew. He reports that he does not drink alcohol and does not use drugs.    ROS Review of Systems  Objective:  BP 101/64   Pulse 98   Temp (!) 97.4 F (36.3 C)   Ht 5\' 9"  (1.753 m)   Wt 151 lb (68.5 kg)   SpO2 97%   BMI 22.30 kg/m   BP Readings from Last 3 Encounters:  10/17/23 101/64  10/15/23 101/70  04/17/23 (!) 84/50    Wt Readings from Last 3 Encounters:  10/17/23 151 lb (68.5 kg)  04/17/23 164 lb (74.4 kg)  03/23/23 156 lb (70.8 kg)     Physical Exam   Assessment & Plan:  Alcoholic cirrhosis of liver without ascites (HCC) -     CBC with Differential/Platelet -     CMP14+EGFR -     Lipid panel -     Ammonia -     Alpha fetoprotein, amniotic  Gastroesophageal reflux disease, unspecified whether esophagitis present -     CBC with Differential/Platelet -     CMP14+EGFR  Mixed hyperlipidemia -     CBC with Differential/Platelet -  CMP14+EGFR -     Lipid panel  Type 2 diabetes mellitus without complication, without long-term current use of insulin  (HCC) -     CBC with Differential/Platelet -     CMP14+EGFR  Other orders -     Atorvastatin  Calcium ; Take 1 tablet (10 mg total) by mouth daily.  Dispense: 90 tablet; Refill: 1   Continue to monitor weight loss. If continues, will do neoplastic work up.   Follow-up: Return in about 6 months (around 04/17/2024) for [facility] physical.  Roise Cleaver, M.D.

## 2023-10-17 NOTE — Telephone Encounter (Signed)
 Medication prior authorization request received.  Completed PA request through cover my meds for drug Tadalafil . KEY: [id number]  Approved: Pending

## 2023-10-18 DIAGNOSIS — K7682 Hepatic encephalopathy: Secondary | ICD-10-CM | POA: Diagnosis not present

## 2023-10-18 DIAGNOSIS — R296 Repeated falls: Secondary | ICD-10-CM | POA: Diagnosis not present

## 2023-10-18 DIAGNOSIS — R270 Ataxia, unspecified: Secondary | ICD-10-CM | POA: Diagnosis not present

## 2023-10-18 DIAGNOSIS — F1021 Alcohol dependence, in remission: Secondary | ICD-10-CM | POA: Diagnosis not present

## 2023-10-18 LAB — LIPID PANEL
Chol/HDL Ratio: 2.9 ratio (ref 0.0–5.0)
Cholesterol, Total: 103 mg/dL (ref 100–199)
HDL: 36 mg/dL — ABNORMAL LOW (ref >39)
LDL Chol Calc (NIH): 50 mg/dL (ref 0–99)
Triglycerides: 86 mg/dL (ref 0–149)
VLDL Cholesterol Cal: 17 mg/dL (ref 5–40)

## 2023-10-18 LAB — AMMONIA: Ammonia: 101 ug/dL (ref 40–200)

## 2023-10-18 LAB — CBC WITH DIFFERENTIAL/PLATELET
Basophils Absolute: 0 10*3/uL (ref 0.0–0.2)
Basos: 1 %
EOS (ABSOLUTE): 0.1 10*3/uL (ref 0.0–0.4)
Eos: 3 %
Hematocrit: 38.3 % (ref 37.5–51.0)
Hemoglobin: 12.3 g/dL — ABNORMAL LOW (ref 13.0–17.7)
Immature Grans (Abs): 0 10*3/uL (ref 0.0–0.1)
Immature Granulocytes: 0 %
Lymphocytes Absolute: 0.7 10*3/uL (ref 0.7–3.1)
Lymphs: 22 %
MCH: 28.3 pg (ref 26.6–33.0)
MCHC: 32.1 g/dL (ref 31.5–35.7)
MCV: 88 fL (ref 79–97)
Monocytes Absolute: 0.4 10*3/uL (ref 0.1–0.9)
Monocytes: 12 %
Neutrophils Absolute: 2 10*3/uL (ref 1.4–7.0)
Neutrophils: 62 %
Platelets: 68 10*3/uL — CL (ref 150–450)
RBC: 4.34 x10E6/uL (ref 4.14–5.80)
RDW: 14.9 % (ref 11.6–15.4)
WBC: 3.3 10*3/uL — ABNORMAL LOW (ref 3.4–10.8)

## 2023-10-18 LAB — CMP14+EGFR
ALT: 19 IU/L (ref 0–44)
AST: 26 IU/L (ref 0–40)
Albumin: 4 g/dL (ref 3.9–4.9)
Alkaline Phosphatase: 104 IU/L (ref 44–121)
BUN/Creatinine Ratio: 18 (ref 10–24)
BUN: 15 mg/dL (ref 8–27)
Bilirubin Total: 0.6 mg/dL (ref 0.0–1.2)
CO2: 17 mmol/L — ABNORMAL LOW (ref 20–29)
Calcium: 9.2 mg/dL (ref 8.6–10.2)
Chloride: 111 mmol/L — ABNORMAL HIGH (ref 96–106)
Creatinine, Ser: 0.84 mg/dL (ref 0.76–1.27)
Globulin, Total: 2.2 g/dL (ref 1.5–4.5)
Glucose: 135 mg/dL — ABNORMAL HIGH (ref 70–99)
Potassium: 4 mmol/L (ref 3.5–5.2)
Sodium: 142 mmol/L (ref 134–144)
Total Protein: 6.2 g/dL (ref 6.0–8.5)
eGFR: 98 mL/min/{1.73_m2} (ref >59)

## 2023-10-20 ENCOUNTER — Ambulatory Visit: Payer: Self-pay | Admitting: Family Medicine

## 2023-11-12 ENCOUNTER — Encounter: Payer: Self-pay | Admitting: *Deleted

## 2023-11-12 NOTE — Progress Notes (Signed)
 Patient notified via mail of LDCT lung cancer screening results with recommendations to follow up in 12 months.  Also notified of incidental findings and need to follow up with PCP.  Patient's referring provider was sent a copy of results.     IMPRESSION: 1. Lung-RADS 1, negative. Continue annual screening with low-dose chest CT without contrast in 12 months. 2. Cirrhosis and cholelithiasis. 3. Aortic Atherosclerosis (ICD10-I70.0) and Emphysema (ICD10-J43.9). Coronary artery atherosclerosis.

## 2023-11-28 ENCOUNTER — Other Ambulatory Visit: Payer: Self-pay | Admitting: Family Medicine

## 2024-01-21 DIAGNOSIS — K7460 Unspecified cirrhosis of liver: Secondary | ICD-10-CM | POA: Diagnosis not present

## 2024-01-21 DIAGNOSIS — K7290 Hepatic failure, unspecified without coma: Secondary | ICD-10-CM | POA: Diagnosis not present

## 2024-02-05 DIAGNOSIS — K7460 Unspecified cirrhosis of liver: Secondary | ICD-10-CM | POA: Diagnosis not present

## 2024-02-05 DIAGNOSIS — R161 Splenomegaly, not elsewhere classified: Secondary | ICD-10-CM | POA: Diagnosis not present

## 2024-02-29 LAB — HEMOGLOBIN A1C: A1c: 5.6

## 2024-03-25 ENCOUNTER — Ambulatory Visit: Payer: Medicare HMO

## 2024-03-25 VITALS — BP 101/69 | HR 98 | Ht 69.0 in | Wt 151.0 lb

## 2024-03-25 DIAGNOSIS — Z0000 Encounter for general adult medical examination without abnormal findings: Secondary | ICD-10-CM

## 2024-03-25 NOTE — Progress Notes (Signed)
 Subjective:   Spencer Stanley is a 63 y.o. male who presents for a Medicare Annual Wellness Visit.  Allergies (verified) Other   History: Past Medical History:  Diagnosis Date   Anxiety    Cirrhosis (HCC)    Cirrhosis of liver (HCC)    Diabetes mellitus without complication (HCC)    Esophageal varices (HCC)    High cholesterol    Lyme disease    [place] spotted fever    Past Surgical History:  Procedure Laterality Date   ESOPHAGEAL VARICE LIGATION     TIPS   HERNIA REPAIR     Family History  Problem Relation Age of Onset   Aneurysm Mother    Cancer Mother        breast   Dementia Father    Cancer Father        brain tumor   Heart failure Father    Diabetes Sister    Social History   Occupational History   Occupation: disability  Tobacco Use   Smoking status: Every Day    Current packs/day: 0.50    Average packs/day: 0.5 packs/day for 33.0 years (16.5 ttl pk-yrs)    Types: Cigarettes   Smokeless tobacco: Former    Types: Chew    Quit date: 1981   Tobacco comments:    03/21/2018 cut back to 1/2 ppd  Vaping Use   Vaping status: Never Used  Substance and Sexual Activity   Alcohol use: No    Comment: no alcohol since 2013   Drug use: No   Sexual activity: Yes   Tobacco Counseling Ready to quit: No Counseling given: Yes Tobacco comments: 03/21/2018 cut back to 1/2 ppd  SDOH Screenings   Food Insecurity: No Food Insecurity (03/25/2024)  Housing: Unknown (03/25/2024)  Transportation Needs: No Transportation Needs (03/25/2024)  Utilities: Not At Risk (03/25/2024)  Alcohol Screen: Low Risk  (03/23/2023)  Depression (PHQ2-9): Low Risk  (03/25/2024)  Financial Resource Strain: Low Risk  (03/23/2023)  Physical Activity: Sufficiently Active (03/25/2024)  Social Connections: Moderately Isolated (03/25/2024)  Stress: No Stress Concern Present (03/25/2024)  Tobacco Use: High Risk (03/25/2024)  Health Literacy: Adequate Health Literacy (03/25/2024)    Depression Screen    03/25/2024   10:38 AM 10/17/2023    1:12 PM 04/17/2023    2:07 PM 04/17/2023    1:53 PM 03/23/2023    2:51 PM 10/16/2022    1:33 PM 03/21/2022    2:54 PM  PHQ 2/9 Scores  PHQ - 2 Score 0 2 2 0 0 2 0  PHQ- 9 Score  6  5    3        Data saved with a previous flowsheet row definition      Goals Addressed             This Visit's Progress    Patient Stated   On track    03/21/2022 AWV Goal: Exercise for General Health  Patient will verbalize understanding of the benefits of increased physical activity: Exercising regularly is important. It will improve your overall fitness, flexibility, and endurance. Regular exercise also will improve your overall health. It can help you control your weight, reduce stress, and improve your bone density. Over the next year, patient will increase physical activity as tolerated with a goal of at least 150 minutes of moderate physical activity per week.  You can tell that you are exercising at a moderate intensity if your heart starts beating faster and you start breathing faster but  can still hold a conversation. Moderate-intensity exercise ideas include: Walking 1 mile (1.6 km) in about 15 minutes Biking Hiking Golfing Dancing Water aerobics Patient will verbalize understanding of everyday activities that increase physical activity by providing examples like the following: Yard work, such as: Insurance Underwriter Gardening Washing windows or floors Patient will be able to explain general safety guidelines for exercising:  Before you start a new exercise program, talk with your health care provider. Do not exercise so much that you hurt yourself, feel dizzy, or get very short of breath. Wear comfortable clothes and wear shoes with good support. Drink plenty of water while you exercise to prevent dehydration or heat stroke. Work out until your  breathing and your heartbeat get faster.        Visit info / Clinical Intake: Medicare Wellness Visit Type:: Subsequent Annual Wellness Visit Persons participating in visit:: patient Medicare Wellness Visit Mode:: Telephone Because this visit was a virtual/telehealth visit:: vitals recorded from last visit If Telephone or Video please confirm:: I connected with the patient using audio enabled telemedicine application and verified that I am speaking with the correct person using two identifiers Patient Location:: home Provider Location:: home office Information given by:: patient Interpreter Needed?: No Pre-visit prep was completed: yes AWV questionnaire completed by patient prior to visit?: no Living arrangements:: lives with spouse/significant other Typical amount of pain: none Does pain affect daily life?: no Are you currently prescribed opioids?: no  Dietary Habits and Nutritional Risks How many meals a day?: 2 Eats fruit and vegetables daily?: yes Most meals are obtained by: preparing own meals In the last 2 weeks, have you had any of the following?: none Diabetic:: no  Functional Status Activities of Daily Living (to include ambulation/medication): Independent Ambulation: Independent Medication Administration: Independent Home Management: Independent Manage your own finances?: yes Primary transportation is: driving Concerns about hearing?: no  Fall Screening Falls in the past year?: 0 Number of falls in past year: 0 Was there an injury with Fall?: 0 Fall Risk Category Calculator: 0 Patient Fall Risk Level: Low Fall Risk  Fall Risk Patient at Risk for Falls Due to: No Fall Risks Fall risk Follow up: Falls evaluation completed; Education provided  Home and Transportation Safety: All rugs have non-skid backing?: (!) no All stairs or steps have railings?: N/A, no stairs Grab bars in the bathtub or shower?: (!) no Have non-skid surface in bathtub or shower?:  yes Good home lighting?: yes Regular seat belt use?: yes Hospital stays in the last year:: no  Cognitive Assessment Difficulty concentrating, remembering, or making decisions? : yes Will 6CIT or Mini Cog be Completed: yes What year is it?: 0 points What month is it?: 0 points Give patient an address phrase to remember (5 components): [address] [place], [place] About what time is it?: 0 points Count backwards from 20 to 1: 0 points Say the months of the year in reverse: 0 points Repeat the address phrase from earlier: 0 points 6 CIT Score: 0 points  Advance Directives (For Healthcare) Does Patient Have a Medical Advance Directive?: No Would patient like information on creating a medical advance directive?: No - Patient declined  Reviewed/Updated  Reviewed/Updated: Reviewed All (Medical, Surgical, Family, Medications, Allergies, Care Teams, Patient Goals)        Objective:    Today's Vitals   03/25/24 1028  BP: 101/69  Pulse: 98  Weight: 151 lb (  68.5 kg)  Height: 5' 9 (1.753 m)   Body mass index is 22.3 kg/m.  Current Medications (verified) Outpatient Encounter Medications as of 03/25/2024  Medication Sig   Accu-Chek Softclix Lancets lancets TEST BLTEST BLOOD SUGAR THREE TIMES DAILY Dx E11.9   Alcohol Swabs (DROPSAFE ALCOHOL PREP) 70 % PADS TEST BLOOD SUGAR THREE TIMES DAILY Dx E11.9   atorvastatin  (LIPITOR) 10 MG tablet Take 1 tablet (10 mg total) by mouth daily.   CALCIUM -MAGNESIUM-ZINC PO Take 1 capsule by mouth 2 (two) times daily.   finasteride  (PROSCAR ) 5 MG tablet Take 1 tablet (5 mg total) by mouth daily.   FLUoxetine  (PROZAC ) 20 MG capsule TAKE 1 CAPSULE EVERY DAY   glucose blood (ACCU-CHEK AVIVA PLUS) test strip TEST BLOOD SUGAR THREE TIMES DAILY Dx E11.9   lactulose  (CHRONULAC ) 10 GM/15ML solution Take 45 g by mouth 3 (three) times daily.   Multiple Vitamins-Minerals (ZINC PO) Take by mouth.   multivitamin (ONE-A-DAY MEN'S) TABS tablet Take 1 tablet by mouth  daily.   pantoprazole  (PROTONIX ) 40 MG tablet TAKE 1 TABLET EVERY DAY   rifaximin  (XIFAXAN ) 550 MG TABS tablet Take 1 tablet (550 mg total) by mouth 2 (two) times daily.   tadalafil  (CIALIS ) 5 MG tablet Take 1 tablet (5 mg total) by mouth daily.   tamsulosin  (FLOMAX ) 0.4 MG CAPS capsule Take 1 capsule (0.4 mg total) by mouth 2 (two) times daily.   zinc sulfate 220 (50 Zn) MG capsule Take 1 capsule by mouth daily.   No facility-administered encounter medications on file as of 03/25/2024.   Hearing/Vision screen Hearing Screening - Comments:: Pt denies hearing dif Vision Screening - Comments:: Pt wear glasses/pt goes to [facility] in [place] mos ago Immunizations and Health Maintenance Health Maintenance  Topic Date Due   Hepatitis B Vaccines 19-59 Average Risk (2 of 3 - Risk 3-dose series) 05/24/2018   FOOT EXAM  02/01/2022   Diabetic kidney evaluation - Urine ACR  03/17/2023   HEMOGLOBIN A1C  10/16/2023   Influenza Vaccine  12/14/2023   COVID-19 Vaccine (4 - 2025-26 season) 01/14/2024   DTaP/Tdap/Td (1 - Tdap) 10/16/2024 (Originally 11/08/1979)   OPHTHALMOLOGY EXAM  10/05/2024   Diabetic kidney evaluation - eGFR measurement  10/16/2024   Pneumococcal Vaccine: 50+ Years (3 of 3 - PCV20 or PCV21) 11/02/2024   Medicare Annual Wellness (AWV)  03/25/2025   Colonoscopy  09/22/2025   Hepatitis C Screening  Completed   HIV Screening  Completed   Zoster Vaccines- Shingrix   Completed   HPV VACCINES  Aged Out   Meningococcal B Vaccine  Aged Out        Assessment/Plan:  This is a routine wellness examination for Spencer Stanley.  Patient Care Team: Zollie Lowers, MD as PCP - General (Family Medicine) March Alyce SAUNDERS, MD as Referring Physician (Gastroenterology) Jesus Oliphant, MD as Consulting Physician (Otolaryngology) Vicci Mcardle, OD (Optometry) Nieves Cough, MD as Consulting Physician (Urology)  I have personally reviewed and noted the following in the patient's chart:    Medical and social history Use of alcohol, tobacco or illicit drugs  Current medications and supplements including opioid prescriptions. Functional ability and status Nutritional status Physical activity Advanced directives List of other physicians Hospitalizations, surgeries, and ER visits in previous 12 months Vitals Screenings to include cognitive, depression, and falls Referrals and appointments  No orders of the defined types were placed in this encounter.  In addition, I have reviewed and discussed with patient certain preventive protocols, quality metrics, and best  practice recommendations. A written personalized care plan for preventive services as well as general preventive health recommendations were provided to patient.   Ozie Ned, CMA   03/25/2024   Return in 1 year (on 03/25/2025).  After Visit Summary: (MyChart) Due to this being a telephonic visit, the after visit summary with patients personalized plan was offered to patient via MyChart   Nurse Notes: PCP follow up: pt is aware and due the following: covid and flu vaccines, foot exam, diabetic eye exam, UrineACR, A1c

## 2024-04-09 ENCOUNTER — Other Ambulatory Visit: Payer: Self-pay | Admitting: Family Medicine

## 2024-04-17 ENCOUNTER — Encounter: Payer: Self-pay | Admitting: Family Medicine

## 2024-04-21 ENCOUNTER — Encounter: Payer: Self-pay | Admitting: Family Medicine

## 2024-05-29 ENCOUNTER — Other Ambulatory Visit: Payer: Self-pay

## 2024-05-29 DIAGNOSIS — Z122 Encounter for screening for malignant neoplasm of respiratory organs: Secondary | ICD-10-CM

## 2024-05-29 DIAGNOSIS — Z87891 Personal history of nicotine dependence: Secondary | ICD-10-CM

## 2024-05-29 DIAGNOSIS — F17210 Nicotine dependence, cigarettes, uncomplicated: Secondary | ICD-10-CM

## 2024-06-17 ENCOUNTER — Encounter: Admitting: Family Medicine

## 2024-07-24 ENCOUNTER — Encounter: Admitting: Family Medicine

## 2025-03-27 ENCOUNTER — Ambulatory Visit
